# Patient Record
Sex: Female | Born: 1955 | Race: White | Hispanic: No | Marital: Married | Smoking: Never smoker
Health system: Southern US, Community
[De-identification: ages and names within clinical notes are randomized; demographics above are authoritative.]

## PROBLEM LIST (undated history)

## (undated) DIAGNOSIS — E1139 Type 2 diabetes mellitus with other diabetic ophthalmic complication: Secondary | ICD-10-CM

## (undated) DIAGNOSIS — M8580 Other specified disorders of bone density and structure, unspecified site: Secondary | ICD-10-CM

## (undated) DIAGNOSIS — R519 Headache, unspecified: Secondary | ICD-10-CM

## (undated) DIAGNOSIS — K5790 Diverticulosis of intestine, part unspecified, without perforation or abscess without bleeding: Secondary | ICD-10-CM

## (undated) DIAGNOSIS — E349 Endocrine disorder, unspecified: Secondary | ICD-10-CM

## (undated) DIAGNOSIS — E669 Obesity, unspecified: Secondary | ICD-10-CM

## (undated) DIAGNOSIS — H539 Unspecified visual disturbance: Secondary | ICD-10-CM

## (undated) DIAGNOSIS — G629 Polyneuropathy, unspecified: Secondary | ICD-10-CM

## (undated) DIAGNOSIS — N182 Chronic kidney disease, stage 2 (mild): Secondary | ICD-10-CM

## (undated) DIAGNOSIS — I10 Essential (primary) hypertension: Secondary | ICD-10-CM

## (undated) DIAGNOSIS — E119 Type 2 diabetes mellitus without complications: Secondary | ICD-10-CM

## (undated) DIAGNOSIS — K635 Polyp of colon: Secondary | ICD-10-CM

## (undated) DIAGNOSIS — G63 Polyneuropathy in diseases classified elsewhere: Secondary | ICD-10-CM

## (undated) DIAGNOSIS — K589 Irritable bowel syndrome without diarrhea: Secondary | ICD-10-CM

## (undated) DIAGNOSIS — R51 Headache: Secondary | ICD-10-CM

## (undated) DIAGNOSIS — H47099 Other disorders of optic nerve, not elsewhere classified, unspecified eye: Secondary | ICD-10-CM

## (undated) DIAGNOSIS — F329 Major depressive disorder, single episode, unspecified: Secondary | ICD-10-CM

## (undated) HISTORY — DX: Polyneuropathy, unspecified: G62.9

## (undated) HISTORY — DX: Irritable bowel syndrome, unspecified: K58.9

## (undated) HISTORY — DX: Type 2 diabetes mellitus with other diabetic ophthalmic complication: E11.39

## (undated) HISTORY — DX: Chronic kidney disease, stage 2 (mild): N18.2

## (undated) HISTORY — DX: Headache, unspecified: R51.9

## (undated) HISTORY — DX: Headache: R51

## (undated) HISTORY — DX: Polyp of colon: K63.5

## (undated) HISTORY — DX: Other specified disorders of bone density and structure, unspecified site: M85.80

## (undated) HISTORY — DX: Diverticulosis of intestine, part unspecified, without perforation or abscess without bleeding: K57.90

## (undated) HISTORY — DX: Other disorders of optic nerve, not elsewhere classified, unspecified eye: H47.099

## (undated) HISTORY — DX: Endocrine disorder, unspecified: E34.9

## (undated) HISTORY — DX: Unspecified visual disturbance: H53.9

## (undated) HISTORY — DX: Polyneuropathy in diseases classified elsewhere: G63

## (undated) HISTORY — PX: OTHER SURGICAL HISTORY: SHX169

---

## 2002-03-31 ENCOUNTER — Ambulatory Visit (HOSPITAL_COMMUNITY): Admission: RE | Admit: 2002-03-31 | Discharge: 2002-03-31 | Payer: Self-pay | Admitting: Family Medicine

## 2002-03-31 ENCOUNTER — Encounter: Payer: Self-pay | Admitting: Family Medicine

## 2002-04-24 ENCOUNTER — Emergency Department (HOSPITAL_COMMUNITY): Admission: EM | Admit: 2002-04-24 | Discharge: 2002-04-25 | Payer: Self-pay | Admitting: Emergency Medicine

## 2002-04-25 ENCOUNTER — Encounter: Payer: Self-pay | Admitting: Emergency Medicine

## 2004-03-25 ENCOUNTER — Emergency Department (HOSPITAL_COMMUNITY): Admission: EM | Admit: 2004-03-25 | Discharge: 2004-03-25 | Payer: Self-pay | Admitting: Emergency Medicine

## 2004-03-25 ENCOUNTER — Inpatient Hospital Stay (HOSPITAL_COMMUNITY): Admission: EM | Admit: 2004-03-25 | Discharge: 2004-04-05 | Payer: Self-pay | Admitting: Psychiatry

## 2004-03-25 ENCOUNTER — Ambulatory Visit: Payer: Self-pay | Admitting: Psychiatry

## 2004-03-30 ENCOUNTER — Emergency Department (HOSPITAL_COMMUNITY): Admission: EM | Admit: 2004-03-30 | Discharge: 2004-03-31 | Payer: Self-pay

## 2007-08-09 ENCOUNTER — Emergency Department (HOSPITAL_COMMUNITY): Admission: EM | Admit: 2007-08-09 | Discharge: 2007-08-10 | Payer: Self-pay | Admitting: Emergency Medicine

## 2009-06-10 ENCOUNTER — Encounter: Admission: RE | Admit: 2009-06-10 | Discharge: 2009-06-10 | Payer: Self-pay | Admitting: Family Medicine

## 2009-06-21 ENCOUNTER — Encounter: Admission: RE | Admit: 2009-06-21 | Discharge: 2009-06-21 | Payer: Self-pay | Admitting: Family Medicine

## 2010-01-25 ENCOUNTER — Encounter: Admission: RE | Admit: 2010-01-25 | Discharge: 2010-01-25 | Payer: Self-pay | Admitting: Family Medicine

## 2010-08-11 ENCOUNTER — Other Ambulatory Visit: Payer: Self-pay | Admitting: Family Medicine

## 2010-08-11 DIAGNOSIS — N6310 Unspecified lump in the right breast, unspecified quadrant: Secondary | ICD-10-CM

## 2010-08-30 ENCOUNTER — Ambulatory Visit
Admission: RE | Admit: 2010-08-30 | Discharge: 2010-08-30 | Disposition: A | Discharge status: Home / Self Care | Payer: No Typology Code available for payment source | Source: Ambulatory Visit | Attending: Family Medicine | Admitting: Family Medicine

## 2010-08-30 DIAGNOSIS — N6310 Unspecified lump in the right breast, unspecified quadrant: Secondary | ICD-10-CM

## 2010-12-01 NOTE — Discharge Summary (Signed)
[name], [name] NO.:  [id number]   MEDICAL RECORD NO.:  [id number]          PATIENT TYPE:  IPS   LOCATION:  0306                          FACILITY:  [facility]   PHYSICIAN:  Jeanice Lim, M.D. DATE OF BIRTH:  04/13/1956   DATE OF ADMISSION:  03/25/2004  DATE OF DISCHARGE:  04/05/2004                                 DISCHARGE SUMMARY   IDENTIFYING DATA:  This is a 55 year old married Caucasian female,  voluntarily admitted, crying, reporting that she would never hurt herself  but felt so bad. Husband had had another affair. They are in bankruptcy.  Oldest son, age 6, just had a baby with a 79 year old girl. Multiple  psychosocial stressors. Went to primary care physician in [facility], followed by Dr. Andrey Campanile. Dr. Duanne Guess was working yesterday. Effexor  was increased. The patient admitted to feeling helpless, hopeless, and  worthless. The patient reported some suicidal thoughts, and she was referred  for admission. The patient had no prior inpatient psychiatric treatment. Had  been prescribed antidepressants a couple of times since age 21 but quit them  before getting response.   ALLERGIES:  RELAFEN.   PHYSICAL EXAMINATION:  NEUROLOGICAL:  Essentially within normal limits.   MENTAL STATUS EXAM:  Alert and oriented, somewhat unkempt, obese, normal  gait, good eye contact. Speech within normal limits. Mood depressed but  labile. Openly crying. Thought processes revealed no delusions or paranoia,  feeling betrayed. Concentration intact. Judgment and insight intact.  Intelligence average. Denied suicidal or homicidal ideation. Denied auditory  visual hallucinations.   ADMISSION DIAGNOSES:   AXIS I:  Major depressive disorder versus adjustment disorder with mixed  emotion.   AXIS II:  Deferred.   AXIS III:  1.  Hypertension.  2.  Obesity.  3.  Probable metabolic syndrome.  4.  Probably perimenopausal.   AXIS IV:  Severe problems with  primary support group and financial stress.   AXIS V:  30/60.   HOSPITAL COURSE:  The patient was admitted and ordered routine p.r.n.  medications and underwent further monitoring. Encouraged to participate in  individual, group, and milieu therapy. Was placed on safety checks and  resumed on Effexor and Xanax and medical medications. Effexor was decreased  to 150 mg which was still an increase just prior to admission, and Xanax was  adjusted to control acute anxiety. The patient was recommended for family  meeting. Husband  and affair appear to be trigger. The patient clearly  needed individual therapy, psychiatry followup, and possible  marital  counseling. Family session was held. The patient required p.r.n. Xanax for  severe anxiety. Lamictal was then started after clear history of the bottom  dropping out with the mood instability and recurrent depression and possible  mixed symptoms as well as Risperdal for generalized suspiciousness. The  patient reported wanting to die, was able to contract for safety, reported  continued suicidal ideation with urge to die. The patient required one to  one for suicidal ideation, would not contract yesterday. Interactions with  husband appeared to worsen patient's reactivity and feeling of  desperateness. The  patient felt very hurt and difficulty with moving on  after husband's past affair and then repeated breaking of trust. The patient  gradually showed improvement in mood, sleep, and more appropriate behavior,  participating more in treatment, able to problem solve and was able to focus  more on getting herself stable and healthy and then working on marital  issues. The patient tolerated the medication changes well, showed clear  improvement, and was discharged with a euthymic mood, brighter affect, no  dangerous ideation, no psychotic symptoms. Motivation to focus on problem  solving and getting herself healthy and stable, then making bigger  decisions  regarding marriage.   The patient was discharged in improved condition with no dangerous ideations  or psychotic symptoms on  1.  Tarka 2/180 one daily.  2.  Effexor XR 150 mg q.a.m.  3.  Xanax 0.25 mg 1 at 9, 4, and before bedtime.  4.  __________ 5 mg 1 at 9 p.m.   DISPOSITION:  The patient had been scheduled for a Cardiolite stress test on  September 19, seen by internal medicine and was advised not to eat or drink  starting at midnight. The patient again was discharged in improved condition  with no risk issues.   DISCHARGE DIAGNOSES:   AXIS I:  Major depressive disorder versus adjustment disorder with mixed  emotion.   AXIS II:  Deferred.   AXIS III:  1.  Hypertension.  2.  Obesity.  3.  Probable metabolic syndrome.  4.  Probably perimenopausal.   AXIS IV:  Severe problems with primary support group and financial stress.   AXIS V:  55.     [name]   JEM/MEDQ  D:  05/01/2004  T:  05/02/2004  Job:  [id number]

## 2010-12-01 NOTE — H&P (Signed)
NAME:  [name], [name]                        ACCOUNT NO.:  [id number]   MEDICAL RECORD NO.:  [id number]                   PATIENT TYPE:  IPS   LOCATION:  0306                                 FACILITY:  [facility]   PHYSICIAN:  Geoffery Lyons, M.D.                   DATE OF BIRTH:  Jun 06, 1956   DATE OF ADMISSION:  03/25/2004  DATE OF DISCHARGE:                         PSYCHIATRIC ADMISSION ASSESSMENT   IDENTIFYING INFORMATION:  This is a 55 year old married white female.  She  is here on a voluntary basis.  She is crying.  She says she never would have  hurt herself, just felt so bad.  Her husband had had another affair.  They  are in bankruptcy.  Her oldest son, age 6, just had a baby with a 15-year-  old girl, etc.  Apparently yesterday she went to her primary care Tambra Muller,  [facility], where ordinarily she is followed by Dr. Andrey Campanile,  however Dr. Duanne Guess was on yesterday, and while collaborating to increase her  Effexor XR 75 mg to 150 mg p.o. daily the patient admitted feeling hopeless,  helpless, worthless, and amenable to doing something to harm herself.  Dr.  Duanne Guess became concerned, told the patient that if she did not sign in  voluntarily she would take papers out on her.  The patient then decided to  come in.  Apparently since becoming 41ish the patient has had several trials  of antidepressants.  She states that in the past she has taken Zoloft and  Prozac and again for similar, very distressing life situations.  An 8-year-  old niece died of cancer, her father was ill, a variety of life stressors  knock her off balance.  Apparently after beginning to respond to these prior  agents, she would stop taking them.  Currently her sleep is not good and she  is also reporting panic attacks when she tries to go to bed at night.   PAST PSYCHIATRIC HISTORY:  She has had no actual inpatient treatment.  As  already stated, she has been prescribed antidepressants a couple of  times  since age 21, however she would quit them before attaining full benefit.   SOCIAL HISTORY:  She graduated high school, she worked in an M.D.'s office  for 10 years.  She has not been employed since 1997.  She has a 67 year old  son, a 16 year old daughter, and a 33 year old son.   FAMILY HISTORY:  Her father and several other family members are all on  antidepressants.   ALCOHOL AND DRUG ABUSE:  She has no history for this.  Her urine drug screen  was positive for benzodiazepines.  She is prescribed Xanax.  Her urine  alcohol was negative.   PAST MEDICAL HISTORY:  Primary care Icarus Partch is Dr. Andrey Campanile and now Dr. Duanne Guess  at [facility]  She is followed for hypertension.  She is  prescribed Tarka 2/180  p.o. daily and Effexor XR 75 mg p.o. daily.  She was  also prescribed Xanax 0.5 mg t.i.d. although she was only taking it the hour  of sleep for panic attacks.   ALLERGIES:  RELAFEN, she gets hives.   POSITIVE PHYSICAL FINDINGS:  PHYSICAL EXAMINATION:  This is an obese white  female.  The remainder of her physical examination is well documented from  the ER and on the chart.   MENTAL STATUS EXAM:  She is alert and oriented x3.  She is somewhat unkempt,  obese, normal gait and motor, good eye contact.  Her speech is a normal  rate, rhythm and tone.  Her mood is depressed.  She is labile.  Her affect  is sad.  She is openly crying.  Thought process revealed no delusions or  paranoia.  She does feel betrayed however by her husband's affair.  Concentration and memory are intact.  Judgment and insight are intact.  Intelligence is average.  She denies suicidal or homicidal ideation.  She  denies auditory or visual hallucinations.  She does acknowledge that her  sleep is not good.   ADMISSION DIAGNOSES:   AXIS I:  Major depressive disorder versus adjustment disorder.   AXIS II:  Deferred.   AXIS III:  Hypertension, obesity, probable metabolic syndrome, probably  peri  menopausal.   AXIS IV:  Severe.  Problems with primary support group and finances.   AXIS V:  Global assessment of function is 30.Marland Kitchen   PLAN:  Plan is to admit for stabilization, to optimize her medications,  identify a therapist post discharge and to get a woman's health exam  scheduled for her.     Mickie Leonarda Salon, P.A.-C.               Geoffery Lyons, M.D.    MD/MEDQ  D:  03/26/2004  T:  03/26/2004  Job:  [id number]

## 2011-10-17 ENCOUNTER — Other Ambulatory Visit: Payer: Self-pay | Admitting: Family Medicine

## 2011-10-17 DIAGNOSIS — Z1231 Encounter for screening mammogram for malignant neoplasm of breast: Secondary | ICD-10-CM

## 2011-11-15 ENCOUNTER — Ambulatory Visit
Admission: RE | Admit: 2011-11-15 | Discharge: 2011-11-15 | Disposition: A | Discharge status: Home / Self Care | Payer: PRIVATE HEALTH INSURANCE | Source: Ambulatory Visit | Attending: Family Medicine | Admitting: Family Medicine

## 2011-11-15 DIAGNOSIS — Z1231 Encounter for screening mammogram for malignant neoplasm of breast: Secondary | ICD-10-CM

## 2013-05-10 ENCOUNTER — Emergency Department (HOSPITAL_COMMUNITY): Payer: Managed Care, Other (non HMO)

## 2013-05-10 ENCOUNTER — Encounter (HOSPITAL_COMMUNITY): Payer: Self-pay | Admitting: Internal Medicine

## 2013-05-10 ENCOUNTER — Inpatient Hospital Stay (HOSPITAL_COMMUNITY)
Admission: EM | Admit: 2013-05-10 | Discharge: 2013-05-12 | DRG: 303 | Disposition: A | Discharge status: Home / Self Care | Payer: Managed Care, Other (non HMO) | Attending: Internal Medicine | Admitting: Internal Medicine

## 2013-05-10 DIAGNOSIS — Z8249 Family history of ischemic heart disease and other diseases of the circulatory system: Secondary | ICD-10-CM

## 2013-05-10 DIAGNOSIS — Z833 Family history of diabetes mellitus: Secondary | ICD-10-CM

## 2013-05-10 DIAGNOSIS — Z79899 Other long term (current) drug therapy: Secondary | ICD-10-CM

## 2013-05-10 DIAGNOSIS — F3289 Other specified depressive episodes: Secondary | ICD-10-CM | POA: Diagnosis present

## 2013-05-10 DIAGNOSIS — E669 Obesity, unspecified: Secondary | ICD-10-CM | POA: Diagnosis present

## 2013-05-10 DIAGNOSIS — E1140 Type 2 diabetes mellitus with diabetic neuropathy, unspecified: Secondary | ICD-10-CM | POA: Diagnosis present

## 2013-05-10 DIAGNOSIS — R001 Bradycardia, unspecified: Secondary | ICD-10-CM

## 2013-05-10 DIAGNOSIS — E119 Type 2 diabetes mellitus without complications: Secondary | ICD-10-CM

## 2013-05-10 DIAGNOSIS — I498 Other specified cardiac arrhythmias: Secondary | ICD-10-CM | POA: Diagnosis not present

## 2013-05-10 DIAGNOSIS — E1142 Type 2 diabetes mellitus with diabetic polyneuropathy: Secondary | ICD-10-CM | POA: Diagnosis present

## 2013-05-10 DIAGNOSIS — I959 Hypotension, unspecified: Secondary | ICD-10-CM

## 2013-05-10 DIAGNOSIS — R55 Syncope and collapse: Secondary | ICD-10-CM | POA: Diagnosis present

## 2013-05-10 DIAGNOSIS — R079 Chest pain, unspecified: Secondary | ICD-10-CM | POA: Diagnosis present

## 2013-05-10 DIAGNOSIS — E785 Hyperlipidemia, unspecified: Secondary | ICD-10-CM | POA: Diagnosis present

## 2013-05-10 DIAGNOSIS — Z860100 Personal history of colon polyps, unspecified: Secondary | ICD-10-CM

## 2013-05-10 DIAGNOSIS — E1149 Type 2 diabetes mellitus with other diabetic neurological complication: Secondary | ICD-10-CM | POA: Diagnosis present

## 2013-05-10 DIAGNOSIS — Z6838 Body mass index (BMI) 38.0-38.9, adult: Secondary | ICD-10-CM

## 2013-05-10 DIAGNOSIS — F32A Depression, unspecified: Secondary | ICD-10-CM | POA: Insufficient documentation

## 2013-05-10 DIAGNOSIS — I951 Orthostatic hypotension: Secondary | ICD-10-CM | POA: Diagnosis not present

## 2013-05-10 DIAGNOSIS — I129 Hypertensive chronic kidney disease with stage 1 through stage 4 chronic kidney disease, or unspecified chronic kidney disease: Secondary | ICD-10-CM | POA: Diagnosis present

## 2013-05-10 DIAGNOSIS — I2510 Atherosclerotic heart disease of native coronary artery without angina pectoris: Principal | ICD-10-CM | POA: Diagnosis present

## 2013-05-10 DIAGNOSIS — E860 Dehydration: Secondary | ICD-10-CM | POA: Diagnosis present

## 2013-05-10 DIAGNOSIS — F329 Major depressive disorder, single episode, unspecified: Secondary | ICD-10-CM | POA: Diagnosis present

## 2013-05-10 DIAGNOSIS — Z7982 Long term (current) use of aspirin: Secondary | ICD-10-CM

## 2013-05-10 DIAGNOSIS — I249 Acute ischemic heart disease, unspecified: Secondary | ICD-10-CM

## 2013-05-10 DIAGNOSIS — I10 Essential (primary) hypertension: Secondary | ICD-10-CM | POA: Diagnosis present

## 2013-05-10 DIAGNOSIS — N182 Chronic kidney disease, stage 2 (mild): Secondary | ICD-10-CM | POA: Diagnosis present

## 2013-05-10 DIAGNOSIS — Z806 Family history of leukemia: Secondary | ICD-10-CM

## 2013-05-10 DIAGNOSIS — Z823 Family history of stroke: Secondary | ICD-10-CM

## 2013-05-10 DIAGNOSIS — E876 Hypokalemia: Secondary | ICD-10-CM | POA: Diagnosis present

## 2013-05-10 DIAGNOSIS — I200 Unstable angina: Secondary | ICD-10-CM | POA: Diagnosis present

## 2013-05-10 HISTORY — DX: Depression, unspecified: F32.A

## 2013-05-10 HISTORY — DX: Essential (primary) hypertension: I10

## 2013-05-10 HISTORY — DX: Type 2 diabetes mellitus without complications: E11.9

## 2013-05-10 HISTORY — DX: Obesity, unspecified: E66.9

## 2013-05-10 HISTORY — DX: Major depressive disorder, single episode, unspecified: F32.9

## 2013-05-10 LAB — POCT I-STAT TROPONIN I: Troponin i, poc: 0 ng/mL (ref 0.00–0.08)

## 2013-05-10 LAB — CBC WITH DIFFERENTIAL/PLATELET
Eosinophils Absolute: 0.2 10*3/uL (ref 0.0–0.7)
HCT: 40.8 % (ref 36.0–46.0)
Hemoglobin: 14.2 g/dL (ref 12.0–15.0)
Lymphs Abs: 2.3 10*3/uL (ref 0.7–4.0)
MCH: 30.5 pg (ref 26.0–34.0)
MCHC: 34.8 g/dL (ref 30.0–36.0)
MCV: 87.7 fL (ref 78.0–100.0)
Monocytes Absolute: 0.7 10*3/uL (ref 0.1–1.0)
Monocytes Relative: 8 % (ref 3–12)
Neutrophils Relative %: 60 % (ref 43–77)
RBC: 4.65 MIL/uL (ref 3.87–5.11)

## 2013-05-10 LAB — COMPREHENSIVE METABOLIC PANEL
Alkaline Phosphatase: 84 U/L (ref 39–117)
BUN: 14 mg/dL (ref 6–23)
Chloride: 103 mEq/L (ref 96–112)
Creatinine, Ser: 0.97 mg/dL (ref 0.50–1.10)
GFR calc Af Amer: 74 mL/min — ABNORMAL LOW (ref >90)
Glucose, Bld: 114 mg/dL — ABNORMAL HIGH (ref 70–99)
Potassium: 3 mEq/L — ABNORMAL LOW (ref 3.5–5.1)
Total Bilirubin: 0.1 mg/dL — ABNORMAL LOW (ref 0.3–1.2)
Total Protein: 6.4 g/dL (ref 6.0–8.3)

## 2013-05-10 LAB — POCT I-STAT, CHEM 8
Hemoglobin: 14.3 g/dL (ref 12.0–15.0)
Potassium: 3 mEq/L — ABNORMAL LOW (ref 3.5–5.1)
Sodium: 142 mEq/L (ref 135–145)
TCO2: 25 mmol/L (ref 0–100)

## 2013-05-10 MED ORDER — ACETAMINOPHEN 325 MG PO TABS: 650.0000 mg | ORAL_TABLET | ORAL | Status: DC | PRN

## 2013-05-10 MED ORDER — METOPROLOL TARTRATE 12.5 MG HALF TABLET
12.5000 mg | ORAL_TABLET | Freq: Two times a day (BID) | ORAL | Status: DC
  Administered 2013-05-11: 12.5 mg via ORAL
  Filled 2013-05-10 (×3): qty 1

## 2013-05-10 MED ORDER — HEPARIN (PORCINE) IN NACL 100-0.45 UNIT/ML-% IJ SOLN: 1000.0000 [IU]/h | INTRAMUSCULAR | Status: DC

## 2013-05-10 MED ORDER — ONDANSETRON HCL 4 MG/2ML IJ SOLN
4.0000 mg | Freq: Once | INTRAMUSCULAR | Status: AC
  Administered 2013-05-10: 4 mg via INTRAVENOUS
  Filled 2013-05-10: qty 2

## 2013-05-10 MED ORDER — HEPARIN (PORCINE) IN NACL 100-0.45 UNIT/ML-% IJ SOLN
1300.0000 [IU]/h | INTRAMUSCULAR | Status: DC
  Administered 2013-05-10: 1100 [IU]/h via INTRAVENOUS
  Filled 2013-05-10 (×2): qty 250

## 2013-05-10 MED ORDER — PANTOPRAZOLE SODIUM 40 MG PO TBEC
40.0000 mg | DELAYED_RELEASE_TABLET | Freq: Every day | ORAL | Status: DC
  Administered 2013-05-11 – 2013-05-12 (×2): 40 mg via ORAL
  Filled 2013-05-10 (×2): qty 1

## 2013-05-10 MED ORDER — POTASSIUM CHLORIDE CRYS ER 20 MEQ PO TBCR
40.0000 meq | EXTENDED_RELEASE_TABLET | Freq: Once | ORAL | Status: AC
  Administered 2013-05-10: 40 meq via ORAL
  Filled 2013-05-10: qty 2

## 2013-05-10 MED ORDER — ASPIRIN 300 MG RE SUPP: 300.0000 mg | RECTAL | Status: AC

## 2013-05-10 MED ORDER — ASPIRIN 81 MG PO CHEW
324.0000 mg | CHEWABLE_TABLET | ORAL | Status: AC
  Administered 2013-05-11: 324 mg via ORAL
  Filled 2013-05-10: qty 4

## 2013-05-10 MED ORDER — HEPARIN BOLUS VIA INFUSION
4000.0000 [IU] | Freq: Once | INTRAVENOUS | Status: AC
  Administered 2013-05-10: 4000 [IU] via INTRAVENOUS
  Filled 2013-05-10: qty 4000

## 2013-05-10 MED ORDER — INSULIN ASPART 100 UNIT/ML ~~LOC~~ SOLN
0.0000 [IU] | SUBCUTANEOUS | Status: DC
  Administered 2013-05-11 (×3): 1 [IU] via SUBCUTANEOUS

## 2013-05-10 MED ORDER — ASPIRIN EC 81 MG PO TBEC
81.0000 mg | DELAYED_RELEASE_TABLET | Freq: Every day | ORAL | Status: DC
  Administered 2013-05-11 – 2013-05-12 (×2): 81 mg via ORAL
  Filled 2013-05-10 (×2): qty 1

## 2013-05-10 MED ORDER — MORPHINE SULFATE 4 MG/ML IJ SOLN
4.0000 mg | Freq: Once | INTRAMUSCULAR | Status: AC
  Administered 2013-05-10: 4 mg via INTRAVENOUS
  Filled 2013-05-10: qty 1

## 2013-05-10 MED ORDER — NITROGLYCERIN 0.4 MG SL SUBL
0.4000 mg | SUBLINGUAL_TABLET | SUBLINGUAL | Status: DC | PRN
  Administered 2013-05-11 (×2): 0.4 mg via SUBLINGUAL
  Filled 2013-05-10: qty 25

## 2013-05-10 MED ORDER — ONDANSETRON HCL 4 MG/2ML IJ SOLN: 4.0000 mg | Freq: Four times a day (QID) | INTRAMUSCULAR | Status: DC | PRN

## 2013-05-10 MED ORDER — VENLAFAXINE HCL ER 225 MG PO TB24
225.0000 mg | ORAL_TABLET | Freq: Every day | ORAL | Status: DC
  Filled 2013-05-10 (×2): qty 1

## 2013-05-10 MED ORDER — LISINOPRIL 10 MG PO TABS
10.0000 mg | ORAL_TABLET | Freq: Every day | ORAL | Status: DC
  Filled 2013-05-10: qty 1

## 2013-05-10 NOTE — ED Notes (Signed)
Per EMS: Patient reports CP that started approx. 2 hours ago. Pt rated pain 10/10. Took 4 baby asa at home. EMS aministered 1 NTG, pain down to 4/10. Ax4, NAD. Vitals Stable. Initial BP 180/94, currently 114/73.

## 2013-05-10 NOTE — Progress Notes (Signed)
ANTICOAGULATION CONSULT NOTE - Initial Consult  Pharmacy Consult for Heparin Indication: chest pain/ACS  Allergies  Allergen Reactions  . Relafen [Nabumetone]     Trouble breathing, break out  . Restasis [Cyclosporine]     Eyes burn and itch    Patient Measurements:   Heparin Dosing Weight:  83.4 kg  Vital Signs: Temp: 98.2 F (36.8 C) (10/26 1943) Temp src: Oral (10/26 1943) BP: 133/62 mmHg (10/26 2000) Pulse Rate: 67 (10/26 2000)  Labs:  Recent Labs  05/10/13 1940 05/10/13 1946  HGB 14.2 14.3  HCT 40.8 42.0  PLT 226  --   CREATININE 0.97 1.30*    CrCl is unknown because there is no height on file for the current visit.   Medical History:   Medications:  Tylenol, lisinopril/HCTZ, metformin, saxagliptin, Effexor  Assessment: 57 y/o F with FH of premature CAD developed CP while painting cabinets. PMH significant for +FH, DM, and HTN. K=3 low, Baseline CBC WNL with Hgb 14.2 and Platelets 226. No h/o bleeding complications noted  Goal of Therapy:  Heparin level 0.3-0.7 units/ml Monitor platelets by anticoagulation protocol: Yes   Plan:  Heparin 4000 unit IV bolus ok as ordered by MD Heparin infusion 1100 units/hr Check heparin level and CBC daily.    Breckyn Ticas S. Merilynn Finland, PharmD, BCPS Clinical Staff Pharmacist Pager [phone]  Misty Stanley Stillinger 05/10/2013,9:11 PM

## 2013-05-10 NOTE — Consult Note (Signed)
Cardiology Consult Note  Admit date: 05/10/2013 Name: [name] 57 y.o.  female DOB:  27-Apr-1956 MRN:  [id number]  Today's date:  05/10/2013  Referring Physician:   Dr. Adela Glimpse  Primary Physician:   Dr. Benedetto Goad  Reason for Consultation:   Prolonged chest pain  IMPRESSIONS: 1. Prolonged ischemic sounding chest pain occurring at rest in a patient with multiple cardiovascular risk factors and a previous strongly positive family history of cardiac disease but with a nondiagnostic EKG and normal enzymes thus far 2. Type 2 diabetes with neuropathy 3. Hypertension 4. Obesity 5. History of colon polyps with possible bleeding in the past 6. History of depression  RECOMMENDATION: 1. Cycle cardiac enzymes and continue heparin 2. Keep n.p.o. after midnight for cardiac testing in the morning. In light of her suggestive history, multiple risk factors and his early positive family history a be a candidate for early catheterization versus CT angiography of the chest however her obesity may obviate this in her. The other possibility would be myocardial perfusion scannmg 3. Go ahead and start lipid-lowering therapy and in light of her increased cardiovascular risk she likely would need to go ahead and take a statin anyway  HISTORY: This very nice 57 year-old female has a history of hypertension non-insulin-dependent diabetes mellitus with neuropathy and a history of depression as well as obesity. She and her husband were painting tonight and she had the onset of weakness and some sweating and checked her blood sugar and found to be 130. She then became somewhat nauseated and then began to have mid sternal chest discomfort described as pressure and heaviness. She vomited became weak and passed out briefly and then continued to have significant substernal chest discomfort and was brought to the emergency room by ambulance. She was given some nitroglycerin that resulted in improvement of the pain and  she complained of very minimal residual pain now. EKG was nondiagnostic and it should troponin was negative. She is having minimal chest pain at the present time. Total duration of the discomfort was around 2 hours and was assisted with shortness of breath and some mild sweating. She had significant nausea with it as well as vomiting. In addition she has had some neck pain but this has been somewhat atypical.  Past Medical History  Diagnosis Date  . Diabetes mellitus without complication   . Hypertension   . Obesity (BMI 30-39.9)   . Depression 05/10/2013      Past Surgical History  Procedure Laterality Date  . Arm surgery Right      Allergies:  is allergic to relafen and restasis.   Medications: Prior to Admission medications   Medication Sig Start Date End Date Taking? Authorizing Provider  acetaminophen (TYLENOL) 650 MG CR tablet Take 1,300 mg by mouth every 8 (eight) hours as needed for pain.   Yes Historical Provider, MD  lisinopril-hydrochlorothiazide (PRINZIDE,ZESTORETIC) 10-12.5 MG per tablet Take 1 tablet by mouth daily.   Yes Historical Provider, MD  metFORMIN (GLUCOPHAGE-XR) 500 MG 24 hr tablet Take 1,000 mg by mouth every evening.   Yes Historical Provider, MD  saxagliptin HCl (ONGLYZA) 5 MG TABS tablet Take 5 mg by mouth daily.   Yes Historical Provider, MD  Venlafaxine HCl 225 MG TB24 Take 225 mg by mouth daily.   Yes Historical Provider, MD    Family History: Family Status  Relation Status Death Age  . Mother Alive   . Father Alive     Social History:   reports that  she has never smoked. She has never used smokeless tobacco. She reports that she does not drink alcohol or use illicit drugs.   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 Married for 38 years. Husband is a Teaching laboratory technician    Review of Systems: She is been obese for many years. She has significant peripheral neuropathy of her lower legs. She has a  history of plantar fasciitis and had surgery on her left leg earlier in the year. She had some nausea earlier. She has had a history of colon polyps removed for bleeding several years ago and is due to have a followup colonoscopy for precancerous polyps. Other than as noted above the remainder of the review of system is unremarkable.  Physical Exam: BP 116/54  Pulse 69  Temp(Src) 98.2 F (36.8 C) (Oral)  Resp 18  Ht 5\' 6"  (1.676 m)  Wt 97.523 kg (215 lb)  BMI 34.72 kg/m2  SpO2 92%  General appearance: Pleasant obese female who is currently in no acute distress Head: Normocephalic, without obvious abnormality, atraumatic Eyes: conjunctivae/corneas clear. PERRL, EOM's intact. Fundi not examined  Neck: no adenopathy, no carotid bruit, no JVD and supple, symmetrical, trachea midline Lungs: clear to auscultation bilaterally Heart: regular rate and rhythm, S1, S2 normal, no murmur, click, rub or gallop Abdomen: soft, non-tender; bowel sounds normal; no masses,  no organomegaly Pelvic: deferred Extremities: 1+ edema of the left leg Pulses: 2+ and symmetric Neurologic: Grossly normal   Labs: CBC  Recent Labs  05/10/13 1940 05/10/13 1946  WBC 7.8  --   RBC 4.65  --   HGB 14.2 14.3  HCT 40.8 42.0  PLT 226  --   MCV 87.7  --   MCH 30.5  --   MCHC 34.8  --   RDW 13.0  --   LYMPHSABS 2.3  --   MONOABS 0.7  --   EOSABS 0.2  --   BASOSABS 0.0  --    CMP   Recent Labs  05/10/13 1940 05/10/13 1946  NA 140 142  K 3.0* 3.0*  CL 103 103  CO2 27  --   GLUCOSE 114* 111*  BUN 14 14  CREATININE 0.97 1.30*  CALCIUM 8.8  --   PROT 6.4  --   ALBUMIN 3.4*  --   AST 16  --   ALT 17  --   ALKPHOS 84  --   BILITOT 0.1*  --   GFRNONAA 64*  --   GFRAA 74*  --    BNP (last 3 results)  Cardiac Panel (last 3 results) Troponin (Point of Care Test)  Recent Labs  05/10/13 1943  TROPIPOC 0.00     Radiology: Lungs clear  EKG: Paper copy reviewed which appeared normal. No  EKG available in EPIC Signed:  W. Ashley Royalty MD [facility]   Cardiology Consultant  05/10/2013, 11:13 PM

## 2013-05-10 NOTE — ED Notes (Signed)
Admitting physician at bedside

## 2013-05-10 NOTE — ED Provider Notes (Signed)
CSN: [id number]     Arrival date & time 05/10/13  1923 History   None    Chief Complaint  Patient presents with  . Chest Pain   (Consider location/radiation/quality/duration/timing/severity/associated sxs/prior Treatment) Patient is a 57 y.o. female presenting with chest pain. The history is provided by the patient.  Chest Pain Pain location:  Substernal area and L chest Pain quality: aching, pressure and tightness   Pain radiates to:  L arm Pain radiates to the back: no   Pain severity:  Moderate Onset quality:  Sudden Duration:  2 hours Timing:  Constant Progression:  Improving Chronicity:  New Context comment:  States she has been painting cabinets in her home today but was going at a slow pace and not doing anything too strenous Relieved by:  Nothing Worsened by:  Exertion Ineffective treatments:  Rest Associated symptoms: nausea, shortness of breath and syncope   Associated symptoms: no abdominal pain, no anxiety, no back pain, no cough, no diaphoresis, not vomiting and no weakness   Risk factors: diabetes mellitus and hypertension   Risk factors: no coronary artery disease, no prior DVT/PE, no smoking and no surgery   Risk factors comment:  Brother with first MI in his 47's.  Dad with multiple MI's and stents   No past medical history on file. No past surgical history on file. No family history on file. History  Substance Use Topics  . Smoking status: Not on file  . Smokeless tobacco: Not on file  . Alcohol Use: Not on file   OB History   No data available     Review of Systems  Constitutional: Negative for diaphoresis.  Respiratory: Positive for shortness of breath. Negative for cough.   Cardiovascular: Positive for chest pain and syncope.  Gastrointestinal: Positive for nausea. Negative for vomiting and abdominal pain.  Musculoskeletal: Negative for back pain.  Neurological: Negative for weakness.  All other systems reviewed and are  negative.    Allergies  Review of patient's allergies indicates not on file.  Home Medications  No current outpatient prescriptions on file. There were no vitals taken for this visit. Physical Exam  Nursing note and vitals reviewed. Constitutional: She is oriented to person, place, and time. She appears well-developed and well-nourished. No distress.  HENT:  Head: Normocephalic and atraumatic.  Mouth/Throat: Oropharynx is clear and moist.  Eyes: Conjunctivae and EOM are normal. Pupils are equal, round, and reactive to light.  Neck: Normal range of motion. Neck supple.  Cardiovascular: Normal rate, regular rhythm and intact distal pulses.   No murmur heard. Normal pulses throughout  Pulmonary/Chest: Effort normal and breath sounds normal. No respiratory distress. She has no wheezes. She has no rales. She exhibits no tenderness.  No reproducible chest pain.  Abdominal: Soft. She exhibits no distension. There is no tenderness. There is no rebound and no guarding.  Musculoskeletal: Normal range of motion. She exhibits no edema and no tenderness.  Neurological: She is alert and oriented to person, place, and time.  Skin: Skin is warm and dry. No rash noted. No erythema.  Psychiatric: She has a normal mood and affect. Her behavior is normal.    ED Course  Procedures (including critical care time) Labs Review Labs Reviewed  COMPREHENSIVE METABOLIC PANEL - Abnormal; Notable for the following:    Potassium 3.0 (*)    Glucose, Bld 114 (*)    Albumin 3.4 (*)    Total Bilirubin 0.1 (*)    GFR calc non Af Amer 64 (*)  GFR calc Af Amer 74 (*)    All other components within normal limits  POCT I-STAT, CHEM 8 - Abnormal; Notable for the following:    Potassium 3.0 (*)    Creatinine, Ser 1.30 (*)    Glucose, Bld 111 (*)    All other components within normal limits  CBC WITH DIFFERENTIAL  POCT I-STAT TROPONIN I   Imaging Review Dg Chest Port 1 View  05/10/2013   CLINICAL DATA:   Left chest pressure.  EXAM: PORTABLE CHEST - 1 VIEW  COMPARISON:  08/10/2007  FINDINGS: The heart size and mediastinal contours are within normal limits. Both lungs are clear. The visualized skeletal structures are unremarkable.  IMPRESSION: No active disease.   Electronically Signed   By: Oley Balm M.D.   On: 05/10/2013 20:23    EKG Interpretation   None       Date: 05/10/2013  Rate: 70  Rhythm: normal sinus rhythm  QRS Axis: normal  Intervals: normal  ST/T Wave abnormalities: normal  Conduction Disutrbances: none  Narrative Interpretation: unremarkable      MDM  No diagnosis found.  Pt with symptoms concerning for ACS.  HART score  . Associated symptoms include nausea, syncope, SOB.  Low risk for PE. ASA and NTG given PTA with only minimal change in pain.  No radiation to back or concern for dissection at this time. EKG, CXR, CBC, BMP, CE, Coags pending.   9:07 PM Labs and EKG wnl.  However pain has only been for 2 hours PTa.  Given risk factors and story will admit for ACS r/o.  Also mild hypokalemia which was treated.  9:09 PM Pt with still 3/10 pain down from 8/10.  Will give morphine and started on heparin.  Gwyneth Sprout, MD 05/10/13 2110

## 2013-05-10 NOTE — H&P (Signed)
PCP: Denyse Amass at [facility]   Chief Complaint:  Chest pain  HPI: [name] is a 57 y.o. female   has a past medical history of Diabetes mellitus without complication and Hypertension.   Presented with  Patient was painting cabinets and started to feel sweaty and nauseous. She checked her blood sugar it was normal. She had a syncopal episode for 1 min. Started to have squeezing pressure in her chest radiating to right arm. This has resolved after getting nitroglycerine. Now she just have a slight sensation of discomfort. The episode lasted together around 4 hours. Her nausea has now resolved. She never had a stress test done. She has extensive family hx of early onset CAD. Hospitalist was called for admission.   Review of Systems:    Pertinent positives include: chest pain, abdominal pain, nausea,  shortness of breath at rest.   Constitutional:  No weight loss, night sweats, Fevers, chills, fatigue, weight loss  HEENT:  No headaches, Difficulty swallowing,Tooth/dental problems,Sore throat,  No sneezing, itching, ear ache, nasal congestion, post nasal drip,  Cardio-vascular:  No Orthopnea, PND, anasarca, dizziness, palpitations.no Bilateral lower extremity swelling  GI:  No heartburn, indigestion, vomiting, diarrhea, change in bowel habits, loss of appetite, melena, blood in stool, hematemesis Resp:  noNo dyspnea on exertion, No excess mucus, no productive cough, No non-productive cough, No coughing up of blood.No change in color of mucus.No wheezing. Skin:  no rash or lesions. No jaundice GU:  no dysuria, change in color of urine, no urgency or frequency. No straining to urinate.  No flank pain.  Musculoskeletal:  No joint pain or no joint swelling. No decreased range of motion. No back pain.  Psych:  No change in mood or affect. No depression or anxiety. No memory loss.  Neuro: no localizing neurological complaints, no tingling, no weakness, no double vision, no gait  abnormality, no slurred speech, no confusion  Otherwise ROS are negative except for above, 10 systems were reviewed  Past Medical History: Past Medical History  Diagnosis Date  . Diabetes mellitus without complication   . Hypertension    Past Surgical History  Procedure Laterality Date  . Arm surgery Right      Medications: Prior to Admission medications   Medication Sig Start Date End Date Taking? Authorizing Provider  acetaminophen (TYLENOL) 650 MG CR tablet Take 1,300 mg by mouth every 8 (eight) hours as needed for pain.   Yes Historical Provider, MD  lisinopril-hydrochlorothiazide (PRINZIDE,ZESTORETIC) 10-12.5 MG per tablet Take 1 tablet by mouth daily.   Yes Historical Provider, MD  metFORMIN (GLUCOPHAGE-XR) 500 MG 24 hr tablet Take 1,000 mg by mouth every evening.   Yes Historical Provider, MD  saxagliptin HCl (ONGLYZA) 5 MG TABS tablet Take 5 mg by mouth daily.   Yes Historical Provider, MD  Venlafaxine HCl 225 MG TB24 Take 225 mg by mouth daily.   Yes Historical Provider, MD    Allergies:   Allergies  Allergen Reactions  . Relafen [Nabumetone]     Trouble breathing, break out  . Restasis [Cyclosporine]     Eyes burn and itch    Social History:  Ambulatory  independently  Lives at  Home with family   reports that she has never smoked. She does not have any smokeless tobacco history on file. She reports that she does not drink alcohol or use illicit drugs.   Family History: family history includes Diabetes type II in her mother; Heart disease in her brother and father;  Hypertension in her brother, father, and mother; Leukemia in her other; Stroke in her mother.    Physical Exam: Patient Vitals for the past 24 hrs:  BP Temp Temp src Pulse Resp SpO2 Height Weight  05/10/13 2115 104/48 mmHg - - 74 23 97 % - -  05/10/13 2100 122/61 mmHg - - 83 15 97 % 5\' 6"  (1.676 m) 97.523 kg (215 lb)  05/10/13 2045 102/45 mmHg - - 68 16 99 % - -  05/10/13 2030 139/54 mmHg - -  71 15 100 % - -  05/10/13 2000 133/62 mmHg - - 67 18 100 % - -  05/10/13 1945 120/58 mmHg - - 68 17 99 % - -  05/10/13 1943 120/58 mmHg 98.2 F (36.8 C) Oral - 17 100 % - -    1. General:  in No Acute distress 2. Psychological: Alert and  Oriented 3. Head/ENT:   Moist  Mucous Membranes                          Head Non traumatic, neck supple                          Normal   Dentition 4. SKIN: normal  Skin turgor,  Skin clean Dry and intact no rash 5. Heart: Regular rate and rhythm no Murmur, Rub or gallop 6. Lungs: Clear to auscultation bilaterally, no wheezes or crackles   7. Abdomen: Soft, non-tender, Non distended 8. Lower extremities: no clubbing, cyanosis, or edema 9. Neurologically Grossly intact, moving all 4 extremities equally 10. MSK: Normal range of motion  body mass index is 34.72 kg/(m^2).   Labs on Admission:   Recent Labs  05/10/13 1940 05/10/13 1946  NA 140 142  K 3.0* 3.0*  CL 103 103  CO2 27  --   GLUCOSE 114* 111*  BUN 14 14  CREATININE 0.97 1.30*  CALCIUM 8.8  --     Recent Labs  05/10/13 1940  AST 16  ALT 17  ALKPHOS 84  BILITOT 0.1*  PROT 6.4  ALBUMIN 3.4*   No results found for this basename: LIPASE, AMYLASE,  in the last 72 hours  Recent Labs  05/10/13 1940 05/10/13 1946  WBC 7.8  --   NEUTROABS 4.7  --   HGB 14.2 14.3  HCT 40.8 42.0  MCV 87.7  --   PLT 226  --    No results found for this basename: CKTOTAL, CKMB, CKMBINDEX, TROPONINI,  in the last 72 hours No results found for this basename: TSH, T4TOTAL, FREET3, T3FREE, THYROIDAB,  in the last 72 hours No results found for this basename: VITAMINB12, FOLATE, FERRITIN, TIBC, IRON, RETICCTPCT,  in the last 72 hours No results found for this basename: HGBA1C    Estimated Creatinine Clearance: 56.9 ml/min (by C-G formula based on Cr of 1.3). ABG    Component Value Date/Time   TCO2 25 05/10/2013 1946     No results found for this basename: DDIMER     Other results:  I  have pearsonaly reviewed this: ECG REPORT  Rate: 70   Rhythm: NSR ST&T Change: no ischemic changes   Cultures: No results found for this basename: sdes, specrequest, cult, reptstatus       Radiological Exams on Admission: Dg Chest Port 1 View  05/10/2013   CLINICAL DATA:  Left chest pressure.  EXAM: PORTABLE CHEST - 1 VIEW  COMPARISON:  08/10/2007  FINDINGS: The heart size and mediastinal contours are within normal limits. Both lungs are clear. The visualized skeletal structures are unremarkable.  IMPRESSION: No active disease.   Electronically Signed   By: Oley Balm M.D.   On: 05/10/2013 20:23    Chart has been reviewed  Assessment/Plan  57 year old female with risk factors including history of hypertension diabetes and family history early onset coronary disease presents with stool was worrisome for unstable angina so far negative cardiac enzymes and unremarkable EKG  Present on Admission:  . Unstable angina - patient is not chest pain-free at this point we'll admit to step down. Have spoken to Dr. Donnie Aho with cardiology in regards to consult. We'll continue heparin drip started to emergency department. Continue cycle cardiac enzymes serial EKG we'll order echo gram to evaluate for wall motion abnormality. Will make n.p.o. for possible stress test.  . Chest pain at rest - we'll admit to telemetry monitoring cycle cardiac enzymes . Hypertension - continue home medications Will also add beta blocker withholding parameters  . Diabetic neuropathy, type II diabetes mellitus - chronic currently a symptomatic  . Diabetes mellitus - sliding scale hold metformin by mouth medications     Prophylaxis heparin drip,  Protonix  CODE STATUS: FULL CODE  Other plan as per orders.  I have spent a total of 65 min on this admission time was taken to discuss her care with cardiology  Desten Manor 05/10/2013, 10:08 PM

## 2013-05-11 ENCOUNTER — Inpatient Hospital Stay (HOSPITAL_COMMUNITY): Payer: Managed Care, Other (non HMO)

## 2013-05-11 DIAGNOSIS — R001 Bradycardia, unspecified: Secondary | ICD-10-CM

## 2013-05-11 DIAGNOSIS — R55 Syncope and collapse: Secondary | ICD-10-CM | POA: Diagnosis present

## 2013-05-11 DIAGNOSIS — N182 Chronic kidney disease, stage 2 (mild): Secondary | ICD-10-CM | POA: Diagnosis present

## 2013-05-11 DIAGNOSIS — I959 Hypotension, unspecified: Secondary | ICD-10-CM | POA: Diagnosis present

## 2013-05-11 DIAGNOSIS — R072 Precordial pain: Secondary | ICD-10-CM

## 2013-05-11 DIAGNOSIS — R079 Chest pain, unspecified: Secondary | ICD-10-CM

## 2013-05-11 LAB — GLUCOSE, CAPILLARY
Glucose-Capillary: 119 mg/dL — ABNORMAL HIGH (ref 70–99)
Glucose-Capillary: 136 mg/dL — ABNORMAL HIGH (ref 70–99)
Glucose-Capillary: 137 mg/dL — ABNORMAL HIGH (ref 70–99)
Glucose-Capillary: 138 mg/dL — ABNORMAL HIGH (ref 70–99)
Glucose-Capillary: 199 mg/dL — ABNORMAL HIGH (ref 70–99)

## 2013-05-11 LAB — BASIC METABOLIC PANEL
CO2: 25 mEq/L (ref 19–32)
Calcium: 8.6 mg/dL (ref 8.4–10.5)
Creatinine, Ser: 0.94 mg/dL (ref 0.50–1.10)
GFR calc non Af Amer: 67 mL/min — ABNORMAL LOW (ref >90)
Glucose, Bld: 125 mg/dL — ABNORMAL HIGH (ref 70–99)
Sodium: 141 mEq/L (ref 135–145)

## 2013-05-11 LAB — CBC
MCH: 30.5 pg (ref 26.0–34.0)
MCHC: 34.4 g/dL (ref 30.0–36.0)
Platelets: 208 10*3/uL (ref 150–400)
RDW: 13.2 % (ref 11.5–15.5)

## 2013-05-11 LAB — TROPONIN I
Troponin I: <0.3 ng/mL (ref <0.30)
Troponin I: <0.3 ng/mL (ref <0.30)

## 2013-05-11 LAB — LIPID PANEL
Cholesterol: 175 mg/dL (ref 0–200)
HDL: 54 mg/dL (ref >39)
LDL Cholesterol: 105 mg/dL — ABNORMAL HIGH (ref 0–99)
Total CHOL/HDL Ratio: 3.2 RATIO
Triglycerides: 79 mg/dL (ref <150)

## 2013-05-11 LAB — D-DIMER, QUANTITATIVE: D-Dimer, Quant: <0.27 ug/mL-FEU (ref 0.00–0.48)

## 2013-05-11 LAB — PROTIME-INR
INR: 0.98 (ref 0.00–1.49)
Prothrombin Time: 12.8 seconds (ref 11.6–15.2)
Prothrombin Time: 12.5 seconds (ref 11.6–15.2)

## 2013-05-11 LAB — HEMOGLOBIN A1C
Hgb A1c MFr Bld: 6.8 % — ABNORMAL HIGH (ref <5.7)
Mean Plasma Glucose: 148 mg/dL — ABNORMAL HIGH (ref <117)

## 2013-05-11 MED ORDER — NITROGLYCERIN 0.4 MG SL SUBL
SUBLINGUAL_TABLET | SUBLINGUAL | Status: AC
  Filled 2013-05-11: qty 25

## 2013-05-11 MED ORDER — SODIUM CHLORIDE 0.9 % IV BOLUS (SEPSIS)
500.0000 mL | Freq: Once | INTRAVENOUS | Status: AC
  Administered 2013-05-11: 500 mL via INTRAVENOUS

## 2013-05-11 MED ORDER — SODIUM CHLORIDE 0.9 % IV BOLUS (SEPSIS)
500.0000 mL | Freq: Once | INTRAVENOUS | Status: AC
  Administered 2013-05-11: 14:00:00 via INTRAVENOUS

## 2013-05-11 MED ORDER — OXYCODONE HCL 5 MG PO TABS
5.0000 mg | ORAL_TABLET | Freq: Four times a day (QID) | ORAL | Status: DC | PRN
  Administered 2013-05-11: 5 mg via ORAL
  Filled 2013-05-11 (×3): qty 1

## 2013-05-11 MED ORDER — METOPROLOL TARTRATE 1 MG/ML IV SOLN
INTRAVENOUS | Status: AC
  Filled 2013-05-11: qty 10

## 2013-05-11 MED ORDER — INSULIN ASPART 100 UNIT/ML ~~LOC~~ SOLN
0.0000 [IU] | Freq: Three times a day (TID) | SUBCUTANEOUS | Status: DC
  Administered 2013-05-11: 2 [IU] via SUBCUTANEOUS
  Administered 2013-05-12 (×2): 1 [IU] via SUBCUTANEOUS

## 2013-05-11 MED ORDER — SODIUM CHLORIDE 0.9 % IV SOLN
INTRAVENOUS | Status: DC
  Administered 2013-05-11 – 2013-05-12 (×2): via INTRAVENOUS

## 2013-05-11 MED ORDER — ENOXAPARIN SODIUM 40 MG/0.4ML ~~LOC~~ SOLN
40.0000 mg | Freq: Every day | SUBCUTANEOUS | Status: DC
  Administered 2013-05-11: 40 mg via SUBCUTANEOUS
  Filled 2013-05-11 (×2): qty 0.4

## 2013-05-11 MED ORDER — NITROGLYCERIN 0.4 MG SL SUBL
SUBLINGUAL_TABLET | SUBLINGUAL | Status: AC
  Filled 2013-05-11: qty 50

## 2013-05-11 MED ORDER — IOHEXOL 350 MG/ML SOLN
80.0000 mL | Freq: Once | INTRAVENOUS | Status: AC | PRN
  Administered 2013-05-11: 80 mL via INTRAVENOUS

## 2013-05-11 MED ORDER — ATORVASTATIN CALCIUM 20 MG PO TABS
20.0000 mg | ORAL_TABLET | Freq: Every day | ORAL | Status: DC
  Administered 2013-05-11: 20 mg via ORAL
  Filled 2013-05-11 (×2): qty 1

## 2013-05-11 NOTE — Progress Notes (Signed)
Patient ID: [name], female   DOB: 1956-03-17, 57 y.o.   MRN: [id number]    SUBJECTIVE: No further chest pain this morning.  She had nagging pain for about 8 hours yesterday. Troponin and ECG are negative.   Marland Kitchen aspirin EC  81 mg Oral Daily  . insulin aspart  0-9 Units Subcutaneous Q4H  . lisinopril  10 mg Oral Daily  . metoprolol tartrate  12.5 mg Oral BID  . pantoprazole  40 mg Oral Q1200  . Venlafaxine HCl  225 mg Oral Daily  heparin gtt    Filed Vitals:   05/11/13 0534 05/11/13 0600 05/11/13 0623 05/11/13 0700  BP: 87/41 78/45 94/57  92/44  Pulse: 66 58 60 56  Temp:      TempSrc:      Resp:      Height:      Weight:      SpO2: 94% 95% 99% 94%    Intake/Output Summary (Last 24 hours) at 05/11/13 0740 Last data filed at 05/11/13 0531  Gross per 24 hour  Intake    500 ml  Output    850 ml  Net   -350 ml    LABS: Basic Metabolic Panel:  Recent Labs  40/98/11 1940 05/10/13 1946 05/11/13 0409  NA 140 142 141  K 3.0* 3.0* 4.1  CL 103 103 107  CO2 27  --  25  GLUCOSE 114* 111* 125*  BUN 14 14 13   CREATININE 0.97 1.30* 0.94  CALCIUM 8.8  --  8.6   Liver Function Tests:  Recent Labs  05/10/13 1940  AST 16  ALT 17  ALKPHOS 84  BILITOT 0.1*  PROT 6.4  ALBUMIN 3.4*   No results found for this basename: LIPASE, AMYLASE,  in the last 72 hours CBC:  Recent Labs  05/10/13 1940 05/10/13 1946 05/11/13 0409  WBC 7.8  --  7.4  NEUTROABS 4.7  --   --   HGB 14.2 14.3 13.0  HCT 40.8 42.0 37.8  MCV 87.7  --  88.7  PLT 226  --  208   Cardiac Enzymes:  Recent Labs  05/11/13 0100 05/11/13 0409  TROPONINI <0.30 <0.30   BNP: No components found with this basename: POCBNP,  D-Dimer: No results found for this basename: DDIMER,  in the last 72 hours Hemoglobin A1C: No results found for this basename: HGBA1C,  in the last 72 hours Fasting Lipid Panel:  Recent Labs  05/11/13 0409  CHOL 175  HDL 54  LDLCALC 105*  TRIG 79  CHOLHDL 3.2    Thyroid Function Tests: No results found for this basename: TSH, T4TOTAL, FREET3, T3FREE, THYROIDAB,  in the last 72 hours Anemia Panel: No results found for this basename: VITAMINB12, FOLATE, FERRITIN, TIBC, IRON, RETICCTPCT,  in the last 72 hours  RADIOLOGY: Dg Chest Port 1 View  05/10/2013   CLINICAL DATA:  Left chest pressure.  EXAM: PORTABLE CHEST - 1 VIEW  COMPARISON:  08/10/2007  FINDINGS: The heart size and mediastinal contours are within normal limits. Both lungs are clear. The visualized skeletal structures are unremarkable.  IMPRESSION: No active disease.   Electronically Signed   By: Oley Balm M.D.   On: 05/10/2013 20:23    PHYSICAL EXAM General: NAD Neck: JVP 8 cm, no thyromegaly or thyroid nodule.  Lungs: Clear to auscultation bilaterally with normal respiratory effort. CV: Nondisplaced PMI.  Heart regular S1/S2, no S3/S4, no murmur.  No peripheral edema.  No carotid bruit.  Normal  pedal pulses.  Abdomen: Soft, nontender, no hepatosplenomegaly, no distention.  Neurologic: Alert and oriented x 3.  Psych: Normal affect. Extremities: No clubbing or cyanosis.   TELEMETRY: Reviewed telemetry pt in NSR in 61s  ASSESSMENT AND PLAN: 57 yo with history of HTN and diabetes presented with chest pain . 1. Chest pain: Prolonged for about 8 hours.  ECG unremarkable and cardiac enzymes have remained normal.  Suspect that if pain were ischemic enzymes would have risen, but she does have RFs for CAD.  I will arrange for coronary CT angiogram this morning.  I will also get an echocardiogram to assess cardiac wall motion.  2. Hyperlipidemia: Given diabetes, she should be on a statin.  Will start atorvastatin 20 mg daily.  3. Diabetes: Sliding scale insulin, metformin is on hold.   Marca Ancona 05/11/2013 7:44 AM

## 2013-05-11 NOTE — Progress Notes (Signed)
Notified Allison Ellis,N.P. regarding Orthostatic BP readings (See Doc. Flowsheet) for vitals.  Patient felt like she was going to "pass out" when doing the standing BP.  Patient was placed back in bed, orders were given and initiated.

## 2013-05-11 NOTE — Care Management Note (Signed)
    Page 1 of 1   05/11/2013     8:38:47 AM   CARE MANAGEMENT NOTE 05/11/2013  Patient:  [name], [name]   Account Number:  [id number]  Date Initiated:  05/11/2013  Documentation initiated by:  Junius Creamer  Subjective/Objective Assessment:   adm w ch pain     Action/Plan:   lives w wife   Anticipated DC Date:     Anticipated DC Plan:  HOME/SELF CARE      DC Planning Services  CM consult      Choice offered to / List presented to:             Status of service:   Medicare Important Message given?   (If response is "NO", the following Medicare IM given date fields will be blank) Date Medicare IM given:   Date Additional Medicare IM given:    Discharge Disposition:  HOME/SELF CARE  Per UR Regulation:  Reviewed for med. necessity/level of care/duration of stay  If discussed at Long Length of Stay Meetings, dates discussed:    Comments:

## 2013-05-11 NOTE — Progress Notes (Signed)
ANTICOAGULATION CONSULT NOTE - Follow Up Consult  Pharmacy Consult for heparin Indication: chest pain/ACS  Labs:  Recent Labs  05/10/13 1940 05/10/13 1946 05/11/13 0100 05/11/13 0409  HGB 14.2 14.3  --  13.0  HCT 40.8 42.0  --  37.8  PLT 226  --   --  208  APTT  --   --  70*  --   LABPROT  --   --  12.8 12.5  INR  --   --  0.98 0.95  HEPARINUNFRC  --   --   --  0.22*  CREATININE 0.97 1.30*  --  0.94  TROPONINI  --   --  <0.30 <0.30    Assessment: 57yo female subtherapeutic on heparin with initial dosing for CP.  Goal of Therapy:  Heparin level 0.3-0.7 units/ml   Plan:  Will increase heparin gtt by 2 units/kg/hr to 1300 units/hr and check level in 6hr.  Vernard Gambles, PharmD, BCPS  05/11/2013,5:19 AM

## 2013-05-11 NOTE — Progress Notes (Signed)
[facility] Progress Note [place] TEAM 1 - [facility] ICU Team   [name] ZOX:[id number] DOB: March 01, 1956 DOA: 05/10/2013 PCP: No primary provider on file.  Brief narrative: 57 year old female patient who developed exertional chest pain associated with diaphoresis and nausea. Apparently also had near syncopal episode at same time. Pain also radiated to the right arm. Pain resolved after nitroglycerin. Pain persisted even at rest. Patient's risk factors are diabetes and hypertension. The patient presented to the emergency department where initial EKG was negative. Initial cardiac enzymes negative. Patient endorsed constant chest pain total duration of ~4 hours. In addition to her personal risk factors this patient has extensive family history of early onset CAD.  Assessment/Plan:    Chest pain at rest -Coronary CT Angio with <50% nonobstructive CAD so Cards says treat medically -enzymes negative so have stopped Hpearin infusion -developed brady cardia and hypotension after beta blockers so was dc'd -cont statin and ASA -Suspect sx' more r/t orthostasis (see below)  -2-D echocardiogram pending    Near syncope/Hypotension -OVS positive so cont BR and give 500 cc NS bolus (has rec add'l bolus overnight) -begin IVFs at 150/hr -hold all anti HTN meds including diuretics -has chronic LLE edema so ?? PE as eiology for near syncope and hypotension >> d dimer negative    Type 2 diabetes mellitus with diabetic neuropathy -SSI for now -may need insulin while IP -hold home Metformin - has CKD so this may not ne best med for her    CKD (chronic kidney disease) stage 2, GFR 60-89 ml/min -mild worsening and likely due to [facility] -given recent hypotension/low perfusion watch for further worsening of renal function    Hypertension - now hypotensive    Bradycardia, drug induced -BB dc'd - was NOT on preadmit   DVT prophylaxis: IV heparin changed to Lovenox DVT prophy Code Status:  Full Family Communication: Patient Disposition Plan/Expected LOS: Stepdown  Consultants: Cardiology  Procedures: 2-D echocardiogram - pending  Antibiotics:  None  HPI/Subjective: Patient without complaints upon evaluation. Concerned that hasn't eaten and this may be contributing to symptoms of weakness since she is diabetic.  Objective: Blood pressure 74/49, pulse 50, temperature 97.7 F (36.5 C), temperature source Oral, resp. rate 13, height 5\' 6"  (1.676 m), weight 108.138 kg (238 lb 6.4 oz), SpO2 98.00%.  Intake/Output Summary (Last 24 hours) at 05/11/13 1343 Last data filed at 05/11/13 0531  Gross per 24 hour  Intake    500 ml  Output    850 ml  Net   -350 ml   Exam: General: No acute respiratory distress Lungs: Clear to auscultation bilaterally without wheezes or crackles, RA Cardiovascular: Regular rate and rhythm without murmur gallop or rub normal S1 and S2, no peripheral edema or JVD-blood pressure variable and orthostatic vital signs positive Abdomen: Nontender, nondistended, soft, bowel sounds positive, no rebound, no ascites, no appreciable mass Musculoskeletal: No significant cyanosis, clubbing of bilateral lower extremities Neurological: Alert and oriented x 3, moves all extremities x 4 without focal neurological deficits, CN 2-12 intact, complain of headache associated with recent low blood pressure   Scheduled Meds:  Scheduled Meds: . aspirin EC  81 mg Oral Daily  . atorvastatin  20 mg Oral q1800  . insulin aspart  0-9 Units Subcutaneous TID WC  . nitroGLYCERIN      . pantoprazole  40 mg Oral Q1200  . Venlafaxine HCl  225 mg Oral Daily   Data Reviewed: Basic Metabolic Panel:  Recent Labs Lab 05/10/13  1940 05/10/13 1946 05/11/13 0409  NA 140 142 141  K 3.0* 3.0* 4.1  CL 103 103 107  CO2 27  --  25  GLUCOSE 114* 111* 125*  BUN 14 14 13   CREATININE 0.97 1.30* 0.94  CALCIUM 8.8  --  8.6   Liver Function Tests:  Recent Labs Lab 05/10/13 1940   AST 16  ALT 17  ALKPHOS 84  BILITOT 0.1*  PROT 6.4  ALBUMIN 3.4*   CBC:  Recent Labs Lab 05/10/13 1940 05/10/13 1946 05/11/13 0409  WBC 7.8  --  7.4  NEUTROABS 4.7  --   --   HGB 14.2 14.3 13.0  HCT 40.8 42.0 37.8  MCV 87.7  --  88.7  PLT 226  --  208   Cardiac Enzymes:  Recent Labs Lab 05/11/13 0100 05/11/13 0409 05/11/13 1137  TROPONINI <0.30 <0.30 <0.30   CBG:  Recent Labs Lab 05/11/13 0019 05/11/13 0347 05/11/13 0757 05/11/13 1200  GLUCAP 136* 119* 137* 126*    Recent Results (from the past 240 hour(s))  MRSA PCR SCREENING     Status: None   Collection Time    05/10/13 11:22 PM      Result Value Range Status   MRSA by PCR NEGATIVE  NEGATIVE Final   Comment:            The GeneXpert MRSA Assay (FDA     approved for NASAL specimens     only), is one component of a     comprehensive MRSA colonization     surveillance program. It is not     intended to diagnose MRSA     infection nor to guide or     monitor treatment for     MRSA infections.     Studies:  Recent x-ray studies have been reviewed in detail by the Attending Physician     Junious Silk, ANP [facility] Office  [phone] Pager [phone]  **If unable to reach the above provider after paging please contact the Flow Manager @ [phone]  On-Call/Text Page:      [email or web address]      password [email or web address]  If 7PM-7AM, please contact night-coverage [email or web address] Password [email or web address] 05/11/2013, 1:43 PM   LOS: 1 day   I have personally examined this patient and reviewed the entire database. I have reviewed the above note, made any necessary editorial changes, and agree with its content.  Lonia Blood, MD [facility]

## 2013-05-11 NOTE — Progress Notes (Signed)
Upon arrival to floor pt admitted to left chest aching pressure with radiation to left arm.  This resolved after SL NTG X2.  Subsequent blood pressures have been low with SBP in 80's and as low as 78 while asleep.  Her BP comes up to the 90's when aroused and moving around.  Hospitalist notified and 500 cc fluid bolus given x2.

## 2013-05-11 NOTE — Progress Notes (Signed)
  Echocardiogram 2D Echocardiogram has been performed.  Prestyn Mahn 05/11/2013, 4:36 PM

## 2013-05-12 DIAGNOSIS — Z5189 Encounter for other specified aftercare: Secondary | ICD-10-CM

## 2013-05-12 DIAGNOSIS — I959 Hypotension, unspecified: Secondary | ICD-10-CM

## 2013-05-12 DIAGNOSIS — N182 Chronic kidney disease, stage 2 (mild): Secondary | ICD-10-CM

## 2013-05-12 LAB — BASIC METABOLIC PANEL
BUN: 15 mg/dL (ref 6–23)
CO2: 24 mEq/L (ref 19–32)
Calcium: 8.3 mg/dL — ABNORMAL LOW (ref 8.4–10.5)
Chloride: 106 mEq/L (ref 96–112)
Creatinine, Ser: 0.88 mg/dL (ref 0.50–1.10)
GFR calc non Af Amer: 72 mL/min — ABNORMAL LOW (ref >90)
Glucose, Bld: 162 mg/dL — ABNORMAL HIGH (ref 70–99)
Potassium: 4 mEq/L (ref 3.5–5.1)

## 2013-05-12 LAB — CBC
Hemoglobin: 11.9 g/dL — ABNORMAL LOW (ref 12.0–15.0)
MCH: 30.9 pg (ref 26.0–34.0)
MCHC: 34.1 g/dL (ref 30.0–36.0)
MCV: 90.6 fL (ref 78.0–100.0)
Platelets: 167 10*3/uL (ref 150–400)

## 2013-05-12 MED ORDER — VENLAFAXINE HCL ER 150 MG PO CP24
225.0000 mg | ORAL_CAPSULE | Freq: Every day | ORAL | Status: DC
  Administered 2013-05-12: 14:00:00 225 mg via ORAL
  Filled 2013-05-12: qty 1

## 2013-05-12 MED ORDER — METFORMIN HCL ER 500 MG PO TB24: 1000.0000 mg | ORAL_TABLET | Freq: Every evening | ORAL | Status: DC

## 2013-05-12 MED ORDER — ATORVASTATIN CALCIUM 20 MG PO TABS: 20.0000 mg | ORAL_TABLET | Freq: Every day | ORAL | Status: AC

## 2013-05-12 MED ORDER — ASPIRIN 81 MG PO TBEC: 81.0000 mg | DELAYED_RELEASE_TABLET | Freq: Every day | ORAL | Status: DC

## 2013-05-12 MED ORDER — NITROGLYCERIN 0.4 MG SL SUBL: 0.4000 mg | SUBLINGUAL_TABLET | SUBLINGUAL | Status: AC | PRN

## 2013-05-12 NOTE — Progress Notes (Signed)
Patient ID: KEARI MIU, female   DOB: 1955-09-27, 57 y.o.   MRN: [id number]  Coronary CT angiogram showed < 50% stenosis in the proximal LAD and echocardiogram was unremarkable.  No further cardiac workup.  Patient needs to be on ASA 81 and statin.   She can have followup with cardiology after discharge.  Call with further questions.   Marca Ancona 05/12/2013

## 2013-05-12 NOTE — Progress Notes (Signed)
Patient d/c'd to home w/husband via w/c.  Discharge instructions were given with all questions and concerns addressed and answered.  Prescriptions were given by Allison,N.P.  PIV d/c'd and pressure dressing applied to site.  Patient awake alert and oriented at time of discharge.  Contacts, meds and clothing taken with patient at time of discharge.

## 2013-05-12 NOTE — Discharge Summary (Signed)
Physician Discharge Summary  [name]:[id number] DOB: 01/19/1956 DOA: 05/10/2013  PCP: No primary provider on file.  Admit date: 05/10/2013 Discharge date: 05/12/2013  Time spent: 30 minutes  Recommendations for Outpatient Follow-up:  1. Follow up with Dr. Andrey Campanile on Oct. 31 to have BP rechecked and to determine if it is appropriate to resume your BP medications.  Discharge Diagnoses:  Active Problems:   Chest pain due to non obstructive CAD   Near syncope due to orthostatic Hypotension   Type 2 diabetes mellitus with diabetic neuropathy   Dehydration due to home diuretics   CKD (chronic kidney disease) stage 2, GFR 60-89 ml/min   Hypertension   Bradycardia, drug induced-resolved   Discharge Condition: stable  Diet recommendation: Carbohydrate modified  Filed Weights   05/10/13 2100 05/10/13 2332  Weight: 97.523 kg (215 lb) 108.138 kg (238 lb 6.4 oz)    History of present illness:  57 year old female patient who developed exertional chest pain associated with diaphoresis and nausea. Apparently also had near syncopal episode at same time. Pain also radiated to the right arm. Pain resolved after nitroglycerin. Pain persisted even at rest. Patient's risk factors are diabetes and hypertension. The patient presented to the emergency department where initial EKG was negative. Initial cardiac enzymes negative. Patient endorsed constant chest pain total duration of ~4 hours. In addition to her personal risk factors this patient has extensive family history of early onset CAD.   Hospital Course:  Chest pain at rest  -Coronary CT Angio with <50% nonobstructive CAD so Cards says treat medically  -enzymes remained negative  -developed bradycardia and hypotension after beta blockers so these were dc'd  -cont statin and ASA after dc -Suspect sx' more r/t orthostasis (see below)  -2-D echocardiogram with normal LV function and without regional wall motion abnormalities  Near  syncope/Hypotension  -orthostatic vital signs were obtained due to persistent hypotension after admission. SBP dropped from 114 to 63 and pt was symptomatic with dizziness and near syncopal symptoms similar to pre admission  -BP rebounded to normal levels after multiple fluid boluses and IVFs overnight -hold all anti HTN meds including diuretics  -has chronic LLE edema from prior ankle injury so concerns initial near syncopal sx's were possibly due to PE but d dimer was negative   Type 2 diabetes mellitus with diabetic neuropathy  -CBGs well controlled with SSI only during admission -will hold Metformin until 10/31 post IV contrast  -she has CKD so Metformin may not be appropriate long term  CKD (chronic kidney disease) stage 2, GFR 60-89 ml/min  -mild worsening and likely due to [facility] - resolved after hydration  -15/0.88 on date of dc  Hypertension  - BP better on date of dc but will defer resumption of ant- hypertensive meds to PCP-recommend she FU on Oct 31  Bradycardia, drug induced  -BB had been initiated this admission due to chest pain and concerns of ischemia but had to be dc'd due to bradycardia    Procedures: 2D ECHO - Left ventricle: The cavity size was normal. Wall thickness was normal. Systolic function was normal. The estimated ejection fraction was in the range of 60% to 65%. Wall motion was normal; there were no regional wall motion abnormalities. - Right ventricle: The cavity size was normal. Systolic function was normal.    Consultations: Cardiology/Dr. Marca Ancona  Discharge Exam: Filed Vitals:   05/12/13 1107  BP: 142/54  Pulse:   Temp: 98 F (36.7 C)  Resp: 25  General: No acute respiratory distress  Lungs: Clear to auscultation bilaterally without wheezes or crackles, RA  Cardiovascular: Regular rate and rhythm without murmur gallop or rub normal S1 and S2, no peripheral edema or JVD Abdomen: Nontender, nondistended, soft, bowel sounds  positive, no rebound, no ascites, no appreciable mass  Musculoskeletal: No significant cyanosis, clubbing of bilateral lower extremities  Neurological: Alert and oriented x 3, moves all extremities x 4 without focal neurological deficits, CN 2-12 intact   Discharge Instructions      Discharge Orders   Future Appointments Provider Department Dept Phone   06/29/2013 10:00 AM Antionette Char, MD [facility] [phone]   Future Orders Complete By Expires   Call MD for:  difficulty breathing, headache or visual disturbances  As directed    Call MD for:  extreme fatigue  As directed    Call MD for:  hives  As directed    Call MD for:  persistant dizziness or light-headedness  As directed    Call MD for:  temperature >100.4  As directed    Diet Carb Modified  As directed    Increase activity slowly  As directed        Medication List    STOP taking these medications       lisinopril-hydrochlorothiazide 10-12.5 MG per tablet  Commonly known as:  PRINZIDE,ZESTORETIC      TAKE these medications       acetaminophen 650 MG CR tablet  Commonly known as:  TYLENOL  Take 1,300 mg by mouth every 8 (eight) hours as needed for pain.     aspirin 81 MG EC tablet  Take 1 tablet (81 mg total) by mouth daily.     atorvastatin 20 MG tablet  Commonly known as:  LIPITOR  Take 1 tablet (20 mg total) by mouth daily.     metFORMIN 500 MG 24 hr tablet  Commonly known as:  GLUCOPHAGE-XR  Take 2 tablets (1,000 mg total) by mouth every evening.  Start taking on:  05/14/2013     nitroGLYCERIN 0.4 MG SL tablet  Commonly known as:  NITROSTAT  Place 1 tablet (0.4 mg total) under the tongue every 5 (five) minutes as needed for chest pain.     ONGLYZA 5 MG Tabs tablet  Generic drug:  saxagliptin HCl  Take 5 mg by mouth daily.     Venlafaxine HCl 225 MG Tb24  Take 225 mg by mouth daily.       Allergies  Allergen Reactions  . Relafen [Nabumetone]     Trouble breathing, break out   . Restasis [Cyclosporine]     Eyes burn and itch   Follow-up Information   Follow up On 05/15/2013. (Make appt to see Dr. Andrey Campanile so decision about resuming blood pressure medications can be determined)        The results of significant diagnostics from this hospitalization (including imaging, microbiology, ancillary and laboratory) are listed below for reference.    Significant Diagnostic Studies: Ct Coronary Morp W/cta Cor W/score W/ca W/cm &/or Wo/cm  05/11/2013   ADDENDUM REPORT: 05/11/2013 12:28  CLINICAL DATA:  Chest pain, diabetes, hypertension  EXAM: Cardiac CTA  MEDICATIONS: Sub lingual nitro 0.4mg .  TECHNIQUE: The patient was scanned on a Philips 256 slice scanner. Gantry rotation speed was 270 msecs. Collimation was .9mm. A 120 kV prospective scan was triggered in the descending thoracic aorta at 111 HUs with 5% padding centered around 78% of the R-R interval. Average HR during the scan  was 56 bpm. The 3D data set was interpreted on a dedicated work station using MPR, MIP and VRT modes. A total of 80cc of contrast was used.  FINDINGS: Non-cardiac: See separate report from [facility].  Calcium Score:  175 Agatston units, all in the LAD  Coronary Arteries: Right dominant with no anomalies  LM:  No plaque or stenosis.  LAD: There was mixed plaque in the ostial and proximal LAD. There appeared to be no more than 50% stenosis. The remainder of the LAD had no significant plaque.  D1:  Small, no significant disease.  D2:  Small, no significant disease.  Circumflex: There was a small ramus with no plaque or stenosis. The AV LCx showed no plaque or stenosis.  OM1:  Small, no significant disease.  OM2:  Moderate, no significant disease.  RCA:  Dominant vessel, no plaque or stenosis in the RCA system.  IMPRESSION: 1. There is prominent plaque in the ostial and proximal LAD but no more than 50% stenosis.  2. Coronary artery calcium score of 175 Agatston units, placing the patient in the 94th  percentile for her age and gender. This suggests high risk of future cardiac events.  3. Would medically manage the patient at this time given no elevation in cardiac enzymes and only nonobstructive CAD. She should be on a statin and an aspirin.  Dalton Mclean   Electronically Signed   By: Marca Ancona M.D.   On: 05/11/2013 12:28   05/11/2013   EXAM: OVER-READ INTERPRETATION  CT CHEST  The following report is an over-read performed by radiologist Dr. Noe Gens [facility] on 05/11/2013. This over-read does not include interpretation of cardiac or coronary anatomy or pathology. The coronary CTA interpretation by the cardiologist is attached.  COMPARISON:  None.  FINDINGS: No adenopathy in the visualized lower mediastinum or hila. Visualized aorta is normal caliber. Visualized lung fields are clear. No pleural effusions. Imaging into the upper abdomen shows no acute findings. No acute bony abnormality.  IMPRESSION: No acute or significant extracardiac abnormality.  Electronically Signed: By: Charlett Nose M.D. On: 05/11/2013 10:53   Dg Chest Port 1 View  05/10/2013   CLINICAL DATA:  Left chest pressure.  EXAM: PORTABLE CHEST - 1 VIEW  COMPARISON:  08/10/2007  FINDINGS: The heart size and mediastinal contours are within normal limits. Both lungs are clear. The visualized skeletal structures are unremarkable.  IMPRESSION: No active disease.   Electronically Signed   By: Oley Balm M.D.   On: 05/10/2013 20:23    Microbiology: Recent Results (from the past 240 hour(s))  MRSA PCR SCREENING     Status: None   Collection Time    05/10/13 11:22 PM      Result Value Range Status   MRSA by PCR NEGATIVE  NEGATIVE Final   Comment:            The GeneXpert MRSA Assay (FDA     approved for NASAL specimens     only), is one component of a     comprehensive MRSA colonization     surveillance program. It is not     intended to diagnose MRSA     infection nor to guide or     monitor  treatment for     MRSA infections.     Labs: Basic Metabolic Panel:  Recent Labs Lab 05/10/13 1940 05/10/13 1946 05/11/13 0409 05/12/13 0415  NA 140 142 141 140  K 3.0* 3.0* 4.1 4.0  CL 103 103 107 106  CO2 27  --  25 24  GLUCOSE 114* 111* 125* 162*  BUN 14 14 13 15   CREATININE 0.97 1.30* 0.94 0.88  CALCIUM 8.8  --  8.6 8.3*   Liver Function Tests:  Recent Labs Lab 05/10/13 1940  AST 16  ALT 17  ALKPHOS 84  BILITOT 0.1*  PROT 6.4  ALBUMIN 3.4*   No results found for this basename: LIPASE, AMYLASE,  in the last 168 hours No results found for this basename: AMMONIA,  in the last 168 hours CBC:  Recent Labs Lab 05/10/13 1940 05/10/13 1946 05/11/13 0409 05/12/13 0415  WBC 7.8  --  7.4 5.7  NEUTROABS 4.7  --   --   --   HGB 14.2 14.3 13.0 11.9*  HCT 40.8 42.0 37.8 34.9*  MCV 87.7  --  88.7 90.6  PLT 226  --  208 167   Cardiac Enzymes:  Recent Labs Lab 05/11/13 0100 05/11/13 0409 05/11/13 1137  TROPONINI <0.30 <0.30 <0.30   BNP: BNP (last 3 results) No results found for this basename: PROBNP,  in the last 8760 hours CBG:  Recent Labs Lab 05/11/13 1346 05/11/13 1654 05/11/13 2217 05/12/13 0754 05/12/13 1204  GLUCAP 138* 199* 133* 150* 130*       Signed:  ELLIS,ALLISON L. ANP [facility] 05/12/2013, 12:49 PM   I have examined the patient, reviewed the chart and modified the above note which I agree with.   Lerin Jech,MD [phone] 05/12/2013, 9:11 AM

## 2013-05-25 ENCOUNTER — Encounter: Payer: Self-pay | Admitting: Obstetrics & Gynecology

## 2013-06-29 ENCOUNTER — Encounter: Payer: Self-pay | Admitting: Obstetrics & Gynecology

## 2013-06-29 ENCOUNTER — Ambulatory Visit (INDEPENDENT_AMBULATORY_CARE_PROVIDER_SITE_OTHER): Payer: Managed Care, Other (non HMO) | Admitting: Obstetrics & Gynecology

## 2013-06-29 VITALS — BP 120/83 | HR 71 | Temp 98.0°F | Ht 66.5 in | Wt 234.0 lb

## 2013-06-29 DIAGNOSIS — Z0000 Encounter for general adult medical examination without abnormal findings: Secondary | ICD-10-CM

## 2013-06-29 DIAGNOSIS — Z01419 Encounter for gynecological examination (general) (routine) without abnormal findings: Secondary | ICD-10-CM

## 2013-06-29 NOTE — Progress Notes (Signed)
Subjective:     [name] is a 57 y.o. female here for a routine exam.  C Personal health questionnaire reviewed: yes.   Gynecologic History No LMP recorded. Patient is postmenopausal. Contraception: post menopausal status Last Pap: 1 yr ago--wnl Last mammogram: 2013. Results were: normal  Obstetric History OB History  No data available     The following portions of the patient's history were reviewed and updated as appropriate: allergies, current medications, past family history, past medical history, past social history, past surgical history and problem list.  Review of Systems Pertinent items are noted in HPI.    Objective:    General appearance: alert Breasts: normal appearance, no masses or tenderness Abdomen: soft, non-tender; bowel sounds normal; no masses,  no organomegaly Pelvic: cervix normal in appearance, external genitalia normal, no adnexal masses or tenderness, uterus normal size, shape, and consistency and vagina normal without discharge    Assessment:    Healthy female exam.    Plan:   Return prn

## 2013-06-29 NOTE — Patient Instructions (Addendum)
Please remember to schedule mammogram for January 2015 Follow up for routine annual exam in 2015  Health Maintenance, Female A healthy lifestyle and preventative care can promote health and wellness.  Maintain regular health, dental, and eye exams.  Eat a healthy diet. Foods like vegetables, fruits, whole grains, low-fat dairy products, and lean protein foods contain the nutrients you need without too many calories. Decrease your intake of foods high in solid fats, added sugars, and salt. Get information about a proper diet from your caregiver, if necessary.  Regular physical exercise is one of the most important things you can do for your health. Most adults should get at least 150 minutes of moderate-intensity exercise (any activity that increases your heart rate and causes you to sweat) each week. In addition, most adults need muscle-strengthening exercises on 2 or more days a week.   Maintain a healthy weight. The body mass index (BMI) is a screening tool to identify possible weight problems. It provides an estimate of body fat based on height and weight. Your caregiver can help determine your BMI, and can help you achieve or maintain a healthy weight. For adults 20 years and older:  A BMI below 18.5 is considered underweight.  A BMI of 18.5 to 24.9 is normal.  A BMI of 25 to 29.9 is considered overweight.  A BMI of 30 and above is considered obese.  Maintain normal blood lipids and cholesterol by exercising and minimizing your intake of saturated fat. Eat a balanced diet with plenty of fruits and vegetables. Blood tests for lipids and cholesterol should begin at age 35 and be repeated every 5 years. If your lipid or cholesterol levels are high, you are over 50, or you are a high risk for heart disease, you may need your cholesterol levels checked more frequently.Ongoing high lipid and cholesterol levels should be treated with medicines if diet and exercise are not effective.  If you  smoke, find out from your caregiver how to quit. If you do not use tobacco, do not start.  Lung cancer screening is recommended for adults aged 28 80 years who are at high risk for developing lung cancer because of a history of smoking. Yearly low-dose computed tomography (CT) is recommended for people who have at least a 30-pack-year history of smoking and are a current smoker or have quit within the past 15 years. A pack year of smoking is smoking an average of 1 pack of cigarettes a day for 1 year (for example: 1 pack a day for 30 years or 2 packs a day for 15 years). Yearly screening should continue until the smoker has stopped smoking for at least 15 years. Yearly screening should also be stopped for people who develop a health problem that would prevent them from having lung cancer treatment.  If you are pregnant, do not drink alcohol. If you are breastfeeding, be very cautious about drinking alcohol. If you are not pregnant and choose to drink alcohol, do not exceed 1 drink per day. One drink is considered to be 12 ounces (355 mL) of beer, 5 ounces (148 mL) of wine, or 1.5 ounces (44 mL) of liquor.  Avoid use of street drugs. Do not share needles with anyone. Ask for help if you need support or instructions about stopping the use of drugs.  High blood pressure causes heart disease and increases the risk of stroke. Blood pressure should be checked at least every 1 to 2 years. Ongoing high blood pressure should be  treated with medicines, if weight loss and exercise are not effective.  If you are 20 to 56 years old, ask your caregiver if you should take aspirin to prevent strokes.  Diabetes screening involves taking a blood sample to check your fasting blood sugar level. This should be done once every 3 years, after age 80, if you are within normal weight and without risk factors for diabetes. Testing should be considered at a younger age or be carried out more frequently if you are overweight and  have at least 1 risk factor for diabetes.  Breast cancer screening is essential preventative care for women. You should practice "breast self-awareness." This means understanding the normal appearance and feel of your breasts and may include breast self-examination. Any changes detected, no matter how small, should be reported to a caregiver. Women in their 40s and 30s should have a clinical breast exam (CBE) by a caregiver as part of a regular health exam every 1 to 3 years. After age 71, women should have a CBE every year. Starting at age 31, women should consider having a mammogram (breast X-ray) every year. Women who have a family history of breast cancer should talk to their caregiver about genetic screening. Women at a high risk of breast cancer should talk to their caregiver about having an MRI and a mammogram every year.  Breast cancer gene (BRCA)-related cancer risk assessment is recommended for women who have family members with BRCA-related cancers. BRCA-related cancers include breast, ovarian, tubal, and peritoneal cancers. Having family members with these cancers may be associated with an increased risk for harmful changes (mutations) in the breast cancer genes BRCA1 and BRCA2. Results of the assessment will determine the need for genetic counseling and BRCA1 and BRCA2 testing.  The Pap test is a screening test for cervical cancer. Women should have a Pap test starting at age 81. Between ages 87 and 59, Pap tests should be repeated every 2 years. Beginning at age 38, you should have a Pap test every 3 years as long as the past 3 Pap tests have been normal. If you had a hysterectomy for a problem that was not cancer or a condition that could lead to cancer, then you no longer need Pap tests. If you are between ages 62 and 87, and you have had normal Pap tests going back 10 years, you no longer need Pap tests. If you have had past treatment for cervical cancer or a condition that could lead to  cancer, you need Pap tests and screening for cancer for at least 20 years after your treatment. If Pap tests have been discontinued, risk factors (such as a new sexual partner) need to be reassessed to determine if screening should be resumed. Some women have medical problems that increase the chance of getting cervical cancer. In these cases, your caregiver may recommend more frequent screening and Pap tests.  The human papillomavirus (HPV) test is an additional test that may be used for cervical cancer screening. The HPV test looks for the virus that can cause the cell changes on the cervix. The cells collected during the Pap test can be tested for HPV. The HPV test could be used to screen women aged 5 years and older, and should be used in women of any age who have unclear Pap test results. After the age of 49, women should have HPV testing at the same frequency as a Pap test.  Colorectal cancer can be detected and often prevented. Most routine  colorectal cancer screening begins at the age of 53 and continues through age 52. However, your caregiver may recommend screening at an earlier age if you have risk factors for colon cancer. On a yearly basis, your caregiver may provide home test kits to check for hidden blood in the stool. Use of a small camera at the end of a tube, to directly examine the colon (sigmoidoscopy or colonoscopy), can detect the earliest forms of colorectal cancer. Talk to your caregiver about this at age 25, when routine screening begins. Direct examination of the colon should be repeated every 5 to 10 years through age 3, unless early forms of pre-cancerous polyps or small growths are found.  Hepatitis C blood testing is recommended for all people born from [birth year] through 1964 and any individual with known risks for hepatitis C.  Practice safe sex. Use condoms and avoid high-risk sexual practices to reduce the spread of sexually transmitted infections (STIs). Sexually active women  aged 60 and younger should be checked for Chlamydia, which is a common sexually transmitted infection. Older women with new or multiple partners should also be tested for Chlamydia. Testing for other STIs is recommended if you are sexually active and at increased risk.  Osteoporosis is a disease in which the bones lose minerals and strength with aging. This can result in serious bone fractures. The risk of osteoporosis can be identified using a bone density scan. Women ages 59 and over and women at risk for fractures or osteoporosis should discuss screening with their caregivers. Ask your caregiver whether you should be taking a calcium supplement or vitamin D to reduce the rate of osteoporosis.  Menopause can be associated with physical symptoms and risks. Hormone replacement therapy is available to decrease symptoms and risks. You should talk to your caregiver about whether hormone replacement therapy is right for you.  Use sunscreen. Apply sunscreen liberally and repeatedly throughout the day. You should seek shade when your shadow is shorter than you. Protect yourself by wearing long sleeves, pants, a wide-brimmed hat, and sunglasses year round, whenever you are outdoors.  Notify your caregiver of new moles or changes in moles, especially if there is a change in shape or color. Also notify your caregiver if a mole is larger than the size of a pencil eraser.  Stay current with your immunizations. Document Released: 01/15/2011 Document Revised: 10/27/2012 Document Reviewed: 01/15/2011 [facility] Patient Information 2014 [place].

## 2013-07-01 LAB — PAP IG AND CT-NG NAA

## 2013-07-22 ENCOUNTER — Other Ambulatory Visit: Payer: Self-pay | Admitting: Family Medicine

## 2013-07-22 DIAGNOSIS — Z1231 Encounter for screening mammogram for malignant neoplasm of breast: Secondary | ICD-10-CM

## 2013-08-05 ENCOUNTER — Encounter: Payer: Self-pay | Admitting: Obstetrics & Gynecology

## 2013-08-20 ENCOUNTER — Ambulatory Visit
Admission: RE | Admit: 2013-08-20 | Discharge: 2013-08-20 | Disposition: A | Discharge status: Home / Self Care | Payer: 59 | Source: Ambulatory Visit | Attending: Family Medicine | Admitting: Family Medicine

## 2013-08-20 DIAGNOSIS — Z1231 Encounter for screening mammogram for malignant neoplasm of breast: Secondary | ICD-10-CM

## 2013-08-25 ENCOUNTER — Other Ambulatory Visit: Payer: Self-pay | Admitting: Family Medicine

## 2013-08-25 DIAGNOSIS — R928 Other abnormal and inconclusive findings on diagnostic imaging of breast: Secondary | ICD-10-CM

## 2013-09-08 ENCOUNTER — Other Ambulatory Visit: Payer: 59

## 2013-09-18 ENCOUNTER — Ambulatory Visit
Admission: RE | Admit: 2013-09-18 | Discharge: 2013-09-18 | Disposition: A | Discharge status: Home / Self Care | Payer: 59 | Source: Ambulatory Visit | Attending: Family Medicine | Admitting: Family Medicine

## 2013-09-18 DIAGNOSIS — R928 Other abnormal and inconclusive findings on diagnostic imaging of breast: Secondary | ICD-10-CM

## 2014-05-03 ENCOUNTER — Other Ambulatory Visit: Payer: Self-pay | Admitting: *Deleted

## 2014-05-03 ENCOUNTER — Encounter: Payer: Self-pay | Admitting: Endocrinology

## 2014-05-03 ENCOUNTER — Ambulatory Visit (INDEPENDENT_AMBULATORY_CARE_PROVIDER_SITE_OTHER): Payer: 59 | Admitting: Endocrinology

## 2014-05-03 VITALS — BP 118/82 | HR 78 | Temp 97.9°F | Resp 16 | Ht 66.5 in | Wt 230.0 lb

## 2014-05-03 DIAGNOSIS — I10 Essential (primary) hypertension: Secondary | ICD-10-CM

## 2014-05-03 DIAGNOSIS — Z23 Encounter for immunization: Secondary | ICD-10-CM

## 2014-05-03 DIAGNOSIS — E1165 Type 2 diabetes mellitus with hyperglycemia: Secondary | ICD-10-CM

## 2014-05-03 DIAGNOSIS — E1142 Type 2 diabetes mellitus with diabetic polyneuropathy: Secondary | ICD-10-CM

## 2014-05-03 DIAGNOSIS — IMO0002 Reserved for concepts with insufficient information to code with codable children: Secondary | ICD-10-CM

## 2014-05-03 MED ORDER — ONETOUCH DELICA LANCETS FINE MISC: Status: DC

## 2014-05-03 MED ORDER — GLUCOSE BLOOD VI STRP: ORAL_STRIP | Status: DC

## 2014-05-03 MED ORDER — GABAPENTIN 600 MG PO TABS: ORAL_TABLET | ORAL | Status: DC

## 2014-05-03 MED ORDER — DULAGLUTIDE 0.75 MG/0.5ML ~~LOC~~ SOAJ: SUBCUTANEOUS | Status: DC

## 2014-05-03 NOTE — Progress Notes (Signed)
Patient ID: [name], female   DOB: [birth year], 57 y.o.   MRN: [id number]           Reason for Appointment: Consultation for Type 2 Diabetes  Referring physician: Kathryne Eriksson  History of Present Illness:          Diagnosis: Type 2 diabetes mellitus, date of diagnosis:  2010       Past history:  She was having symptoms of neuropathy at onset At that time she was started on metformin alone, 500 mg twice a day and this was continued unchanged until recently Apparently she was also given Onglyza about 4 years ago but details of her control at that time is not available Her A1c levels have been mild increase over the last year but she thinks her blood sugars had been generally good with only some readings up to 200. A1c was 7.1 in 1/15 and she was continued on metformin and Onglyza  Recent history:  She thinks her blood sugars have been getting higher for the last 6 months or so She has had blood sugars of 200 or more mostly especially recently When her A1c was 7.9 in August she was told to double her metformin to 2 g daily However she does not think her blood sugars are much better Now she is complaining of feeling tired all the time as well as increasing thirst, urination including nocturia 4 times and some weight loss No complaints of blurred vision or candidiasis She does have recurrent skin abscesses on her lateral trunk area which are treated by PCP Hypoglycemia: None        Oral hypoglycemic drugs the patient is taking are: Onglyza 5 mg daily, metformin ER 2 g daily      Side effects from medications have been: None Compliance with the medical regimen: Good  Glucose monitoring:  done once or twice a day         Glucometer:  [employer].      Blood Glucose readings from home monitor: Mostly between 174-274, slightly higher in the evenings after supper   Self-care: The diet that the patient has been following is: tries to limit fats.     Meals: 3 meals per day. Breakfast  is eggs and bacon, lunch is a sandwich, and dinner will have baked chicken, starch and vegetables. Has snacks with yogurt, cottage cheese, fruit, nuts and  rice cakes           Exercise: walks   up to 20 minutes, 3 days a week        Dietician visit, most recent: 7/15, is seeing the dietitian in [place]               Weight history: Wt Readings from Last 3 Encounters:  05/03/14 230 lb (104.327 kg)  06/29/13 234 lb (106.142 kg)  05/10/13 238 lb 6.4 oz (108.138 kg)    Glycemic control: 7.9 in 8/15   Lab Results  Component Value Date   HGBA1C 6.8* 05/11/2013   Lab Results  Component Value Date   LDLCALC 105* 05/11/2013   CREATININE 0.88 05/12/2013         Medication List       This list is accurate as of: 05/03/14  1:50 PM.  Always use your most recent med list.               acetaminophen 650 MG CR tablet  Commonly known as:  TYLENOL  Take 1,300 mg by mouth  every 8 (eight) hours as needed for pain.     aspirin 81 MG EC tablet  Take 1 tablet (81 mg total) by mouth daily.     atorvastatin 20 MG tablet  Commonly known as:  LIPITOR  Take 1 tablet (20 mg total) by mouth daily.     CLEVER CHEK AUTO-CODE VOICE test strip  Generic drug:  glucose blood     Diclofenac Sodium 3 % Gel     lisinopril 20 MG tablet  Commonly known as:  PRINIVIL,ZESTRIL  Take 20 mg by mouth daily.     metFORMIN 500 MG 24 hr tablet  Commonly known as:  GLUCOPHAGE-XR  Take 2 tablets (1,000 mg total) by mouth every evening.     nitroGLYCERIN 0.4 MG SL tablet  Commonly known as:  NITROSTAT  Place 1 tablet (0.4 mg total) under the tongue every 5 (five) minutes as needed for chest pain.     ONGLYZA 5 MG Tabs tablet  Generic drug:  saxagliptin HCl  Take 5 mg by mouth daily.     spironolactone-hydrochlorothiazide 25-25 MG per tablet  Commonly known as:  ALDACTAZIDE     Venlafaxine HCl 225 MG Tb24  Take 225 mg by mouth daily.        Allergies:  Allergies  Allergen Reactions  . Other       seasonal  . Relafen [Nabumetone]     Trouble breathing, break out  . Restasis [Cyclosporine]     Eyes burn and itch    Past Medical History  Diagnosis Date  . Diabetes mellitus without complication   . Hypertension   . Obesity (BMI 30-39.9)   . Depression 05/10/2013  . Osteopenia   . 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 Diverticulosis   . Irritable bowel   . Neuropathy associated with endocrine disorder   . Neuropathy   . Colon polyps     Past Surgical History  Procedure Laterality Date  . Arm surgery Right   . Plantar faci      Family History  Problem Relation Age of Onset  . Diabetes type II Mother   . Stroke Mother   . Hypertension Mother   . Diabetes Mother   . Heart disease Father   . Hypertension Father   . Diabetes Father   . Heart disease Brother   . Hypertension Brother   . Diabetes Brother   . Kidney disease Brother   . Leukemia Other     Social History:  reports that she has never smoked. She has never used smokeless tobacco. She reports that she does not drink alcohol or use illicit drugs.    Review of Systems       Vision is normal. Most recent eye exam was 11/14       Lipids: These have been controlled with Lipitor 20 mg daily, last LDL in 1/15 was 72       Lab Results  Component Value Date   CHOL 175 05/11/2013   HDL 54 05/11/2013   LDLCALC 105* 05/11/2013   TRIG 79 05/11/2013   CHOLHDL 3.2 05/11/2013                  Skin: No rash; has had abscees trunk area treated with antibiotics and incision and drainage     Thyroid: She has had normal thyroid levels and no history of thyroid disease     The blood pressure has been high for several years, she  thinks blood pressure was relatively low recently and felt dizzy but now is feeling better with no low readings      Tends to have swelling of feet. She is on diuretics     No shortness of breath or chest tightness  on exertion.     Bowel habits: Normal.       No joint  pains.           She has a long history of Numbness, pain, tingling or burning in feet  Has significant difficulty with symptoms at night. She thinks gabapentin previously had not helped her symptoms or Lyrica caused depression. Taking tramadol as needed with some relief and also local diclofenac gel     Depression present, on Effexor   LABS:  Last serum creatinine 0.9 and glucose 208 in 8/15   Physical Examination:  BP 124/80  Pulse 78  Temp(Src) 97.9 F (36.6 C)  Resp 16  Ht 5' 6.5" (1.689 m)  Wt 230 lb (104.327 kg)  BMI 36.57 kg/m2  SpO2 94%  Standing blood pressure 118/82 with large cuff GENERAL:         Patient has generalized obesity.   HEENT:         Eye exam shows normal external appearance. Fundus exam shows no retinopathy. Oral exam shows normal mucosa .  NECK:         General:  Neck exam shows no lymphadenopathy. Carotids are normal to palpation and no bruit heard.  Thyroid is not enlarged and no nodules felt.   LUNGS:         Chest is symmetrical. Lungs are clear to auscultation.Marland Kitchen   HEART:         Heart sounds:  S1 and S2 are normal. No murmurs or clicks heard., no S3 or S4.   ABDOMEN:   There is no distention present. Liver and spleen are not palpable. No other mass or tenderness present.  EXTREMITIES:     There is no edema. No skin lesions present.Marland Kitchen  NEUROLOGICAL:   Vibration sense is markedly reduced in toes. Ankle jerks are 1+ bilaterally.          Diabetic foot exam:  Absent in the toes on the right, decreased on the left great toe and absent on the smaller left toes And plantar surfaces Pedal pulses not palpable on the right MUSCULOSKELETAL:       There is no enlargement or deformity of the joints. Spine is normal to inspection.Marland Kitchen   SKIN:       No rash or lesions of concern.        ASSESSMENT:  Diabetes type 2, uncontrolled with significant obesity Currently is somewhat symptomatic from her hyperglycemia also She is on a 2 drug regimen of Onglyza  and metformin and likely has some insulin deficiency However she does need significant amount of weight loss and is a good candidate for a GLP-1 drug before starting insulin  Discussed with the patient the nature of GLP-1 drugs, the action on various organ systems, how they benefit blood glucose control, as well as the benefit of weight loss and  increase satiety . Explained possible side effects especially nausea and vomiting; discussed safety information in package insert.   Hypertension: Appears well controlled  History of hyperlipidemia: Controlled with Lipitor  Complications: Peripheral neuropathy which is painful with some sensory loss.  Will need assessment of urine microalbumin when blood sugars are better controlled  PLAN:   To start Trulicity injections weekly  Demonstrated the Trulicity injection device and injection technique to the patient. Discussed injection sites and titration of Trulicity starting with 0.75 mg once a week for 3 weeks and then increasing to 1.5 mg if no symptoms of nausea. Patient brochure on Trulicity and co-pay card given  She can leave off her Onglyza when her supply finishes  Consider adding Amaryl or glipizide if blood sugars are still consistently high on the next visit; most likely can also increase the dose of Trulicity to 1.5 mg if tolerated  Encouraged her to exercise more regularly and hopefully she can lose more weight  She was trained on the use of a One Touch ultra meter which would be more accurate and easy to download than her a generic monitor  Followup in 3 weeks  Trial of gabapentin 600 mg at bedtime and may increase this to 3 times a day if getting relief  Counseling time over 50% of today's 60 minute visit  [place] 05/03/2014, 1:50 PM   Note: This office note was prepared with Estate agent. Any transcriptional errors that result from this process are unintentional.

## 2014-05-03 NOTE — Patient Instructions (Addendum)
Please check blood sugars at least half the time about 2 hours after any meal and 3-4 times per week on waking up.  Please bring blood sugar monitor to each visit  TRULICITY: Start injections once a week, may cause nausea initially. This will help to reduce portions at mealtimes and snacks Stop Onglyza when finished  Continue metformin unchanged Try to walk at least 5-10 minutes daily  May try gabapentin first at bedtime and increase it to 3 times a day if needed

## 2014-05-24 ENCOUNTER — Ambulatory Visit: Payer: 59 | Admitting: Endocrinology

## 2014-06-16 ENCOUNTER — Ambulatory Visit (INDEPENDENT_AMBULATORY_CARE_PROVIDER_SITE_OTHER): Payer: 59 | Admitting: Endocrinology

## 2014-06-16 ENCOUNTER — Other Ambulatory Visit: Payer: Self-pay | Admitting: *Deleted

## 2014-06-16 ENCOUNTER — Ambulatory Visit: Payer: 59 | Admitting: Endocrinology

## 2014-06-16 ENCOUNTER — Encounter: Payer: Self-pay | Admitting: Endocrinology

## 2014-06-16 VITALS — BP 117/83 | HR 95 | Temp 97.9°F | Resp 14 | Ht 66.5 in | Wt 228.0 lb

## 2014-06-16 DIAGNOSIS — I10 Essential (primary) hypertension: Secondary | ICD-10-CM

## 2014-06-16 DIAGNOSIS — E1165 Type 2 diabetes mellitus with hyperglycemia: Secondary | ICD-10-CM

## 2014-06-16 DIAGNOSIS — E1142 Type 2 diabetes mellitus with diabetic polyneuropathy: Secondary | ICD-10-CM

## 2014-06-16 DIAGNOSIS — IMO0002 Reserved for concepts with insufficient information to code with codable children: Secondary | ICD-10-CM

## 2014-06-16 MED ORDER — LIRAGLUTIDE 18 MG/3ML ~~LOC~~ SOPN: PEN_INJECTOR | SUBCUTANEOUS | Status: DC

## 2014-06-16 MED ORDER — GLIMEPIRIDE 1 MG PO TABS: 1.0000 mg | ORAL_TABLET | Freq: Every day | ORAL | Status: DC

## 2014-06-16 NOTE — Progress Notes (Signed)
Patient ID: [name], female   DOB: [birth year], 58 y.o.   MRN: [id number]           Reason for Appointment: Follow-up for Type 2 Diabetes  Referring physician: Kathryne Eriksson  History of Present Illness:          Diagnosis: Type 2 diabetes mellitus, date of diagnosis:  2010       Past history:  She was having symptoms of neuropathy at onset At that time she was started on metformin alone, 500 mg twice a day and this was continued unchanged until recently Apparently she was also given Onglyza about 4 years ago but details of her control at that time is not available Her A1c levels have been mild increase over the last year but she thinks her blood sugars had been generally good with only some readings up to 200. A1c was 7.1 in 1/15 and she was continued on metformin and Onglyza When her A1c was 7.9 in August she was told to double her metformin to 2 g daily   Recent history:  She has had blood sugars of 200 or more prior to her initial consultation Her Onglyza was changed to Trulicity 0.86 mg weekly in 10/15 She is now following up after about 6 weeks; has been taking her injection every Wednesday With this she feels that her satiety is improved and she is also trying to watch portions anyway However has lost only 2 pounds She does complain of diarrhea and abdominal discomfort the first day she takes her injection However she does  think her blood sugars are  Better Blood sugars are somewhat irregularly high at times, mostly in the afternoon or late evening; overall average glucose is 150 for the last month Does not have as much fatigue Hypoglycemia: None        Oral hypoglycemic drugs the patient is taking are: Onglyza 5 mg daily, metformin ER 2 g daily      Side effects from medications have been: Diarrhea, abdominal pain with Trulicity Compliance with the medical regimen: Good  Glucose monitoring:  done once or twice a day         Glucometer:  [employer].      Blood  Glucose readings from home monitor:   PRE-MEAL Breakfast Lunch Dinner Bedtime Overall  Glucose range:  158, 179   120-160   92-185    89-230   Median:      142    POST-MEAL PC Breakfast PC Lunch PC Dinner  Glucose range:   103-218   89-223   Mean/median:    149     Self-care: The diet that the patient has been following is: tries to limit fats.     Meals: 3 meals per day. Breakfast is eggs and bacon, lunch is a sandwich, and dinner at 5 pm will have baked chicken, starch and vegetables. Has snacks with yogurt, cottage cheese, fruit, nuts and  rice cakes           Exercise: walks up to 20 minutes, 3 days a week       Dietician visit, most recent: 7/15, is seeing the dietitian in [place]               Weight history: Wt Readings from Last 3 Encounters:  06/16/14 228 lb (103.42 kg)  05/03/14 230 lb (104.327 kg)  06/29/13 234 lb (106.142 kg)    Glycemic control: HBA1C 7.9 in 8/15   Lab Results  Component  Value Date   HGBA1C 6.8* 05/11/2013   Lab Results  Component Value Date   LDLCALC 105* 05/11/2013   CREATININE 0.88 05/12/2013         Medication List       This list is accurate as of: 06/16/14  1:27 PM.  Always use your most recent med list.               acetaminophen 650 MG CR tablet  Commonly known as:  TYLENOL  Take 1,300 mg by mouth every 8 (eight) hours as needed for pain.     aspirin 81 MG EC tablet  Take 1 tablet (81 mg total) by mouth daily.     atorvastatin 20 MG tablet  Commonly known as:  LIPITOR  Take 1 tablet (20 mg total) by mouth daily.     Diclofenac Sodium 3 % Gel     Dulaglutide 0.75 MG/0.5ML Sopn  Commonly known as:  TRULICITY  Inject once weekly     gabapentin 600 MG tablet  Commonly known as:  NEURONTIN  Take 1 tablet 3 times per day as needed     glucose blood test strip  Commonly known as:  ONE TOUCH ULTRA TEST  Use as instructed to check blood sugar 2 times per day dx code E11.49     lisinopril 20 MG tablet  Commonly known as:   PRINIVIL,ZESTRIL  Take 20 mg by mouth daily.     metFORMIN 500 MG 24 hr tablet  Commonly known as:  GLUCOPHAGE-XR  Take 2 tablets (1,000 mg total) by mouth every evening.     nitroGLYCERIN 0.4 MG SL tablet  Commonly known as:  NITROSTAT  Place 1 tablet (0.4 mg total) under the tongue every 5 (five) minutes as needed for chest pain.     ONETOUCH DELICA LANCETS FINE Misc  Use to check blood sugar 2 times per day dx code E11.49     ONGLYZA 5 MG Tabs tablet  Generic drug:  saxagliptin HCl  Take 5 mg by mouth daily.     spironolactone-hydrochlorothiazide 25-25 MG per tablet  Commonly known as:  ALDACTAZIDE     traMADol 50 MG tablet  Commonly known as:  ULTRAM  Take 50 mg by mouth every 6 (six) hours as needed.     Venlafaxine HCl 225 MG Tb24  Take 225 mg by mouth daily.        Allergies:  Allergies  Allergen Reactions  . Other     seasonal  . Relafen [Nabumetone]     Trouble breathing, break out  . Restasis [Cyclosporine]     Eyes burn and itch    Past Medical History  Diagnosis Date  . Diabetes mellitus without complication   . Hypertension   . Obesity (BMI 30-39.9)   . Depression 05/10/2013  . Osteopenia   . 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 Diverticulosis   . Irritable bowel   . Neuropathy associated with endocrine disorder   . Neuropathy   . Colon polyps     Past Surgical History  Procedure Laterality Date  . Arm surgery Right   . Plantar faci      Family History  Problem Relation Age of Onset  . Diabetes type II Mother   . Stroke Mother   . Hypertension Mother   . Diabetes Mother   . Heart disease Father   . Hypertension Father   . Diabetes Father   . Heart disease Brother   .  Hypertension Brother   . Diabetes Brother   . Kidney disease Brother   . Leukemia Other     Social History:  reports that she has never smoked. She has never used smokeless tobacco. She reports that she does not drink alcohol or use illicit  drugs.    Review of Systems       Vision is normal. Most recent eye exam was 11/14       Lipids: These have been controlled with Lipitor 20 mg daily, last LDL in 1/15 was 72       Lab Results  Component Value Date   CHOL 175 05/11/2013   HDL 54 05/11/2013   LDLCALC 105* 05/11/2013   TRIG 79 05/11/2013   CHOLHDL 3.2 05/11/2013                   The blood pressure has been high for several years           She has a long history of Numbness, pain, tingling or burning in feet  Previously had significant difficulty with symptoms at night but with increasing her gabapentin to 600 mg 3 times a day her symptoms are much better Taking tramadol as needed but less recently   Foot exam in 10/15 showed:  Vibration sense is markedly reduced in toes. Ankle jerks are 1+ bilaterally.          Diabetic foot exam:  Absent in the toes on the right, decreased on the left great toe and absent on the smaller left toes And plantar surfaces     Depression present, on Effexor   LABS:  Last serum creatinine 0.9 and glucose 208 in 8/15   Physical Examination:  BP 117/83 mmHg  Pulse 95  Temp(Src) 97.9 F (36.6 C)  Resp 14  Ht 5' 6.5" (1.689 m)  Wt 228 lb (103.42 kg)  BMI 36.25 kg/m2  SpO2 95%  [place]     ASSESSMENT:  Diabetes type 2, uncontrolled with obesity Currently glucose control is improving with changing Onglyza to Trulicity However she tends to get diarrhea and abdominal discomfort at least on the first day with this and occasional nausea Blood sugars are somewhat erratic at times and still not at target Since she needs better control will try her on Victoza instead of increasing her Trulicity She is tolerating metformin will doses and will continue the same Overall is doing fairly good with diet and exercise regimen but needs to continue working on exercise to help with weight loss  Hypertension: Appears well controlled  History of hyperlipidemia: Controlled with Lipitor and  will need periodic follow-up  Complications: Peripheral neuropathy which is less  painful with increasing gabapentin   PLAN:  To start Victoza: . Explained possible side effects especially nausea and vomiting; discussed safety information in package insert.  Instructed patient on the injection technique and explained to her dosage titration of Victoza  starting with 0.6 mg once a day at the same time for the first week and then increasing by 5 clicks up to a maximum of 1.2 mg if no symptoms of nausea. Patient brochure on Victoza and co-pay card given  Also will try adding Amaryl 1 mg at suppertime to help with overnight hypoglycemia  Check blood sugars at various times  Followup in 4 weeks and repeat A1c  Will need assessment of urine microalbumin when blood sugars are better controlled  Counseling time over 50% of today's 25 minute visit  Fayrene Towner 06/16/2014, 1:27 PM  Note: This office note was prepared with Dragon voice recognition system technology. Any transcriptional errors that result from this process are unintentional.  

## 2014-06-16 NOTE — Patient Instructions (Signed)
Glimeperide 1mg  at supper  Start VICTOZA injection with the pen once daily at the same time of the day.  Dial the dose to 0.6 mg for the first week.  You may possibly experience nausea in the first few days which usually gets better in a couple of days After 1 week increase the dose by 5 clicks then to 1.2mg  daily if no nausea present.   You may inject in the stomach, thigh or arm.    You will feel fullness of the stomach with starting the medication and should try to keep portions of food small.  Call us or the [place] helpline at [phone] or visit [email or web address] for any questions

## 2014-06-28 ENCOUNTER — Encounter: Payer: Self-pay | Admitting: Obstetrics & Gynecology

## 2014-06-28 ENCOUNTER — Ambulatory Visit (INDEPENDENT_AMBULATORY_CARE_PROVIDER_SITE_OTHER): Payer: Managed Care, Other (non HMO) | Admitting: Obstetrics & Gynecology

## 2014-06-28 VITALS — BP 123/87 | HR 100 | Temp 97.4°F | Ht 66.5 in | Wt 224.0 lb

## 2014-06-28 DIAGNOSIS — Z01419 Encounter for gynecological examination (general) (routine) without abnormal findings: Secondary | ICD-10-CM

## 2014-06-28 NOTE — Progress Notes (Signed)
Subjective:     [name] is a 58 y.o. female here for a routine exam.      Personal health questionnaire:  Is patient [name], have a family history of breast and/or ovarian cancer: no Is there a family history of uterine cancer diagnosed at age < 24, gastrointestinal cancer, urinary tract cancer, family member who is a Field seismologist syndrome-associated carrier: no Is the patient overweight and hypertensive, family history of diabetes, personal history of gestational diabetes or PCOS: yes Is patient over 32, have PCOS,  family history of premature CHD under age 4, diabetes, smoke, have hypertension or peripheral artery disease:  yes At any time, has a partner hit, kicked or otherwise hurt or frightened you?: no Over the past 2 weeks, have you felt down, depressed or hopeless?: no Over the past 2 weeks, have you felt little interest or pleasure in doing things?:no   Gynecologic History No LMP recorded. Patient is postmenopausal. Last Pap: 12/14. Results were: normal Last mammogram: 2014. Results were: normal  Obstetric History OB History  No data available    Past Medical History  Diagnosis Date  . Diabetes mellitus without complication   . Hypertension   . Obesity (BMI 30-39.9)   . Depression 05/10/2013  . Osteopenia   . 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 Diverticulosis   . Irritable bowel   . Neuropathy associated with endocrine disorder   . Neuropathy   . Colon polyps     Past Surgical History  Procedure Laterality Date  . Arm surgery Right   . Plantar faci      Current outpatient prescriptions: acetaminophen (TYLENOL) 650 MG CR tablet, Take 1,300 mg by mouth every 8 (eight) hours as needed for pain., Disp: , Rfl: ;  aspirin EC 81 MG EC tablet, Take 1 tablet (81 mg total) by mouth daily., Disp: , Rfl: ;  atorvastatin (LIPITOR) 20 MG tablet, Take 1 tablet (20 mg total) by mouth daily., Disp: 30 tablet, Rfl: 0;  Diclofenac Sodium 3 % GEL, ,  Disp: , Rfl:  gabapentin (NEURONTIN) 600 MG tablet, Take 1 tablet 3 times per day as needed, Disp: 90 tablet, Rfl: 2;  glimepiride (AMARYL) 1 MG tablet, Take 1 tablet (1 mg total) by mouth daily with breakfast., Disp: 30 tablet, Rfl: 2;  glucose blood (ONE TOUCH ULTRA TEST) test strip, Use as instructed to check blood sugar 2 times per day dx code E11.49, Disp: 100 each, Rfl: 3 Liraglutide (VICTOZA) 18 MG/3ML SOPN, Inject 0.6 mg daily for the first week, if no nausea or vomiting increase to 1.2 mg daily, Disp: 2 pen, Rfl: 2;  lisinopril (PRINIVIL,ZESTRIL) 20 MG tablet, Take 20 mg by mouth daily., Disp: , Rfl: ;  metFORMIN (GLUCOPHAGE-XR) 500 MG 24 hr tablet, Take 2 tablets (1,000 mg total) by mouth every evening., Disp: , Rfl:  ONETOUCH DELICA LANCETS FINE MISC, Use to check blood sugar 2 times per day dx code E11.49, Disp: 100 each, Rfl: 3;  spironolactone-hydrochlorothiazide (ALDACTAZIDE) 25-25 MG per tablet, , Disp: , Rfl: ;  traMADol (ULTRAM) 50 MG tablet, Take 50 mg by mouth every 6 (six) hours as needed., Disp: , Rfl: ;  Venlafaxine HCl 225 MG TB24, Take 225 mg by mouth daily., Disp: , Rfl:  nitroGLYCERIN (NITROSTAT) 0.4 MG SL tablet, Place 1 tablet (0.4 mg total) under the tongue every 5 (five) minutes as needed for chest pain. (Patient not taking: Reported on 06/28/2014), Disp: 30 tablet, Rfl: 12  Allergies  Allergen Reactions  . Other     seasonal  . Relafen [Nabumetone]     Trouble breathing, break out  . Restasis [Cyclosporine]     Eyes burn and itch    History  Substance Use Topics  . Smoking status: Never Smoker   . Smokeless tobacco: Never Used  . Alcohol Use: No    Family History  Problem Relation Age of Onset  . Diabetes type II Mother   . Stroke Mother   . Hypertension Mother   . Diabetes Mother   . Heart disease Father   . Hypertension Father   . Diabetes Father   . Heart disease Brother   . Hypertension Brother   . Diabetes Brother   . Kidney disease Brother   .  Leukemia Other       Review of Systems  Constitutional: negative for fatigue and weight loss Respiratory: negative for cough and wheezing Cardiovascular: negative for chest pain, fatigue and palpitations Gastrointestinal: negative for abdominal pain and change in bowel habits Musculoskeletal:negative for myalgias Neurological: negative for gait problems and tremors Behavioral/Psych: negative for abusive relationship, depression Endocrine: negative for temperature intolerance   Genitourinary:negative for abnormal menstrual periods, genital lesions, hot flashes, sexual problems and vaginal discharge Integument/breast: negative for breast lump, breast tenderness, nipple discharge and skin lesion(s)    Objective:       BP 123/87 mmHg  Pulse 100  Temp(Src) 97.4 F (36.3 C)  Ht 5' 6.5" (1.689 m)  Wt 101.606 kg (224 lb)  BMI 35.62 kg/m2 General:   alert  Skin:   no rash or abnormalities  Lungs:   clear to auscultation bilaterally  Heart:   regular rate and rhythm, S1, S2 normal, no murmur, click, rub or gallop  Breasts:   normal without suspicious masses, skin or nipple changes or axillary nodes  Abdomen:  normal findings: no organomegaly, soft, non-tender and no hernia  Pelvis:  External genitalia: normal general appearance Urinary system: urethral meatus normal and bladder without fullness, nontender Vaginal: normal without tenderness, induration or masses Cervix: normal appearance Adnexa: normal bimanual exam Uterus: anteverted and non-tender, normal size   Lab Review Urine pregnancy test Labs reviewed no Radiologic studies reviewed no    Assessment:    Healthy female exam.    Plan:    Education reviewed: calcium supplements, low fat, low cholesterol diet and weight bearing exercise.   Follow up as needed.

## 2014-06-30 NOTE — Patient Instructions (Signed)

## 2014-07-12 ENCOUNTER — Encounter: Payer: Self-pay | Admitting: *Deleted

## 2014-07-13 ENCOUNTER — Encounter: Payer: Self-pay | Admitting: Obstetrics & Gynecology

## 2014-07-14 ENCOUNTER — Ambulatory Visit (INDEPENDENT_AMBULATORY_CARE_PROVIDER_SITE_OTHER): Payer: 59 | Admitting: Endocrinology

## 2014-07-14 ENCOUNTER — Encounter: Payer: Self-pay | Admitting: Endocrinology

## 2014-07-14 VITALS — BP 141/88 | HR 70 | Temp 97.9°F | Resp 14 | Ht 66.5 in | Wt 227.2 lb

## 2014-07-14 DIAGNOSIS — E785 Hyperlipidemia, unspecified: Secondary | ICD-10-CM

## 2014-07-14 DIAGNOSIS — IMO0002 Reserved for concepts with insufficient information to code with codable children: Secondary | ICD-10-CM

## 2014-07-14 DIAGNOSIS — I10 Essential (primary) hypertension: Secondary | ICD-10-CM

## 2014-07-14 DIAGNOSIS — E1165 Type 2 diabetes mellitus with hyperglycemia: Secondary | ICD-10-CM

## 2014-07-14 LAB — MICROALBUMIN / CREATININE URINE RATIO
Creatinine,U: 154.3 mg/dL
Microalb Creat Ratio: 0.4 mg/g (ref 0.0–30.0)
Microalb, Ur: 0.6 mg/dL (ref 0.0–1.9)

## 2014-07-14 LAB — LIPID PANEL
CHOLESTEROL: 184 mg/dL (ref 0–200)
HDL: 53.7 mg/dL (ref >39.00)
LDL CALC: 100 mg/dL — AB (ref 0–99)
NonHDL: 130.3
TRIGLYCERIDES: 153 mg/dL — AB (ref 0.0–149.0)
Total CHOL/HDL Ratio: 3
VLDL: 30.6 mg/dL (ref 0.0–40.0)

## 2014-07-14 LAB — URINALYSIS, ROUTINE W REFLEX MICROSCOPIC
Bilirubin Urine: NEGATIVE
Hgb urine dipstick: NEGATIVE
KETONES UR: NEGATIVE
LEUKOCYTES UA: NEGATIVE
Nitrite: NEGATIVE
SPECIFIC GRAVITY, URINE: 1.025 (ref 1.000–1.030)
TOTAL PROTEIN, URINE-UPE24: NEGATIVE
URINE GLUCOSE: NEGATIVE
Urobilinogen, UA: 0.2 (ref 0.0–1.0)
pH: 5.5 (ref 5.0–8.0)

## 2014-07-14 LAB — BASIC METABOLIC PANEL
BUN: 16 mg/dL (ref 6–23)
CO2: 26 mEq/L (ref 19–32)
Calcium: 9.2 mg/dL (ref 8.4–10.5)
Chloride: 106 mEq/L (ref 96–112)
Creatinine, Ser: 0.8 mg/dL (ref 0.4–1.2)
GFR: 79.59 mL/min (ref >60.00)
Glucose, Bld: 107 mg/dL — ABNORMAL HIGH (ref 70–99)
Potassium: 3.7 mEq/L (ref 3.5–5.1)
SODIUM: 139 meq/L (ref 135–145)

## 2014-07-14 LAB — HEMOGLOBIN A1C: HEMOGLOBIN A1C: 7.2 % — AB (ref 4.6–6.5)

## 2014-07-14 MED ORDER — MUPIROCIN CALCIUM 2 % EX CREA: 1.0000 "application " | TOPICAL_CREAM | Freq: Two times a day (BID) | CUTANEOUS | Status: DC

## 2014-07-14 MED ORDER — LIRAGLUTIDE 18 MG/3ML ~~LOC~~ SOPN: PEN_INJECTOR | SUBCUTANEOUS | Status: DC

## 2014-07-14 NOTE — Progress Notes (Signed)
Patient ID: [name], female   DOB: [birth year], 58 y.o.   MRN: [id number]           Reason for Appointment: Follow-up for Type 2 Diabetes  Referring physician: Kathryne Eriksson  History of Present Illness:          Diagnosis: Type 2 diabetes mellitus, date of diagnosis:  2010       Past history:  [name] was having symptoms of neuropathy at onset At that time [name] was started on metformin alone, 500 mg twice a day and this was continued unchanged until recently Apparently [name] was also given Onglyza about 4 years ago but details of [name] control at that time is not available [name] A1c levels have been mild increase over the last year but [name] thinks [name] blood sugars had been generally good with only some readings up to 200. A1c was 7.1 in 1/15 and [name] was continued on metformin and Onglyza When [name] A1c was 7.9 in August [name] was told to double [name] metformin to 2 g daily   Recent history:  [name] has had blood sugars of 200 or more prior to [name] initial consultation [name] Trulicity was changed to Victoza because of diarrhea and abdominal pain [name] has titrated [name] Victoza to 1.2 mg and has no side effects. He does help a little with [name] satiety but has not lost any significant amount of weight [name] blood sugars have been erratic with some high readings at times [name] was also started on Amaryl 1 mg daily which [name] is taking in the morning instead of  evening as directed Also recently checking blood sugars mostly after supper No recent A1c available Current blood sugar patterns and problems:  Has only occasional fasting blood sugars which are inconsistent, today midday it was 205 without any food  Blood sugars after dinner are averaging about 140 but these are variable  Overall checking blood sugar irregularly recently  Hypoglycemia: None        Oral hypoglycemic drugs the patient is taking are: , metformin ER 2 g daily      Side effects from medications have been: Diarrhea, abdominal pain with  Trulicity Compliance with the medical regimen: Good  Glucose monitoring:  done once or twice a day         Glucometer:  One Touch       Blood Glucose readings from home monitor:   PRE-MEAL  fasting  Lunch  2-4 PM   PCS  Overall  Glucose range:  126-205    94-211   65-203    median:      147  148      Self-care: The diet that the patient has been following is: tries to limit fats.     Meals: 3 meals per day. Breakfast is eggs and bacon, lunch is a sandwich, and dinner at 5 pm will have baked chicken, starch and vegetables. Has snacks with yogurt, cottage cheese, fruit, nuts and  rice cakes           Exercise: walks up to 20 minutes, 3-7 days a week       Dietician visit, most recent: 7/15, is seeing the dietitian in [place]               Weight history:  Wt Readings from Last 3 Encounters:  07/14/14 227 lb 3.2 oz (103.057 kg)  06/28/14 224 lb (101.606 kg)  06/16/14 228 lb (103.42 kg)    Glycemic control: HBA1C 7.9  in 8/15   Lab Results  Component Value Date   HGBA1C 6.8* 05/11/2013   Lab Results  Component Value Date   LDLCALC 105* 05/11/2013   CREATININE 0.88 05/12/2013         Medication List       This list is accurate as of: 07/14/14  1:11 PM.  Always use your most recent med list.               acetaminophen 650 MG CR tablet  Commonly known as:  TYLENOL  Take 1,300 mg by mouth every 8 (eight) hours as needed for pain.     aspirin 81 MG EC tablet  Take 1 tablet (81 mg total) by mouth daily.     atorvastatin 20 MG tablet  Commonly known as:  LIPITOR  Take 1 tablet (20 mg total) by mouth daily.     Diclofenac Sodium 3 % Gel     gabapentin 600 MG tablet  Commonly known as:  NEURONTIN  Take 1 tablet 3 times per day as needed     glimepiride 1 MG tablet  Commonly known as:  AMARYL  Take 1 tablet (1 mg total) by mouth daily with breakfast.     glucose blood test strip  Commonly known as:  ONE TOUCH ULTRA TEST  Use as instructed to check blood sugar 2  times per day dx code E11.49     Liraglutide 18 MG/3ML Sopn  Commonly known as:  VICTOZA  Inject 0.6 mg daily for the first week, if no nausea or vomiting increase to 1.2 mg daily     lisinopril 20 MG tablet  Commonly known as:  PRINIVIL,ZESTRIL  Take 20 mg by mouth daily.     metFORMIN 500 MG 24 hr tablet  Commonly known as:  GLUCOPHAGE-XR  Take 2 tablets (1,000 mg total) by mouth every evening.     nitroGLYCERIN 0.4 MG SL tablet  Commonly known as:  NITROSTAT  Place 1 tablet (0.4 mg total) under the tongue every 5 (five) minutes as needed for chest pain.     ONETOUCH DELICA LANCETS FINE Misc  Use to check blood sugar 2 times per day dx code E11.49     spironolactone-hydrochlorothiazide 25-25 MG per tablet  Commonly known as:  ALDACTAZIDE     traMADol 50 MG tablet  Commonly known as:  ULTRAM  Take 50 mg by mouth every 6 (six) hours as needed.     Venlafaxine HCl 225 MG Tb24  Take 225 mg by mouth daily.        Allergies:  Allergies  Allergen Reactions  . Other     seasonal  . Relafen [Nabumetone]     Trouble breathing, break out  . Restasis [Cyclosporine]     Eyes burn and itch    Past Medical History  Diagnosis Date  . Diabetes mellitus without complication   . Hypertension   . Obesity (BMI 30-39.9)   . Depression 05/10/2013  . Osteopenia   . 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 Diverticulosis   . Irritable bowel   . Neuropathy associated with endocrine disorder   . Neuropathy   . Colon polyps     Past Surgical History  Procedure Laterality Date  . Arm surgery Right   . Plantar faci      Family History  Problem Relation Age of Onset  . Diabetes type II Mother   . Stroke Mother   . Hypertension Mother   .  Diabetes Mother   . Heart disease Father   . Hypertension Father   . Diabetes Father   . Heart disease Brother   . Hypertension Brother   . Diabetes Brother   . Kidney disease Brother   . Leukemia Other     Social  History:  reports that [name] has never smoked. [name] has never used smokeless tobacco. [name] reports that [name] does not drink alcohol or use illicit drugs.    Review of Systems   Most recent eye exam was 11/14       Lipids: These have been controlled with Lipitor 20 mg daily, last LDL in 1/15 was 72       Lab Results  Component Value Date   CHOL 175 05/11/2013   HDL 54 05/11/2013   LDLCALC 105* 05/11/2013   TRIG 79 05/11/2013   CHOLHDL 3.2 05/11/2013                   The blood pressure has been high for several years treated by PCP          [name] has a long history of Numbness, pain, tingling or burning in feet  Previously had significant difficulty with symptoms at night but with increasing [name] gabapentin to 600 mg 3 times a day [name] symptoms are better controlled.  Taking tramadol as needed    Foot exam in 10/15 showed:  Vibration sense is markedly reduced in toes. Ankle jerks are 1+ bilaterally.          Diabetic foot exam:  Absent in the toes on the right, decreased on the left great toe and absent on the smaller left toes And plantar surfaces    Physical Examination:  BP 141/88 mmHg  Pulse 70  Temp(Src) 97.9 F (36.6 C)  Resp 14  Ht 5' 6.5" (1.689 m)  Wt 227 lb 3.2 oz (103.057 kg)  BMI 36.13 kg/m2  SpO2 95%  No edema present    ASSESSMENT:  Diabetes type 2, uncontrolled with obesity Currently glucose control is improving with Victoza and [name] is tolerating this well However [name] blood sugars are quite inconsistent and not clear why [name] has some readings over 200 [name] has probably benefited somewhat from low-dose Amaryl in the morning, glucose was high today midday since [name] did not take it in the morning [name] has had some reduction in appetite with Victoza but is tolerating 1.2 mg currently Needs to do better with efforts to lose weight  Hyperlipidemia: Lipids to be assessed today  Hypertension: [name] pressure is relatively high again and will defer management to  PCP  PLAN:  To increase Victoza, discussed using 1.5 mg by dialing up 5 more clicks and if tolerated go to 1.8 mg More consistent blood sugars at various times Continue Amaryl unchanged for now and consider taking 2 twice a day fasting blood sugars are also high Consistent diet Check A1c today  Patient Instructions  Please check blood sugars at least half the time about 2 hours after any meal and 3 times per week on waking up. Please bring blood sugar monitor to each visit  Victoza 1.5 mg and if tolerated go to 1.8 after 4 days    Counseling time over 50% of today's 25 minute visit  Oney Tatlock 07/14/2014, 1:11 PM   Addendum: A1c 7.2, still slightly high Lipids and microalbumin okay Chemistry okay   Note: This office note was prepared with Estate agent. Any transcriptional errors that result from  this process are unintentional.  Office Visit on 07/14/2014  Component Date Value Ref Range Status  . Color, Urine 07/14/2014 YELLOW  Yellow;Lt. Yellow Final  . APPearance 07/14/2014 CLEAR  Clear Final  . Specific Gravity, Urine 07/14/2014 1.025  1.000-1.030 Final  . pH 07/14/2014 5.5  5.0 - 8.0 Final  . Total Protein, Urine 07/14/2014 NEGATIVE  Negative Final  . Urine Glucose 07/14/2014 NEGATIVE  Negative Final  . Ketones, ur 07/14/2014 NEGATIVE  Negative Final  . Bilirubin Urine 07/14/2014 NEGATIVE  Negative Final  . Hgb urine dipstick 07/14/2014 NEGATIVE  Negative Final  . Urobilinogen, UA 07/14/2014 0.2  0.0 - 1.0 Final  . Leukocytes, UA 07/14/2014 NEGATIVE  Negative Final  . Nitrite 07/14/2014 NEGATIVE  Negative Final  . WBC, UA 07/14/2014 0-2/hpf  0-2/hpf Final  . RBC / HPF 07/14/2014 0-2/hpf  0-2/hpf Final  . Squamous Epithelial / LPF 07/14/2014 Rare(0-4/hpf)  Rare(0-4/hpf) Final  . Bacteria, UA 07/14/2014 Rare(<10/hpf)* None Final  . Ca Oxalate Crys, UA 07/14/2014 Presence of* None Final  . Sodium 07/14/2014 139  135 - 145 mEq/L Final  .  Potassium 07/14/2014 3.7  3.5 - 5.1 mEq/L Final  . Chloride 07/14/2014 106  96 - 112 mEq/L Final  . CO2 07/14/2014 26  19 - 32 mEq/L Final  . Glucose, Bld 07/14/2014 107* 70 - 99 mg/dL Final  . BUN 07/14/2014 16  6 - 23 mg/dL Final  . Creatinine, Ser 07/14/2014 0.8  0.4 - 1.2 mg/dL Final  . Calcium 07/14/2014 9.2  8.4 - 10.5 mg/dL Final  . GFR 07/14/2014 79.59  >60.00 mL/min Final  . Hgb A1c MFr Bld 07/14/2014 7.2* 4.6 - 6.5 % Final   Glycemic Control Guidelines for People with Diabetes:Non Diabetic:  <6%Goal of Therapy: <7%Additional Action Suggested:  >8%   . Cholesterol 07/14/2014 184  0 - 200 mg/dL Final   ATP III Classification       Desirable:  < 200 mg/dL               Borderline High:  200 - 239 mg/dL          High:  > = 240 mg/dL  . Triglycerides 07/14/2014 153.0* 0.0 - 149.0 mg/dL Final   Normal:  <150 mg/dLBorderline High:  150 - 199 mg/dL  . HDL 07/14/2014 53.70  >39.00 mg/dL Final  . VLDL 07/14/2014 30.6  0.0 - 40.0 mg/dL Final  . LDL Cholesterol 07/14/2014 100* 0 - 99 mg/dL Final  . Total CHOL/HDL Ratio 07/14/2014 3   Final                  Men          Women1/2 Average Risk     3.4          3.3Average Risk          5.0          4.42X Average Risk          9.6          7.13X Average Risk          15.0          11.0                      . NonHDL 07/14/2014 130.30   Final   NOTE:  Non-HDL goal should be 30 mg/dL higher than patient's LDL goal (i.e. LDL goal of < 70 mg/dL, would have non-HDL goal  of < 100 mg/dL)  . Microalb, Ur 07/14/2014 0.6  0.0 - 1.9 mg/dL Final  . Creatinine,U 07/14/2014 154.3   Final  . Microalb Creat Ratio 07/14/2014 0.4  0.0 - 30.0 mg/g Final

## 2014-07-14 NOTE — Patient Instructions (Signed)
Please check blood sugars at least half the time about 2 hours after any meal and 3 times per week on waking up. Please bring blood sugar monitor to each visit  Victoza 1.5 mg and if tolerated go to 1.8 after 4 days

## 2014-08-25 ENCOUNTER — Ambulatory Visit (INDEPENDENT_AMBULATORY_CARE_PROVIDER_SITE_OTHER): Payer: 59 | Admitting: Endocrinology

## 2014-08-25 ENCOUNTER — Encounter: Payer: Self-pay | Admitting: Endocrinology

## 2014-08-25 VITALS — BP 106/80 | HR 78 | Temp 98.2°F | Resp 14 | Ht 66.5 in | Wt 224.8 lb

## 2014-08-25 DIAGNOSIS — IMO0002 Reserved for concepts with insufficient information to code with codable children: Secondary | ICD-10-CM

## 2014-08-25 DIAGNOSIS — E785 Hyperlipidemia, unspecified: Secondary | ICD-10-CM

## 2014-08-25 DIAGNOSIS — E1165 Type 2 diabetes mellitus with hyperglycemia: Secondary | ICD-10-CM

## 2014-08-25 MED ORDER — METFORMIN HCL ER 500 MG PO TB24: 2000.0000 mg | ORAL_TABLET | Freq: Every evening | ORAL | Status: DC

## 2014-08-25 NOTE — Progress Notes (Signed)
Patient ID: [name], female   DOB: 10-24-1955, 59 y.o.   MRN: [id number]           Reason for Appointment: Follow-up for Type 2 Diabetes  Referring physician: Kathryne Eriksson  History of Present Illness:          Diagnosis: Type 2 diabetes mellitus, date of diagnosis:  2010       Past history:  She was having symptoms of neuropathy at onset At that time she was started on metformin alone, 500 mg twice a day and this was continued unchanged until recently Apparently she was also given Onglyza about 4 years ago but details of her control at that time is not available Her A1c levels have been mild increase over the last year but she thinks her blood sugars had been generally good with only some readings up to 200. A1c was 7.1 in 1/15 and she was continued on metformin and Onglyza When her A1c was 7.9 in August she was told to double her metformin to 2 g daily  Recent history:  She has had blood sugars of 200 or more prior to her initial consultation Her Trulicity was changed to Victoza because of diarrhea and abdominal pain and has been able to take Victoza  1.8 without side effects Although this is helping her with portion control she still has sporadic high readings over 200 at various times She has lost a pound since her last visit She was also started on Amaryl 1 mg daily which she is taking in the morning to help with mealtime hyperglycemia Her A1c went from PCP was significantly better at 6.5 in 08/2014 done at the local lab However her blood sugars are averaging 160 at home Current blood sugar patterns and problems:  Has variability in her morning blood sugars which can be as low as 125 but last weekend as high as 188  She is checking blood sugars mostly in the evenings before or after supper and tends to have relatively high readings at times  Also has some high readings midday and after her breakfast today which had a relatively higher fat content  Blood sugars usually are  improved by bedtime, lowest blood sugar is 98 after supper  No hypoglycemia  She thinks that some of her blood sugars are significantly higher with stress  She does try to exercise and she thinks this helped bring her sugar down when it is high  Her highest median glucose is in the early afternoon of 218   Oral hypoglycemic drugs the patient is taking are: , metformin ER 2 g daily      Side effects from medications have been: Diarrhea, abdominal pain with Trulicity Compliance with the medical regimen: Good  Glucose monitoring:  done once or twice a day         Glucometer:  One Touch       Blood Glucose readings from home monitor: Unable to get median flow pre-and post prandial readings as meals are inconsistent times    PRE-MEAL Breakfast Lunch Dinner  9-11 PM  Overall  Glucose range:  125-188    113-247   98-291     median:     148   POST-MEAL PC Breakfast PC Lunch PC Dinner  Glucose range:  218, 282   99-164    Mean/median:       Self-care: The diet that the patient has been following is: tries to limit carbs.     Meals: 3  meals per day. Breakfast is eggs and bacon, lunch is a sandwich, and dinner at 5 pm will have baked chicken, starch and vegetables. Has snacks with yogurt, cottage cheese, fruit, nuts and  rice cakes           Exercise: walks up to 20 minutes, 6-7 days a week, has treadmill      Dietician visit, most recent: 9/15,              Weight history:  Wt Readings from Last 3 Encounters:  08/25/14 224 lb 12.8 oz (101.969 kg)  07/14/14 227 lb 3.2 oz (103.057 kg)  06/28/14 224 lb (101.606 kg)    Glycemic control: 6.5 A1C 7.9 in 8/15   Lab Results  Component Value Date   HGBA1C 7.2* 07/14/2014   HGBA1C 6.8* 05/11/2013   Lab Results  Component Value Date   MICROALBUR 0.6 07/14/2014   LDLCALC 100* 07/14/2014   CREATININE 0.8 07/14/2014         Medication List       This list is accurate as of: 08/25/14  9:23 PM.  Always use your most recent med list.                 acetaminophen 650 MG CR tablet  Commonly known as:  TYLENOL  Take 1,300 mg by mouth every 8 (eight) hours as needed for pain.     aspirin 81 MG EC tablet  Take 1 tablet (81 mg total) by mouth daily.     atorvastatin 20 MG tablet  Commonly known as:  LIPITOR  Take 1 tablet (20 mg total) by mouth daily.     Diclofenac Sodium 3 % Gel     gabapentin 600 MG tablet  Commonly known as:  NEURONTIN  Take 1 tablet 3 times per day as needed     glimepiride 1 MG tablet  Commonly known as:  AMARYL  Take 1 tablet (1 mg total) by mouth daily with breakfast.     glucose blood test strip  Commonly known as:  ONE TOUCH ULTRA TEST  Use as instructed to check blood sugar 2 times per day dx code E11.49     Liraglutide 18 MG/3ML Sopn  Commonly known as:  VICTOZA  1.8 mg daily     lisinopril 20 MG tablet  Commonly known as:  PRINIVIL,ZESTRIL  Take 20 mg by mouth daily.     metFORMIN 500 MG 24 hr tablet  Commonly known as:  GLUCOPHAGE-XR  Take 4 tablets (2,000 mg total) by mouth every evening.     mupirocin cream 2 %  Commonly known as:  BACTROBAN  Apply 1 application topically 2 (two) times daily.     nitroGLYCERIN 0.4 MG SL tablet  Commonly known as:  NITROSTAT  Place 1 tablet (0.4 mg total) under the tongue every 5 (five) minutes as needed for chest pain.     ONETOUCH DELICA LANCETS FINE Misc  Use to check blood sugar 2 times per day dx code E11.49     spironolactone-hydrochlorothiazide 25-25 MG per tablet  Commonly known as:  ALDACTAZIDE     traMADol 50 MG tablet  Commonly known as:  ULTRAM  Take 50 mg by mouth every 6 (six) hours as needed.     Venlafaxine HCl 225 MG Tb24  Take 225 mg by mouth daily.        Allergies:  Allergies  Allergen Reactions  . Other     seasonal  . Relafen [Nabumetone]  Trouble breathing, break out  . Restasis [Cyclosporine]     Eyes burn and itch    Past Medical History  Diagnosis Date  . Diabetes mellitus without  complication   . Hypertension   . Obesity (BMI 30-39.9)   . Depression 05/10/2013  . Osteopenia   . 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 Diverticulosis   . Irritable bowel   . Neuropathy associated with endocrine disorder   . Neuropathy   . Colon polyps     Past Surgical History  Procedure Laterality Date  . Arm surgery Right   . Plantar faci      Family History  Problem Relation Age of Onset  . Diabetes type II Mother   . Stroke Mother   . Hypertension Mother   . Diabetes Mother   . Heart disease Father   . Hypertension Father   . Diabetes Father   . Heart disease Brother   . Hypertension Brother   . Diabetes Brother   . Kidney disease Brother   . Leukemia Other     Social History:  reports that she has never smoked. She has never used smokeless tobacco. She reports that she does not drink alcohol or use illicit drugs.    Review of Systems   Most recent eye exam was 11/14       Lipids: These have been controlled with Lipitor 20 mg daily;  LDL from PCP is 79In 2/16       Lab Results  Component Value Date   CHOL 184 07/14/2014   HDL 53.70 07/14/2014   LDLCALC 100* 07/14/2014   TRIG 153.0* 07/14/2014   CHOLHDL 3 07/14/2014                  The blood pressure has been high for several years treated by PCP          She has a long history of Numbness, pain, tingling or burning in feet  Symptoms relieved with  gabapentin to 600 mg 3 times a day.  Taking tramadol as needed    Foot exam in 10/15 showed:  Vibration sense is markedly reduced in toes. Ankle jerks are 1+ bilaterally.          Diabetic foot exam:  Absent in the toes on the right, decreased on the left great toe and absent on the smaller left toes And plantar surfaces   Physical Examination:  BP 106/80 mmHg  Pulse 78  Temp(Src) 98.2 F (36.8 C)  Resp 14  Ht 5' 6.5" (1.689 m)  Wt 224 lb 12.8 oz (101.969 kg)  BMI 35.74 kg/m2  SpO2 95%  No edema present     ASSESSMENT:  Diabetes type 2, uncontrolled with obesity Currently glucose control is improving with Victoza  1.8 mg and her A1c is reportedly 6.5 from lab and PCPs office. However her blood sugars are  still quite inconsistent and not clear why she has some readings over 200; She thinks this is from stress and probably at times may be from higher fat meals  She has done well with trying to exercise but has lost only 3 pounds.  Hyperlipidemia: Well controlled on Lipitor  PLAN:   To continue same regimen for now Relatively low fat meals especially at breakfast May try taking Victoza at bedtime to avoid the transient nausea she has Follow-up in 3 months  Patient Instructions  Please check blood sugars at least half the time about 2 hours  after any meal and 3 times per week on waking up. Please bring blood sugar monitor to each visit. Recommended blood sugar levels about 2 hours after meal is 140-180 and on waking up 90-130     [name] 08/25/2014, 9:23 PM     Note: This office note was prepared with Estate agent. Any transcriptional errors that result from this process are unintentional.

## 2014-08-25 NOTE — Patient Instructions (Signed)
Please check blood sugars at least half the time about 2 hours after any meal and 3 times per week on waking up.  Please bring blood sugar monitor to each visit.  Recommended blood sugar levels about 2 hours after meal is 140-180 and on waking up 90-130   

## 2014-09-27 ENCOUNTER — Other Ambulatory Visit: Payer: Self-pay

## 2014-09-27 DIAGNOSIS — Z1231 Encounter for screening mammogram for malignant neoplasm of breast: Secondary | ICD-10-CM

## 2014-10-13 ENCOUNTER — Other Ambulatory Visit: Payer: Self-pay | Admitting: Endocrinology

## 2014-10-18 ENCOUNTER — Ambulatory Visit
Admission: RE | Admit: 2014-10-18 | Discharge: 2014-10-18 | Disposition: A | Discharge status: Home / Self Care | Payer: 59 | Source: Ambulatory Visit

## 2014-10-18 DIAGNOSIS — Z1231 Encounter for screening mammogram for malignant neoplasm of breast: Secondary | ICD-10-CM

## 2014-10-19 ENCOUNTER — Other Ambulatory Visit: Payer: Self-pay | Admitting: Family Medicine

## 2014-10-19 DIAGNOSIS — R928 Other abnormal and inconclusive findings on diagnostic imaging of breast: Secondary | ICD-10-CM

## 2014-10-23 ENCOUNTER — Other Ambulatory Visit: Payer: Self-pay | Admitting: Endocrinology

## 2014-10-27 ENCOUNTER — Ambulatory Visit
Admission: RE | Admit: 2014-10-27 | Discharge: 2014-10-27 | Disposition: A | Discharge status: Home / Self Care | Payer: 59 | Source: Ambulatory Visit | Attending: Family Medicine | Admitting: Family Medicine

## 2014-10-27 DIAGNOSIS — R928 Other abnormal and inconclusive findings on diagnostic imaging of breast: Secondary | ICD-10-CM

## 2014-11-22 ENCOUNTER — Encounter: Payer: Self-pay | Admitting: Endocrinology

## 2014-11-22 ENCOUNTER — Ambulatory Visit (INDEPENDENT_AMBULATORY_CARE_PROVIDER_SITE_OTHER): Payer: BLUE CROSS/BLUE SHIELD | Admitting: Endocrinology

## 2014-11-22 VITALS — BP 118/80 | HR 72 | Temp 97.9°F | Resp 16 | Ht 66.5 in | Wt 230.8 lb

## 2014-11-22 DIAGNOSIS — E785 Hyperlipidemia, unspecified: Secondary | ICD-10-CM

## 2014-11-22 DIAGNOSIS — IMO0002 Reserved for concepts with insufficient information to code with codable children: Secondary | ICD-10-CM

## 2014-11-22 DIAGNOSIS — E1165 Type 2 diabetes mellitus with hyperglycemia: Secondary | ICD-10-CM | POA: Diagnosis not present

## 2014-11-22 LAB — BASIC METABOLIC PANEL
BUN: 11 mg/dL (ref 6–23)
CO2: 27 mEq/L (ref 19–32)
Calcium: 9.1 mg/dL (ref 8.4–10.5)
Chloride: 103 mEq/L (ref 96–112)
Creatinine, Ser: 0.77 mg/dL (ref 0.40–1.20)
GFR: 81.88 mL/min (ref >60.00)
Glucose, Bld: 115 mg/dL — ABNORMAL HIGH (ref 70–99)
Potassium: 3.7 mEq/L (ref 3.5–5.1)
SODIUM: 136 meq/L (ref 135–145)

## 2014-11-22 LAB — HEMOGLOBIN A1C: HEMOGLOBIN A1C: 7 % — AB (ref 4.6–6.5)

## 2014-11-22 NOTE — Progress Notes (Signed)
Quick Note:  Please let patient know that the A1c is slightly better at 7%  ______

## 2014-11-22 NOTE — Patient Instructions (Signed)
Reduce Metformin 1 twice daily  Glimeperide in pm= 2 pills  Please check blood sugars at least half the time about 2 hours after any meal and 3 times per week on waking up.  Please bring blood sugar monitor to each visit.  Recommended blood sugar levels about 2 hours after meal is 140-180 and on waking up 90-130, call if sugar out of range

## 2014-11-22 NOTE — Progress Notes (Signed)
Patient ID: [name], female   DOB: July 31, 1955, 59 y.o.   MRN: [id number]           Reason for Appointment: Follow-up for Type 2 Diabetes  Referring physician: Kathryne Eriksson  History of Present Illness:          Diagnosis: Type 2 diabetes mellitus, date of diagnosis:  2010       Past history:  [name] was having symptoms of neuropathy at onset At that time [name] was started on metformin alone, 500 mg twice a day and this was continued unchanged until recently Apparently [name] was also given Onglyza about 4 years ago but details of [name] control at that time is not available [name] A1c levels have been mild increase over the last year but [name] thinks [name] blood sugars had been generally good with only some readings up to 200. A1c was 7.1 in 1/15 and [name] was continued on metformin and Onglyza When [name] A1c was 7.9 in August [name] was told to double [name] metformin to 2 g daily [name]  had blood sugars of 200 or more prior to [name] initial consultation  Recent history:  [name] Trulicity was changed to Victoza because of diarrhea and abdominal pain and has been able to take Victoza  1.8 hs without side effects  [name] was also started on Amaryl 1 mg daily which [name] is taking at night now  [name] A1c last done from PCP was significantly better at 6.5 in 08/2014 done at the local lab More recently however has not had consistent blood sugar levels From [name] download [name] blood sugars are averaging 160 at home Current blood sugar patterns and problems:  Has not checked blood sugars before first meal very much lately and these are mostly high  Blood sugars are generally better in the afternoon  [name] thinks blood sugars are better in the afternoon on the days [name] is exercising  Has only sporadic readings in the evenings and does not appear to have high readings after supper on 2 occasions; however on 5/1 [name] had reading of 377 at night  Not exercising in systole  Not clear why [name] has gained weight, [name] thinks [name]  portions are still small   Oral hypoglycemic drugs the patient is taking are:  metformin ER 2 g daily      Side effects from medications have been: Diarrhea, abdominal pain with Trulicity Compliance with the medical regimen: Good  Glucose monitoring:  done once or twice a day         Glucometer:  One Touch       Blood Glucose readings from home monitor:    PRE-MEAL Breakfast Lunch  afternoon  Bedtime Overall  Glucose range:  165-191   118-185   98-183   136-377    median:     153   Self-care: The diet that the patient has been following is: tries to limit carbs.     Meals: 3 meals per day. Breakfast is yogurt/ cereal; , lunch is a sandwich, and dinner at 5 pm will have baked chicken, starch and vegetables. Has snacks with yogurt, cottage cheese, fruit, nuts and  rice cakes           Exercise: walks up to 20 minutes, 3-6 days a week, has treadmill      Dietician visit, most recent: 9/15,              Weight history:  Wt Readings from Last 3 Encounters:  11/22/14 230 lb 12.8 oz (104.69 kg)  08/25/14 224 lb 12.8 oz (101.969 kg)  07/14/14 227 lb 3.2 oz (103.057 kg)    Glycemic control: 6.5 A1C 7.9 in 8/15   Lab Results  Component Value Date   HGBA1C 7.2* 07/14/2014   HGBA1C 6.8* 05/11/2013   Lab Results  Component Value Date   MICROALBUR 0.6 07/14/2014   LDLCALC 100* 07/14/2014   CREATININE 0.8 07/14/2014         Medication List       This list is accurate as of: 11/22/14  1:50 PM.  Always use your most recent med list.               acetaminophen 650 MG CR tablet  Commonly known as:  TYLENOL  Take 1,300 mg by mouth every 8 (eight) hours as needed for pain.     aspirin 81 MG EC tablet  Take 1 tablet (81 mg total) by mouth daily.     atorvastatin 20 MG tablet  Commonly known as:  LIPITOR  Take 1 tablet (20 mg total) by mouth daily.     Diclofenac Sodium 3 % Gel     gabapentin 600 MG tablet  Commonly known as:  NEURONTIN  Take 1 tablet 3 times per day as  needed     glimepiride 1 MG tablet  Commonly known as:  AMARYL  TAKE ONE TABLET BY MOUTH DAILY WITH BREAKFAST     glucose blood test strip  Commonly known as:  ONE TOUCH ULTRA TEST  Use as instructed to check blood sugar 2 times per day dx code E11.49     lisinopril 20 MG tablet  Commonly known as:  PRINIVIL,ZESTRIL  Take 20 mg by mouth daily.     metFORMIN 500 MG 24 hr tablet  Commonly known as:  GLUCOPHAGE-XR  Take 4 tablets (2,000 mg total) by mouth every evening.     mupirocin cream 2 %  Commonly known as:  BACTROBAN  Apply 1 application topically 2 (two) times daily.     nitroGLYCERIN 0.4 MG SL tablet  Commonly known as:  NITROSTAT  Place 1 tablet (0.4 mg total) under the tongue every 5 (five) minutes as needed for chest pain.     ONETOUCH DELICA LANCETS FINE Misc  Use to check blood sugar 2 times per day dx code E11.49     spironolactone-hydrochlorothiazide 25-25 MG per tablet  Commonly known as:  ALDACTAZIDE     traMADol 50 MG tablet  Commonly known as:  ULTRAM  Take 50 mg by mouth every 6 (six) hours as needed.     Venlafaxine HCl 225 MG Tb24  Take 225 mg by mouth daily.     VICTOZA 18 MG/3ML Sopn  Generic drug:  Liraglutide  INJECT 1.8 MG SUBCUTANEOUSLY ONCE DAILY        Allergies:  Allergies  Allergen Reactions  . Other     seasonal  . Relafen [Nabumetone]     Trouble breathing, break out  . Restasis [Cyclosporine]     Eyes burn and itch    Past Medical History  Diagnosis Date  . Diabetes mellitus without complication   . Hypertension   . Obesity (BMI 30-39.9)   . Depression 05/10/2013  . Osteopenia   . 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 Diverticulosis   . Irritable bowel   . Neuropathy associated with endocrine disorder   . Neuropathy   . Colon polyps  Past Surgical History  Procedure Laterality Date  . Arm surgery Right   . Plantar faci      Family History  Problem Relation Age of Onset  . Diabetes  type II Mother   . Stroke Mother   . Hypertension Mother   . Diabetes Mother   . Heart disease Father   . Hypertension Father   . Diabetes Father   . Heart disease Brother   . Hypertension Brother   . Diabetes Brother   . Kidney disease Brother   . Leukemia Other     Social History:  reports that [name] has never smoked. [name] has never used smokeless tobacco. [name] reports that [name] does not drink alcohol or use illicit drugs.    Review of Systems   Asking about irritable bowel syndrome and more recent symptoms  Most recent eye exam was 11/15       Lipids: These have been controlled with Lipitor 20 mg daily;  LDL from PCP is 79 In 2/16       Lab Results  Component Value Date   CHOL 184 07/14/2014   HDL 53.70 07/14/2014   LDLCALC 100* 07/14/2014   TRIG 153.0* 07/14/2014   CHOLHDL 3 07/14/2014                  The blood pressure has been high for several years treated by PCP          [name] has a long history of Numbness, pain, tingling or burning in feet  Symptoms relieved with  gabapentin to 600 mg 3 times a day.  Taking tramadol as needed    Foot exam in 10/15 showed:  Vibration sense is markedly reduced in toes. Ankle jerks are 1+ bilaterally.          Diabetic foot exam:  Absent in the toes on the right, decreased on the left great toe and absent on the smaller left toes And plantar surfaces   Physical Examination:  BP 118/80 mmHg  Pulse 72  Temp(Src) 97.9 F (36.6 C)  Resp 16  Ht 5' 6.5" (1.689 m)  Wt 230 lb 12.8 oz (104.69 kg)  BMI 36.70 kg/m2  SpO2 95%  Trace  edema present    ASSESSMENT:  Diabetes type 2, uncontrolled with obesity Currently glucose control needs to be assessed with another A1c now Although [name] had an A1c of 6.5 previously and [name] blood sugars are not overall higher on this visit [name] has not monitored as consistently Appears to have higher readings fasting when [name] checks them Also may have high readings on the afternoons when [name] is not  exercising but overall average blood sugar is still about 160  Since [name] is having more GI symptoms currently may not be tolerating metformin well  PLAN:   To temporarily reduce metformin to half the dose and double the Amaryl Continue 1.8 Victoza Check A1c Follow-up in 3 months  Follow-up with gastroenterologist for management of [name] IBS  Patient Instructions  Reduce Metformin 1 twice daily  Glimeperide in pm= 2 pills  Please check blood sugars at least half the time about 2 hours after any meal and 3 times per week on waking up.  Please bring blood sugar monitor to each visit.  Recommended blood sugar levels about 2 hours after meal is 140-180 and on waking up 90-130, call if sugar out of range     Nicolina Hirt 11/22/2014, 1:50 PM     Note: This office note was  prepared with Estate agent. Any transcriptional errors that result from this process are unintentional.

## 2015-01-13 ENCOUNTER — Other Ambulatory Visit: Payer: Self-pay | Admitting: Endocrinology

## 2015-02-23 ENCOUNTER — Encounter: Payer: Self-pay | Admitting: Endocrinology

## 2015-02-23 ENCOUNTER — Other Ambulatory Visit: Payer: Self-pay | Admitting: Endocrinology

## 2015-02-23 ENCOUNTER — Ambulatory Visit (INDEPENDENT_AMBULATORY_CARE_PROVIDER_SITE_OTHER): Payer: BLUE CROSS/BLUE SHIELD | Admitting: Endocrinology

## 2015-02-23 VITALS — BP 108/70 | HR 74 | Temp 97.7°F | Resp 16 | Ht 66.5 in | Wt 227.6 lb

## 2015-02-23 DIAGNOSIS — E1165 Type 2 diabetes mellitus with hyperglycemia: Secondary | ICD-10-CM

## 2015-02-23 DIAGNOSIS — IMO0002 Reserved for concepts with insufficient information to code with codable children: Secondary | ICD-10-CM

## 2015-02-23 DIAGNOSIS — E785 Hyperlipidemia, unspecified: Secondary | ICD-10-CM | POA: Diagnosis not present

## 2015-02-23 LAB — COMPREHENSIVE METABOLIC PANEL
ALK PHOS: 120 U/L — AB (ref 39–117)
ALT: 23 U/L (ref 0–35)
AST: 18 U/L (ref 0–37)
Albumin: 4 g/dL (ref 3.5–5.2)
BILIRUBIN TOTAL: 0.5 mg/dL (ref 0.2–1.2)
BUN: 9 mg/dL (ref 6–23)
CO2: 28 mEq/L (ref 19–32)
Calcium: 9.4 mg/dL (ref 8.4–10.5)
Chloride: 102 mEq/L (ref 96–112)
Creatinine, Ser: 0.89 mg/dL (ref 0.40–1.20)
GFR: 69.21 mL/min (ref >60.00)
GLUCOSE: 124 mg/dL — AB (ref 70–99)
Potassium: 3.8 mEq/L (ref 3.5–5.1)
SODIUM: 138 meq/L (ref 135–145)
TOTAL PROTEIN: 6.9 g/dL (ref 6.0–8.3)

## 2015-02-23 LAB — HEMOGLOBIN A1C: Hgb A1c MFr Bld: 6.9 % — ABNORMAL HIGH (ref 4.6–6.5)

## 2015-02-23 MED ORDER — METFORMIN HCL ER 500 MG PO TB24: 2000.0000 mg | ORAL_TABLET | Freq: Every evening | ORAL | Status: DC

## 2015-02-23 MED ORDER — LIRAGLUTIDE 18 MG/3ML ~~LOC~~ SOPN: PEN_INJECTOR | SUBCUTANEOUS | Status: DC

## 2015-02-23 MED ORDER — GLIMEPIRIDE 1 MG PO TABS: 1.0000 mg | ORAL_TABLET | Freq: Every day | ORAL | Status: DC

## 2015-02-23 NOTE — Patient Instructions (Signed)
Take 2 metformin at dinner, 1 in am  Take Glimeperide 1/2 twice daily  Check blood sugars on waking up .Marland Kitchen2-3  .Marland Kitchen times a week Also check blood sugars about 2 hours after a meal and do this after different meals by rotation  Recommended blood sugar levels on waking up is 90-130 and about 2 hours after meal is 140-180 Please bring blood sugar monitor to each visit.

## 2015-02-23 NOTE — Progress Notes (Signed)
Patient ID: [name], female   DOB: [birth year], 59 y.o.   MRN: [id number]           Reason for Appointment: Follow-up for Type 2 Diabetes  Referring physician: Kathryne Eriksson  History of Present Illness:          Diagnosis: Type 2 diabetes mellitus, date of diagnosis:  2010       Past history:  She was having symptoms of neuropathy at onset At that time she was started on metformin alone, 500 mg twice a day and this was continued unchanged until recently Apparently she was also given Onglyza about 4 years ago but details of her control at that time is not available Her A1c levels have been mild increase over the last year but she thinks her blood sugars had been generally good with only some readings up to 200. A1c was 7.1 in 1/15 and she was continued on metformin and Onglyza When her A1c was 7.9 in August she was told to double her metformin to 2 g daily She  had blood sugars of 200 or more prior to her initial consultation  Recent history:  Her Trulicity was changed to Victoza because of diarrhea and abdominal pain and has been able to take Victoza  1.8 hs without side effects  She was also started on Amaryl 1 mg daily along with metformin Her last A1c was 7% and because of tendency to diarrhea she was told to reduce her metformin to 500 mg twice a day and also increase her Amaryl to twice a day However since she did not have more than 30 tablets in her Amaryl prescription she is taking this only once in the morning now  Current blood sugar patterns and problems identified:  Her blood sugars have been overall averaging about 150  She has not checked her sugars in the mornings but today it was 187  She tends to have sporadic high readings in the afternoons and evenings probably from variable diet.  Blood sugars tend to be low normal in the early afternoon  She has started back to losing some weight and is trying to exercise regularly at the gym recently   Oral hypoglycemic  drugs the patient is taking are:  metformin ER 2 g daily      Side effects from medications have been: Diarrhea, abdominal pain with Trulicity Compliance with the medical regimen: Good  Glucose monitoring:  done once or twice a day         Glucometer:  One Touch       Blood Glucose readings from home monitor:    Mean values apply above for all meters except median for One Touch  PRE-MEAL Fasting Lunch Dinner  PCS  Overall  Glucose range:  187   71-251   107-240   Mean/median:      149     Self-care: The diet that the patient has been following is: tries to limit carbs.     Meals: 3 meals per day. Breakfast is yogurt/ cereal; , lunch is a sandwich, and dinner at 5 pm will have baked chicken, starch and vegetables. Has snacks with yogurt, cottage cheese, fruit, nuts and  rice cakes           Exercise: walks up to 20 minutes, 3-6 days a week, on treadmill      Dietician visit, most recent: 9/15,              Weight history:  Wt Readings from Last 3 Encounters:  02/23/15 227 lb 9.6 oz (103.239 kg)  11/22/14 230 lb 12.8 oz (104.69 kg)  08/25/14 224 lb 12.8 oz (101.969 kg)    Glycemic control:  A1C 7.9 in 8/15   Lab Results  Component Value Date   HGBA1C 7.0* 11/22/2014   HGBA1C 7.2* 07/14/2014   HGBA1C 6.8* 05/11/2013   Lab Results  Component Value Date   MICROALBUR 0.6 07/14/2014   LDLCALC 100* 07/14/2014   CREATININE 0.77 11/22/2014         Medication List       This list is accurate as of: 02/23/15  4:23 PM.  Always use your most recent med list.               acetaminophen 650 MG CR tablet  Commonly known as:  TYLENOL  Take 1,300 mg by mouth every 8 (eight) hours as needed for pain.     aspirin 81 MG EC tablet  Take 1 tablet (81 mg total) by mouth daily.     atorvastatin 20 MG tablet  Commonly known as:  LIPITOR  Take 1 tablet (20 mg total) by mouth daily.     Diclofenac Sodium 3 % Gel     gabapentin 600 MG tablet  Commonly known as:  NEURONTIN  TAKE  1 TABLET 3 TIMES PER DAY AS NEEDED     glimepiride 1 MG tablet  Commonly known as:  AMARYL  Take 1 tablet (1 mg total) by mouth daily with breakfast.     Liraglutide 18 MG/3ML Sopn  Commonly known as:  VICTOZA  INJECT 1.8 MG SUBCUTANEOUSLY  ONCE DAILY     lisinopril 20 MG tablet  Commonly known as:  PRINIVIL,ZESTRIL  Take 20 mg by mouth daily.     metFORMIN 500 MG 24 hr tablet  Commonly known as:  GLUCOPHAGE-XR  Take 4 tablets (2,000 mg total) by mouth every evening.     mupirocin cream 2 %  Commonly known as:  BACTROBAN  Apply 1 application topically 2 (two) times daily.     nitroGLYCERIN 0.4 MG SL tablet  Commonly known as:  NITROSTAT  Place 1 tablet (0.4 mg total) under the tongue every 5 (five) minutes as needed for chest pain.     ONE TOUCH ULTRA TEST test strip  Generic drug:  glucose blood  USE AS INSTRUCTED TO CHECK BLOOD SUGAR 2 TIMES PER DAY DX CODE F62.13     ONETOUCH DELICA LANCETS FINE Misc  Use to check blood sugar 2 times per day dx code E11.49     traMADol 50 MG tablet  Commonly known as:  ULTRAM  Take 50 mg by mouth every 6 (six) hours as needed.     Venlafaxine HCl 225 MG Tb24  Take 225 mg by mouth daily.        Allergies:  Allergies  Allergen Reactions  . Other     seasonal  . Relafen [Nabumetone]     Trouble breathing, break out  . Restasis [Cyclosporine]     Eyes burn and itch    Past Medical History  Diagnosis Date  . Diabetes mellitus without complication   . Hypertension   . Obesity (BMI 30-39.9)   . Depression 05/10/2013  . Osteopenia   . 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 Diverticulosis   . Irritable bowel   . Neuropathy associated with endocrine disorder   . Neuropathy   . Colon polyps  Past Surgical History  Procedure Laterality Date  . Arm surgery Right   . Plantar faci      Family History  Problem Relation Age of Onset  . Diabetes type II Mother   . Stroke Mother   . Hypertension  Mother   . Diabetes Mother   . Heart disease Father   . Hypertension Father   . Diabetes Father   . Heart disease Brother   . Hypertension Brother   . Diabetes Brother   . Kidney disease Brother   . Leukemia Other     Social History:  reports that she has never smoked. She has never used smokeless tobacco. She reports that she does not drink alcohol or use illicit drugs.    Review of Systems   Most recent eye exam was 11/15       Lipids: These have been controlled with Lipitor 20 mg daily;  LDL from PCP is 79 In 2/16       Lab Results  Component Value Date   CHOL 184 07/14/2014   HDL 53.70 07/14/2014   LDLCALC 100* 07/14/2014   TRIG 153.0* 07/14/2014   CHOLHDL 3 07/14/2014                  The blood pressure has been high for several years treated by PCP And is due for follow-up           She has a long history of Numbness, pain, tingling or burning in feet  Symptoms relieved with  gabapentin to 600 mg 3 times a day.  Taking tramadol as needed    Foot exam in 10/15 showed:  Vibration sense is markedly reduced in toes. Ankle jerks are 1+ bilaterally.          Diabetic foot exam:  Absent in the toes on the right, decreased on the left great toe and absent on the smaller left toes And plantar surfaces   Physical Examination:  BP 108/70 mmHg  Pulse 74  Temp(Src) 97.7 F (36.5 C)  Resp 16  Ht 5' 6.5" (1.689 m)  Wt 227 lb 9.6 oz (103.239 kg)  BMI 36.19 kg/m2  SpO2 96%     ASSESSMENT:  Diabetes type 2, uncontrolled with obesity She is on a regimen of Victoza, metformin and Amaryl  Current glucose control needs to be assessed with A1c today Her blood sugars are inconsistent and then to be sporadically higher after certain meals  Also she thinks her fasting readings are high even though she has not checked them consistently  She is on a reduced dose of metformin because of previous GI symptoms possibly from her irritable bowel Compliance with exercise is improving  and she is exercising at the gym She can do better with her diet can be more consistent   PLAN:   To try taking additional 500 mg metformin ER at dinnertime Take half tablet of Amaryl twice a day More consistent monitoring at various times, discussed blood sugar targets  Patient Instructions  Take 2 metformin at dinner, 1 in am  Take Glimeperide 1/2 twice daily  Check blood sugars on waking up .Marland Kitchen2-3  .Marland Kitchen times a week Also check blood sugars about 2 hours after a meal and do this after different meals by rotation  Recommended blood sugar levels on waking up is 90-130 and about 2 hours after meal is 140-180 Please bring blood sugar monitor to each visit.     [name] 02/23/2015, 4:23 PM  Note: This office note was prepared with Estate agent. Any transcriptional errors that result from this process are unintentional.

## 2015-02-24 NOTE — Progress Notes (Signed)
Quick Note:  Please let patient know that the A1c is 6.9, reasonably good  ______

## 2015-05-25 ENCOUNTER — Other Ambulatory Visit (INDEPENDENT_AMBULATORY_CARE_PROVIDER_SITE_OTHER): Payer: BLUE CROSS/BLUE SHIELD

## 2015-05-25 ENCOUNTER — Other Ambulatory Visit: Payer: Self-pay | Admitting: *Deleted

## 2015-05-25 ENCOUNTER — Encounter: Payer: Self-pay | Admitting: Endocrinology

## 2015-05-25 ENCOUNTER — Ambulatory Visit (INDEPENDENT_AMBULATORY_CARE_PROVIDER_SITE_OTHER): Payer: BLUE CROSS/BLUE SHIELD | Admitting: Endocrinology

## 2015-05-25 VITALS — BP 122/72 | HR 84 | Temp 98.2°F | Resp 16 | Ht 66.5 in | Wt 231.6 lb

## 2015-05-25 DIAGNOSIS — I10 Essential (primary) hypertension: Secondary | ICD-10-CM

## 2015-05-25 DIAGNOSIS — E1140 Type 2 diabetes mellitus with diabetic neuropathy, unspecified: Secondary | ICD-10-CM

## 2015-05-25 DIAGNOSIS — E1165 Type 2 diabetes mellitus with hyperglycemia: Secondary | ICD-10-CM

## 2015-05-25 DIAGNOSIS — E1142 Type 2 diabetes mellitus with diabetic polyneuropathy: Secondary | ICD-10-CM | POA: Diagnosis not present

## 2015-05-25 DIAGNOSIS — IMO0002 Reserved for concepts with insufficient information to code with codable children: Secondary | ICD-10-CM

## 2015-05-25 LAB — MICROALBUMIN / CREATININE URINE RATIO
Creatinine,U: 126.4 mg/dL
Microalb Creat Ratio: 0.6 mg/g (ref 0.0–30.0)
Microalb, Ur: <0.7 mg/dL (ref 0.0–1.9)

## 2015-05-25 LAB — LIPID PANEL
Cholesterol: 150 mg/dL (ref 0–200)
HDL: 51.8 mg/dL (ref >39.00)
LDL Cholesterol: 65 mg/dL (ref 0–99)
NonHDL: 97.97
Total CHOL/HDL Ratio: 3
Triglycerides: 165 mg/dL — ABNORMAL HIGH (ref 0.0–149.0)
VLDL: 33 mg/dL (ref 0.0–40.0)

## 2015-05-25 LAB — BASIC METABOLIC PANEL
BUN: 17 mg/dL (ref 6–23)
CHLORIDE: 107 meq/L (ref 96–112)
CO2: 21 mEq/L (ref 19–32)
Calcium: 9 mg/dL (ref 8.4–10.5)
Creatinine, Ser: 1.14 mg/dL (ref 0.40–1.20)
GFR: 51.97 mL/min — AB (ref >60.00)
GLUCOSE: 232 mg/dL — AB (ref 70–99)
POTASSIUM: 3.7 meq/L (ref 3.5–5.1)
Sodium: 139 mEq/L (ref 135–145)

## 2015-05-25 LAB — POCT GLYCOSYLATED HEMOGLOBIN (HGB A1C): HEMOGLOBIN A1C: 7.6

## 2015-05-25 MED ORDER — CANAGLIFLOZIN 100 MG PO TABS: ORAL_TABLET | ORAL | Status: DC

## 2015-05-25 NOTE — Progress Notes (Signed)
Patient ID: [name], female   DOB: 1956/02/01, 59 y.o.   MRN: [id number]           Reason for Appointment: Follow-up for Type 2 Diabetes  Referring physician: Kathryne Eriksson  History of Present Illness:          Diagnosis: Type 2 diabetes mellitus, date of diagnosis:  2010       Past history:  She was having symptoms of neuropathy at onset At that time she was started on metformin alone, 500 mg twice a day and this was continued unchanged until recently Apparently she was also given Onglyza about 4 years ago but details of her control at that time is not available Her A1c levels have been mild increase over the last year but she thinks her blood sugars had been generally good with only some readings up to 200. A1c was 7.1 in 1/15 and she was continued on metformin and Onglyza When her A1c was 7.9 in August she was told to double her metformin to 2 g daily She  had blood sugars of 200 or more prior to her initial consultation  Recent history:    Non-insulin hypoglycemic drugs the patient is taking are:  metformin ER 1.5 g daily Amaryl 1/2 bid , Victoza 1.8 mg      Her Trulicity was changed to Victoza because of diarrhea and abdominal pain and has been able to take Victoza  1.8 hs without side effects  She was also started on Amaryl 1 mg daily along with metformin She is now able to take metformin 3 tablets a day now  Current blood sugar patterns and problems identified:  Her blood sugars have been overall higher than before and averaging about 180  Difficult to get a blood sugar pattern as she is checking blood sugars mostly midday and afternoon and some in evenings and her monitor has conflicting dates and times recorded  Appears to have fasting hyperglycemia, she generally is not eating her first meal until early afternoon  She has gained a little weight and this is despite continuing Victoza  Blood sugars tend to be low normal in the early afternoon but mostly after  exercise   Side effects from medications have been: Diarrhea, abdominal pain with Trulicity Compliance with the medical regimen: Good  Glucose monitoring:  done once or twice a day         Glucometer:  One Touch       Blood Glucose readings from home monitor:    Mean values apply above for all meters except median for One Touch  PRE-MEAL Fasting Lunch Dinner Bedtime Overall  Glucose range:  166-298       Mean/median:      180    POST-MEAL PC Breakfast PC Lunch PC Dinner  Glucose range:  72-196   124-580   111-299   Mean/median:       Self-care: The diet that the patient has been following is: tries to limit carbs.     Meals: 3 meals per day. Breakfast is yogurt/ cereal at 9 am; , lunch is a sandwich, and dinner at 5 pm will have baked chicken, starch and vegetables. Has snacks with yogurt, cottage cheese, fruit, nuts and  rice cakes            Exercise: walks up to 20 minutes, 3-6 days a week, on treadmill mostly 1 pm      Dietician visit, most recent: 9/15,  Weight history:  Wt Readings from Last 3 Encounters:  05/25/15 231 lb 9.6 oz (105.053 kg)  02/23/15 227 lb 9.6 oz (103.239 kg)  11/22/14 230 lb 12.8 oz (104.69 kg)    Glycemic control:  A1C 7.9 in 8/15   Lab Results  Component Value Date   HGBA1C 7.6 05/25/2015   HGBA1C 6.9* 02/23/2015   HGBA1C 7.0* 11/22/2014   Lab Results  Component Value Date   MICROALBUR <0.7 05/25/2015   LDLCALC 65 05/25/2015   CREATININE 1.14 05/25/2015         Medication List       This list is accurate as of: 05/25/15  9:35 PM.  Always use your most recent med list.               acetaminophen 650 MG CR tablet  Commonly known as:  TYLENOL  Take 1,300 mg by mouth every 8 (eight) hours as needed for pain.     aspirin 81 MG EC tablet  Take 1 tablet (81 mg total) by mouth daily.     atorvastatin 20 MG tablet  Commonly known as:  LIPITOR  Take 1 tablet (20 mg total) by mouth daily.     canagliflozin 100 MG Tabs  tablet  Commonly known as:  INVOKANA  1 tablet before breakfast     Diclofenac Sodium 3 % Gel     gabapentin 600 MG tablet  Commonly known as:  NEURONTIN  TAKE 1 TABLET 3 TIMES PER DAY AS NEEDED     glimepiride 1 MG tablet  Commonly known as:  AMARYL  Take 1 tablet (1 mg total) by mouth daily with breakfast.     Liraglutide 18 MG/3ML Sopn  Commonly known as:  VICTOZA  INJECT 1.8 MG SUBCUTANEOUSLY  ONCE DAILY     lisinopril 20 MG tablet  Commonly known as:  PRINIVIL,ZESTRIL  Take 20 mg by mouth daily.     metFORMIN 500 MG 24 hr tablet  Commonly known as:  GLUCOPHAGE-XR  Take 4 tablets (2,000 mg total) by mouth every evening.     mupirocin cream 2 %  Commonly known as:  BACTROBAN  Apply 1 application topically 2 (two) times daily.     nitroGLYCERIN 0.4 MG SL tablet  Commonly known as:  NITROSTAT  Place 1 tablet (0.4 mg total) under the tongue every 5 (five) minutes as needed for chest pain.     ONE TOUCH ULTRA TEST test strip  Generic drug:  glucose blood  USE AS INSTRUCTED TO CHECK BLOOD SUGAR 2 TIMES PER DAY DX CODE F81.82     ONETOUCH DELICA LANCETS FINE Misc  Use to check blood sugar 2 times per day dx code E11.49     traMADol 50 MG tablet  Commonly known as:  ULTRAM  Take 50 mg by mouth every 6 (six) hours as needed.     Venlafaxine HCl 225 MG Tb24  Take 225 mg by mouth daily.        Allergies:  Allergies  Allergen Reactions  . Other     seasonal  . Relafen [Nabumetone]     Trouble breathing, break out  . Restasis [Cyclosporine]     Eyes burn and itch    Past Medical History  Diagnosis Date  . Diabetes mellitus without complication ([place])   . Hypertension   . Obesity (BMI 30-39.9)   . Depression 05/10/2013  . Osteopenia   . 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  Diverticulosis   . Irritable bowel   . Neuropathy associated with endocrine disorder ([place])   . Neuropathy ([place])   . Colon polyps     Past Surgical History    Procedure Laterality Date  . Arm surgery Right   . Plantar faci      Family History  Problem Relation Age of Onset  . Diabetes type II Mother   . Stroke Mother   . Hypertension Mother   . Diabetes Mother   . Heart disease Father   . Hypertension Father   . Diabetes Father   . Heart disease Brother   . Hypertension Brother   . Diabetes Brother   . Kidney disease Brother   . Leukemia Other     Social History:  reports that she has never smoked. She has never used smokeless tobacco. She reports that she does not drink alcohol or use illicit drugs.    Review of Systems   Most recent eye exam was 11/15       Lipids: These have been controlled with Lipitor 20 mg daily       Lab Results  Component Value Date   CHOL 150 05/25/2015   HDL 51.80 05/25/2015   LDLCALC 65 05/25/2015   TRIG 165.0* 05/25/2015   CHOLHDL 3 05/25/2015                  The blood pressure has been high for several years treated by PCP, is on lisinopril 20 mg           She has a long history of Numbness, pain, tingling or burning in feet  Symptoms relieved with  gabapentin to 600 mg 3 times a day.  Taking tramadol as needed    Foot exam in 10/15 showed:  Vibration sense is markedly reduced in toes. Ankle jerks are 1+ bilaterally.          Diabetic foot exam:  Absent in the toes on the right, decreased on the left great toe and absent on the smaller left toes And plantar surfaces   Physical Examination:  BP 122/72 mmHg  Pulse 84  Temp(Src) 98.2 F (36.8 C)  Resp 16  Ht 5' 6.5" (1.689 m)  Wt 231 lb 9.6 oz (105.053 kg)  BMI 36.83 kg/m2  SpO2 96%    no ankle edema  ASSESSMENT:  Diabetes type 2, uncontrolled with obesity She is on a regimen of Victoza, metformin and low-dose Amaryl  Her blood sugars recently higher than the last time and recent readings averaging nearly 200 She also has fasting hyperglycemia See history of present illness for detailed discussion of his current management,  blood sugar patterns and problems identified  She is also having difficulty losing weight despite exercising regularly and taking Victoza She only has good blood sugars at times when she is exercising  Sporadically will have high readings after meals also indicating inconsistent compliance with diet   She is a good candidate for medication like Invokana Discussed action of SGLT 2 drugs on lowering glucose by decreasing kidney absorption of glucose, benefits of weight loss and lower blood pressure, possible side effects including candidiasis and dosage regimen    PLAN:   To try Invokana 100 mg in the mornings She was changed her Amaryl to 1 mg at suppertime and may need to reduce this if blood sugars are significantly lower Reduce lisinopril to half tablet Her monitor readings were clear day and date and time resected Continue taking additional 500  mg metformin ER at dinnertime More consistent monitoring at various times, discussed blood sugar targets  Patient Instructions  Check blood sugars on waking up 2-3  times a week Also check blood sugars about 2 hours after a meal and do this after different meals by rotation  Recommended blood sugar levels on waking up is 90-130 and about 2 hours after meal is 130-160  Please bring your blood sugar monitor to each visit, thank you  Take Glimperide before supper only; if sugar gets <100 then stop this  Cut Lisinopril in 1/2     Counseling time on subjects discussed above is over 50% of today's 25 minute visit   Arlo Buffone 05/25/2015, 9:35 PM     Note: This office note was prepared with Dragon voice recognition system technology. Any transcriptional errors that result from this process are unintentional.

## 2015-05-25 NOTE — Patient Instructions (Addendum)
Check blood sugars on waking up 2-3  times a week Also check blood sugars about 2 hours after a meal and do this after different meals by rotation  Recommended blood sugar levels on waking up is 90-130 and about 2 hours after meal is 130-160  Please bring your blood sugar monitor to each visit, thank you  Take Glimperide before supper only; if sugar gets <100 then stop this  Cut Lisinopril in 1/2

## 2015-06-22 ENCOUNTER — Encounter: Payer: Self-pay | Admitting: Endocrinology

## 2015-06-22 ENCOUNTER — Other Ambulatory Visit: Payer: Self-pay | Admitting: *Deleted

## 2015-06-22 ENCOUNTER — Ambulatory Visit (INDEPENDENT_AMBULATORY_CARE_PROVIDER_SITE_OTHER): Payer: BLUE CROSS/BLUE SHIELD | Admitting: Endocrinology

## 2015-06-22 VITALS — BP 118/74 | HR 86 | Temp 98.2°F | Resp 14 | Ht 66.5 in | Wt 225.8 lb

## 2015-06-22 DIAGNOSIS — E1165 Type 2 diabetes mellitus with hyperglycemia: Secondary | ICD-10-CM | POA: Diagnosis not present

## 2015-06-22 LAB — COMPREHENSIVE METABOLIC PANEL
ALK PHOS: 101 U/L (ref 39–117)
ALT: 21 U/L (ref 0–35)
AST: 15 U/L (ref 0–37)
Albumin: 4.2 g/dL (ref 3.5–5.2)
BUN: 17 mg/dL (ref 6–23)
CO2: 26 mEq/L (ref 19–32)
CREATININE: 0.97 mg/dL (ref 0.40–1.20)
Calcium: 9.5 mg/dL (ref 8.4–10.5)
Chloride: 104 mEq/L (ref 96–112)
GFR: 62.6 mL/min (ref >60.00)
Glucose, Bld: 145 mg/dL — ABNORMAL HIGH (ref 70–99)
Potassium: 4.2 mEq/L (ref 3.5–5.1)
Sodium: 138 mEq/L (ref 135–145)
Total Bilirubin: 0.4 mg/dL (ref 0.2–1.2)
Total Protein: 7 g/dL (ref 6.0–8.3)

## 2015-06-22 MED ORDER — INSULIN PEN NEEDLE 32G X 4 MM MISC: Status: DC

## 2015-06-22 NOTE — Patient Instructions (Signed)
Check blood sugars on waking up   times a week Also check blood sugars about 2 hours after a meal and do this after different meals by rotation  Recommended blood sugar levels on waking up is 90-130 and about 2 hours after meal is 130-160  Please bring your blood sugar monitor to each visit, thank you   

## 2015-06-22 NOTE — Progress Notes (Signed)
Patient ID: JAKEVIA DYAL, female   DOB: 06-07-1956, 59 y.o.   MRN: [id number]           Reason for Appointment: Follow-up for Type 2 Diabetes  Referring physician: Kathryne Eriksson  History of Present Illness:          Diagnosis: Type 2 diabetes mellitus, date of diagnosis:  2010       Past history:  She was having symptoms of neuropathy at onset At that time she was started on metformin alone, 500 mg twice a day and this was continued unchanged until recently Apparently she was also given Onglyza about 4 years ago but details of her control at that time is not available Her A1c levels have been mild increase over the last year but she thinks her blood sugars had been generally good with only some readings up to 200. A1c was 7.1 in 1/15 and she was continued on metformin and Onglyza When her A1c was 7.9 in August she was told to double her metformin to 2 g daily She  had blood sugars of 200 or more prior to her initial consultation  Recent history:   Non-insulin hypoglycemic drugs the patient is taking are:  metformin ER 2 g daily, Invokana 100 mg daily Amaryl 1mg  , Victoza 1.8 mg      Her Trulicity was changed to Victoza because of diarrhea and abdominal pain and has been able to take Victoza  1.8 hs without side effects  She was also started on Amaryl 1 mg daily along with metformin She is now able to take metformin 4 tablets a day now However because of persistently high readings and A1c of 7.6 she was started on Invokana 100 mg daily in 11/16 She has had no side effects with this and her blood sugars are improved  Current blood sugar patterns and problems identified:  Her blood sugars have been checked somewhat erratically and mostly before her first meal  Although she has some fluctuation in her blood sugars they are generally better  Fasting blood sugars are fairly good recently some: She also things blood sugars are better when she exercises  She has only a few readings  after evening meals which are not consistently high   Stopping her Amaryl in the morning she is not having any low normal blood sugars in the afternoon  She has finally lost some weight with adding the Invokana  Median blood sugar is 141, previously 180   Side effects from medications have been: Diarrhea, abdominal pain with Trulicity Compliance with the medical regimen: Good  Glucose monitoring:  done once or twice a day         Glucometer:  One Touch       Blood Glucose readings from home monitor:    Mean values apply above for all meters except median for One Touch  PRE-MEAL Fasting Lunch  early afternoon  Bedtime Overall  Glucose range:  106-187    108-189   158-245    Mean/median:      141     Self-care: The diet that the patient has been following is: tries to limit carbs.     Meals: 3 meals per day. Breakfast is yogurt/ cereal at 9 am; , lunch is a sandwich, and dinner at 5 pm will have baked chicken, starch and vegetables. Has snacks with yogurt, cottage cheese, fruit, nuts and  rice cakes            Exercise: walks  up to 20 minutes, 3-6 days a week, on treadmill mostly 1 pm      Dietician visit, most recent: 9/15              Weight history:  Wt Readings from Last 3 Encounters:  06/22/15 225 lb 12.8 oz (102.422 kg)  05/25/15 231 lb 9.6 oz (105.053 kg)  02/23/15 227 lb 9.6 oz (103.239 kg)    Glycemic control:     Lab Results  Component Value Date   HGBA1C 7.6 05/25/2015   HGBA1C 6.9* 02/23/2015   HGBA1C 7.0* 11/22/2014   Lab Results  Component Value Date   MICROALBUR <0.7 05/25/2015   LDLCALC 65 05/25/2015   CREATININE 1.14 05/25/2015         Medication List       This list is accurate as of: 06/22/15  4:52 PM.  Always use your most recent med list.               acetaminophen 650 MG CR tablet  Commonly known as:  TYLENOL  Take 1,300 mg by mouth every 8 (eight) hours as needed for pain.     aspirin 81 MG EC tablet  Take 1 tablet (81 mg total)  by mouth daily.     atorvastatin 20 MG tablet  Commonly known as:  LIPITOR  Take 1 tablet (20 mg total) by mouth daily.     canagliflozin 100 MG Tabs tablet  Commonly known as:  INVOKANA  1 tablet before breakfast     Diclofenac Sodium 3 % Gel     gabapentin 600 MG tablet  Commonly known as:  NEURONTIN  TAKE 1 TABLET 3 TIMES PER DAY AS NEEDED     glimepiride 1 MG tablet  Commonly known as:  AMARYL  Take 1 tablet (1 mg total) by mouth daily with breakfast.     Insulin Pen Needle 32G X 4 MM Misc  Use one per day to inject victoza     Liraglutide 18 MG/3ML Sopn  Commonly known as:  VICTOZA  INJECT 1.8 MG SUBCUTANEOUSLY  ONCE DAILY     lisinopril 20 MG tablet  Commonly known as:  PRINIVIL,ZESTRIL  Take 20 mg by mouth daily.     metFORMIN 500 MG 24 hr tablet  Commonly known as:  GLUCOPHAGE-XR  Take 4 tablets (2,000 mg total) by mouth every evening.     nitroGLYCERIN 0.4 MG SL tablet  Commonly known as:  NITROSTAT  Place 1 tablet (0.4 mg total) under the tongue every 5 (five) minutes as needed for chest pain.     ONE TOUCH ULTRA TEST test strip  Generic drug:  glucose blood  USE AS INSTRUCTED TO CHECK BLOOD SUGAR 2 TIMES PER DAY DX CODE 123XX123     ONETOUCH DELICA LANCETS FINE Misc  Use to check blood sugar 2 times per day dx code E11.49     traMADol 50 MG tablet  Commonly known as:  ULTRAM  Take 50 mg by mouth every 6 (six) hours as needed.     Venlafaxine HCl 225 MG Tb24  Take 225 mg by mouth daily.        Allergies:  Allergies  Allergen Reactions  . Other     seasonal  . Relafen [Nabumetone]     Trouble breathing, break out  . Restasis [Cyclosporine]     Eyes burn and itch    Past Medical History  Diagnosis Date  . Diabetes mellitus without complication ([place])   .  Hypertension   . Obesity (BMI 30-39.9)   . Depression 05/10/2013  . Osteopenia   . 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 Diverticulosis   . Irritable bowel   .  Neuropathy associated with endocrine disorder ([place])   . Neuropathy ([place])   . Colon polyps     Past Surgical History  Procedure Laterality Date  . Arm surgery Right   . Plantar faci      Family History  Problem Relation Age of Onset  . Diabetes type II Mother   . Stroke Mother   . Hypertension Mother   . Diabetes Mother   . Heart disease Father   . Hypertension Father   . Diabetes Father   . Heart disease Brother   . Hypertension Brother   . Diabetes Brother   . Kidney disease Brother   . Leukemia Other     Social History:  reports that she has never smoked. She has never used smokeless tobacco. She reports that she does not drink alcohol or use illicit drugs.    Review of Systems   Most recent eye exam was 11/15       Lipids: These have been controlled with Lipitor 20 mg daily       Lab Results  Component Value Date   CHOL 150 05/25/2015   HDL 51.80 05/25/2015   LDLCALC 65 05/25/2015   TRIG 165.0* 05/25/2015   CHOLHDL 3 05/25/2015                  The blood pressure has been high for several years treated by PCP, is on lisinopril 10 mg, this was reduced when she started [place] BP [id number] diastolic           She has a long history of Numbness, pain, tingling or burning in feet  Symptoms relieved with  gabapentin to 600 mg 3 times a day.  Taking tramadol as needed    Foot exam in 10/15 showed:  Vibration sense is markedly reduced in toes. Ankle jerks are 1+ bilaterally.          Diabetic foot exam:  Absent in the toes on the right, decreased on the left great toe and absent on the smaller left toes And plantar surfaces   Physical Examination:  BP 118/74 mmHg  Pulse 86  Temp(Src) 98.2 F (36.8 C)  Resp 14  Ht 5' 6.5" (1.689 m)  Wt 225 lb 12.8 oz (102.422 kg)  BMI 35.90 kg/m2  SpO2 94%    no ankle edema  ASSESSMENT:  Diabetes type 2, uncontrolled with obesity She is on a regimen of Victoza, Invokana,  metformin and low-dose Amaryl   Her  blood sugars recently have improved significantly as shown by her home readings She still has some  fasting hyperglycemia but this is relatively better and generally controlled with using Amaryl 1 mg in the evening She also does better with her control when she is exercising She has lost a little weight with starting Invokana and is tolerating this well  Sporadically will have high readings after meals when she is not consistent with her diet or carbohydrate intake  PLAN:   To continue same regimen More readings after meals A1c in 3 months  Patient Instructions  Check blood sugars on waking up   times a week Also check blood sugars about 2 hours after a meal and do this after different meals by rotation  Recommended blood sugar  levels on waking up is 90-130 and about 2 hours after meal is 130-160  Please bring your blood sugar monitor to each visit, thank you          [facility] 06/22/2015, 4:52 PM     Note: This office note was prepared with Dragon voice recognition system technology. Any transcriptional errors that result from this process are unintentional.

## 2015-07-13 ENCOUNTER — Other Ambulatory Visit: Payer: 59 | Admitting: Obstetrics & Gynecology

## 2015-07-13 ENCOUNTER — Other Ambulatory Visit (HOSPITAL_COMMUNITY)
Admission: RE | Admit: 2015-07-13 | Discharge: 2015-07-13 | Disposition: A | Discharge status: Home / Self Care | Payer: BLUE CROSS/BLUE SHIELD | Source: Ambulatory Visit | Attending: Obstetrics and Gynecology | Admitting: Obstetrics and Gynecology

## 2015-07-13 ENCOUNTER — Encounter: Payer: Self-pay | Admitting: Obstetrics and Gynecology

## 2015-07-13 ENCOUNTER — Ambulatory Visit (INDEPENDENT_AMBULATORY_CARE_PROVIDER_SITE_OTHER): Payer: BLUE CROSS/BLUE SHIELD | Admitting: Obstetrics and Gynecology

## 2015-07-13 VITALS — BP 138/78 | HR 60 | Ht 66.0 in | Wt 223.4 lb

## 2015-07-13 DIAGNOSIS — Z1151 Encounter for screening for human papillomavirus (HPV): Secondary | ICD-10-CM | POA: Diagnosis present

## 2015-07-13 DIAGNOSIS — Z0000 Encounter for general adult medical examination without abnormal findings: Secondary | ICD-10-CM | POA: Insufficient documentation

## 2015-07-13 DIAGNOSIS — D126 Benign neoplasm of colon, unspecified: Secondary | ICD-10-CM | POA: Insufficient documentation

## 2015-07-13 DIAGNOSIS — Z01419 Encounter for gynecological examination (general) (routine) without abnormal findings: Secondary | ICD-10-CM | POA: Insufficient documentation

## 2015-07-13 DIAGNOSIS — Z1211 Encounter for screening for malignant neoplasm of colon: Secondary | ICD-10-CM

## 2015-07-13 DIAGNOSIS — Z01411 Encounter for gynecological examination (general) (routine) with abnormal findings: Secondary | ICD-10-CM | POA: Insufficient documentation

## 2015-07-13 DIAGNOSIS — Z139 Encounter for screening, unspecified: Secondary | ICD-10-CM

## 2015-07-13 LAB — HEMOCCULT GUIAC POC 1CARD (OFFICE): Fecal Occult Blood, POC: NEGATIVE

## 2015-07-13 NOTE — Progress Notes (Signed)
Patient ID: [name], female   DOB: 1956/01/22, 59 y.o.   MRN: [id number]  Assessment:  Annual Gyn Exam DM 2 Obesity , improved Hx colon polyps precancerous.;   Plan:  1. pap smear done, next pap due 2019 2. return  q 3 for pap, rectal and breast q38yr here or at primary care 3    Annual mammogram advised Subjective:  [name] is a 59 y.o. female No obstetric history on file. who presents for annual exam. No LMP recorded. Patient is postmenopausal. The patient has complaints today of no acute probs. Hx colon polyps PRECANCEROUS, q 28yr colonoscopy  The following portions of the patient's history were reviewed and updated as appropriate: allergies, current medications, past family history, past medical history, past social history, past surgical history and problem list. Past Medical History  Diagnosis Date  . Diabetes mellitus without complication ([place])   . Hypertension   . Obesity (BMI 30-39.9)   . Depression 05/10/2013  . Osteopenia   . 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 Diverticulosis   . Irritable bowel   . Neuropathy associated with endocrine disorder ([place])   . Neuropathy ([place])   . Colon polyps     Past Surgical History  Procedure Laterality Date  . Arm surgery Right   . Plantar faci       Current outpatient prescriptions:  .  acetaminophen (TYLENOL) 650 MG CR tablet, Take 1,300 mg by mouth every 8 (eight) hours as needed for pain., Disp: , Rfl:  .  aspirin EC 81 MG EC tablet, Take 1 tablet (81 mg total) by mouth daily., Disp: , Rfl:  .  atorvastatin (LIPITOR) 20 MG tablet, Take 1 tablet (20 mg total) by mouth daily., Disp: 30 tablet, Rfl: 0 .  canagliflozin (INVOKANA) 100 MG TABS tablet, 1 tablet before breakfast, Disp: 30 tablet, Rfl: 3 .  Diclofenac Sodium 3 % GEL, as needed. , Disp: , Rfl:  .  gabapentin (NEURONTIN) 600 MG tablet, TAKE 1 TABLET 3 TIMES PER DAY AS NEEDED, Disp: 90 tablet, Rfl: 1 .  glimepiride (AMARYL) 1 MG  tablet, Take 1 tablet (1 mg total) by mouth daily with breakfast., Disp: 30 tablet, Rfl: 3 .  Insulin Pen Needle 32G X 4 MM MISC, Use one per day to inject victoza, Disp: 30 each, Rfl: 5 .  Liraglutide (VICTOZA) 18 MG/3ML SOPN, INJECT 1.8 MG SUBCUTANEOUSLY  ONCE DAILY, Disp: 9 mL, Rfl: 3 .  lisinopril (PRINIVIL,ZESTRIL) 20 MG tablet, Take 20 mg by mouth daily., Disp: , Rfl:  .  metFORMIN (GLUCOPHAGE-XR) 500 MG 24 hr tablet, Take 4 tablets (2,000 mg total) by mouth every evening., Disp: 120 tablet, Rfl: 3 .  nitroGLYCERIN (NITROSTAT) 0.4 MG SL tablet, Place 1 tablet (0.4 mg total) under the tongue every 5 (five) minutes as needed for chest pain., Disp: 30 tablet, Rfl: 12 .  ONE TOUCH ULTRA TEST test strip, USE AS INSTRUCTED TO CHECK BLOOD SUGAR 2 TIMES PER DAY DX CODE E11.49, Disp: 100 each, Rfl: 2 .  ONETOUCH DELICA LANCETS FINE MISC, Use to check blood sugar 2 times per day dx code E11.49, Disp: 100 each, Rfl: 3 .  Venlafaxine HCl 225 MG TB24, Take 225 mg by mouth daily., Disp: , Rfl:   Review of Systems Constitutional: negative, 100 pound wt loss in 29yr Gastrointestinal: negative Genitourinary: had abnormal pap 3 yr ago with neg 69mo f/u  Objective:  BP 138/78 mmHg  Pulse 60  Ht 5\' 6"  (1.676 m)  Wt 223 lb 6.4 oz (101.334 kg)  BMI 36.08 kg/m2   BMI: Body mass index is 36.08 kg/(m^2).  General Appearance: Alert, appropriate appearance for age. No acute distress HEENT: Grossly normal Neck / Thyroid:  Cardiovascular: RRR; normal S1, S2, no murmur Lungs: CTA bilaterally Back: No CVAT Breast Exam: No dimpling, nipple retraction or discharge. No masses or nodes., Normal breast tissue bilaterally and No masses or nodes.No dimpling, nipple retraction or discharge. Gastrointestinal: Soft, non-tender, no masses or organomegaly Pelvic Exam: Vulva and vagina appear normal. Bimanual exam reveals normal uterus and adnexa. early cystocele Rectovaginal: normal rectal, no masses and guaiac negative  stool obtained Lymphatic Exam: Non-palpable nodes in neck, clavicular, axillary, or inguinal regions Skin: no rash or abnormalities Neurologic: Normal gait and speech, no tremor  Psychiatric: Alert and oriented, appropriate affect.  Urinalysis:Not done  Mallory Shirk. MD Pgr [phone] 3:54 PM

## 2015-07-15 LAB — CYTOLOGY - PAP

## 2015-08-10 ENCOUNTER — Telehealth: Payer: Self-pay | Admitting: Endocrinology

## 2015-08-10 ENCOUNTER — Telehealth: Payer: Self-pay | Admitting: *Deleted

## 2015-08-10 ENCOUNTER — Other Ambulatory Visit: Payer: Self-pay | Admitting: *Deleted

## 2015-08-10 MED ORDER — FLUCONAZOLE 150 MG PO TABS: 150.0000 mg | ORAL_TABLET | Freq: Once | ORAL | Status: DC

## 2015-08-10 MED ORDER — GLUCOSE BLOOD VI STRP: ORAL_STRIP | Status: DC

## 2015-08-10 MED ORDER — BAYER CONTOUR NEXT MONITOR W/DEVICE KIT: PACK | Status: DC

## 2015-08-10 MED ORDER — BAYER MICROLET LANCETS MISC: Status: DC

## 2015-08-10 NOTE — Telephone Encounter (Signed)
Noted, rx sent.

## 2015-08-10 NOTE — Telephone Encounter (Signed)
Diflucan 150 mg, 1 tablet. She should ensure that she is drinking adequate water

## 2015-08-10 NOTE — Telephone Encounter (Signed)
Patient called stating that her insurance is only allowing one meter   Meter: Contour Next  Test strips   Invokana: has a yeast infection   Pharmacy: [facility]  Thank you

## 2015-08-10 NOTE — Telephone Encounter (Signed)
Patient called stating the Invokana has caused a yeast infection,  Please advise.

## 2015-08-11 ENCOUNTER — Other Ambulatory Visit: Payer: Self-pay | Admitting: *Deleted

## 2015-08-11 ENCOUNTER — Telehealth: Payer: Self-pay | Admitting: Endocrinology

## 2015-08-11 MED ORDER — BAYER MICROLET LANCETS MISC: Status: DC

## 2015-08-11 MED ORDER — BAYER CONTOUR NEXT MONITOR W/DEVICE KIT: PACK | Status: DC

## 2015-08-11 MED ORDER — GLUCOSE BLOOD VI STRP: ORAL_STRIP | Status: DC

## 2015-08-11 NOTE — Telephone Encounter (Signed)
They have been resent to [facility]

## 2015-08-11 NOTE — Telephone Encounter (Signed)
Please stated that [facility] closing, please send Meter countor next portfolio and test strips to [facility] in [place]

## 2015-09-15 ENCOUNTER — Other Ambulatory Visit: Payer: Self-pay | Admitting: Endocrinology

## 2015-09-16 ENCOUNTER — Other Ambulatory Visit: Payer: Self-pay | Admitting: Endocrinology

## 2015-09-20 ENCOUNTER — Ambulatory Visit: Payer: BLUE CROSS/BLUE SHIELD | Admitting: Endocrinology

## 2015-10-06 ENCOUNTER — Ambulatory Visit (INDEPENDENT_AMBULATORY_CARE_PROVIDER_SITE_OTHER): Payer: BLUE CROSS/BLUE SHIELD | Admitting: Endocrinology

## 2015-10-06 VITALS — BP 110/74 | HR 77 | Temp 98.3°F | Resp 16 | Ht 66.0 in | Wt 218.2 lb

## 2015-10-06 DIAGNOSIS — E1142 Type 2 diabetes mellitus with diabetic polyneuropathy: Secondary | ICD-10-CM

## 2015-10-06 LAB — POCT GLYCOSYLATED HEMOGLOBIN (HGB A1C): Hemoglobin A1C: 7

## 2015-10-06 NOTE — Patient Instructions (Signed)
1/2 gabapentin at lunch  Check blood sugars on waking up 2-3 times a week Also check blood sugars about 2 hours after a meal and do this after different meals by rotation  Recommended blood sugar levels on waking up is 90-130 and about 2 hours after meal is 130-160  Please bring your blood sugar monitor to each visit, thank you

## 2015-10-06 NOTE — Progress Notes (Signed)
Patient ID: [name], female   DOB: 10-20-1955, 60 y.o.   MRN: [id number]           Reason for Appointment: Follow-up for Type 2 Diabetes  Referring physician: Kathryne Eriksson  History of Present Illness:          Diagnosis: Type 2 diabetes mellitus, date of diagnosis:  2010       Past history:  She was having symptoms of neuropathy at onset At that time she was started on metformin alone, 500 mg twice a day and this was continued unchanged until recently Apparently she was also given Onglyza about 4 years ago but details of her control at that time is not available Her A1c levels have been mild increase over the last year but she thinks her blood sugars had been generally good with only some readings up to 200. A1c was 7.1 in 1/15 and she was continued on metformin and Onglyza When her A1c was 7.9 in August she was told to double her metformin to 2 g daily She  had blood sugars of 200 or more prior to her initial consultation  Recent history:   Non-insulin hypoglycemic drugs the patient is taking are:  metformin ER 2 g daily, Invokana 100 mg daily Amaryl 1m acs , Victoza 1.8 mg      Her Trulicity was changed to Victoza because of diarrhea and abdominal pain and has been able to take Victoza  1.8 hs without side effects  She was also started on Amaryl 1 mg daily along with metformin 2000 mg a day which she is tolerating also  When A1c was 7.6 she was started on Invokana 100 mg daily in 11/16 She has had no side effects with this either  Her A1c is improved back to 7% now  Current blood sugar patterns and problems identified:  Her blood sugars have been checked relatively infrequently  She has minimal increase in readings after meals but only occasionally around 180+ after evening meal based on her meal size  She has done better with her exercise regimen and has increased the duration and frequency of her walking  She has lost 5 pounds  No hypoglycemia with low dose Amaryl  taken at suppertime  Median blood sugar is 143, previously 141   Side effects from medications have been: Diarrhea, abdominal pain with Trulicity Compliance with the medical regimen: Good  Glucose monitoring:  done once or twice a day         Glucometer:  One Touch       Blood Glucose readings from home monitor:    Mean values apply above for all meters except median for One Touch  PRE-MEAL Fasting Lunch Dinner Bedtime Overall  Glucose range: 106, 121    150, 155    Mean/median:     143   POST-MEAL PC Breakfast PC Lunch PC Dinner  Glucose range: 107-161  136-150  116-192   Mean/median:         Self-care: The diet that the patient has been following is: tries to limit carbohydrates .     Meals: 3 meals per day. Breakfast is yogurt/ cereal at 9 am; , lunch is a sandwich, and dinner at 5 pm will have baked chicken, starch and vegetables. Has snacks with yogurt, cottage cheese, fruit, nuts and  rice cakes            Exercise: walks up to 40 minutes, 3-6 days a week, on treadmill mostly around  1 pm      Dietician visit, most recent: 9/15              Weight history:  Wt Readings from Last 3 Encounters:  10/06/15 218 lb 3.2 oz (98.975 kg)  07/13/15 223 lb 6.4 oz (101.334 kg)  06/22/15 225 lb 12.8 oz (102.422 kg)    Glycemic control:     Lab Results  Component Value Date   HGBA1C 7.0 10/06/2015   HGBA1C 7.6 05/25/2015   HGBA1C 6.9* 02/23/2015   Lab Results  Component Value Date   MICROALBUR <0.7 05/25/2015   LDLCALC 65 05/25/2015   CREATININE 0.97 06/22/2015         Medication List       This list is accurate as of: 10/06/15 11:59 PM.  Always use your most recent med list.               acetaminophen 650 MG CR tablet  Commonly known as:  TYLENOL  Take 1,300 mg by mouth every 8 (eight) hours as needed for pain.     aspirin 81 MG EC tablet  Take 1 tablet (81 mg total) by mouth daily.     atorvastatin 20 MG tablet  Commonly known as:  LIPITOR  Take 1  tablet (20 mg total) by mouth daily.     BAYER CONTOUR NEXT MONITOR w/Device Kit  Use to check blood sugar 2 times per day dx code E11.49     BAYER MICROLET LANCETS lancets  Use as instructed to check blood sugar 2 times per day dx code E11.49     Diclofenac Sodium 3 % Gel  as needed.     fluconazole 150 MG tablet  Commonly known as:  DIFLUCAN  Take 1 tablet (150 mg total) by mouth once.     gabapentin 600 MG tablet  Commonly known as:  NEURONTIN  TAKE 1 TABLET 3 TIMES PER DAY AS NEEDED     glimepiride 1 MG tablet  Commonly known as:  AMARYL  Take 1 tablet (1 mg total) by mouth daily with breakfast.     glucose blood test strip  Commonly known as:  BAYER CONTOUR NEXT TEST  Use as instructed to check blood sugar 2 times per day dx code E11.49     Insulin Pen Needle 32G X 4 MM Misc  Use one per day to inject victoza     INVOKANA 100 MG Tabs tablet  Generic drug:  canagliflozin  TAKE 1 TABLET BEFORE BREAKFAST     lisinopril 20 MG tablet  Commonly known as:  PRINIVIL,ZESTRIL  Take 20 mg by mouth daily.     metFORMIN 500 MG 24 hr tablet  Commonly known as:  GLUCOPHAGE-XR  TAKE 4 TABLETS (2,000 MG TOTAL) BY MOUTH EVERY EVENING.     nitroGLYCERIN 0.4 MG SL tablet  Commonly known as:  NITROSTAT  Place 1 tablet (0.4 mg total) under the tongue every 5 (five) minutes as needed for chest pain.     Venlafaxine HCl 225 MG Tb24  Take 225 mg by mouth daily.     VICTOZA 18 MG/3ML Sopn  Generic drug:  Liraglutide  INJECT 1.8 MG SUBCUTANEOUSLY ONCE DAILY        Allergies:  Allergies  Allergen Reactions  . Other     seasonal  . Relafen [Nabumetone]     Trouble breathing, break out  . Restasis [Cyclosporine]     Eyes burn and itch    Past Medical History  Diagnosis Date  . Diabetes mellitus without complication ([place])   . Hypertension   . Obesity (BMI 30-39.9)   . Depression 05/10/2013  . Osteopenia   . 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  Diverticulosis   . Irritable bowel   . Neuropathy associated with endocrine disorder ([place])   . Neuropathy ([place])   . Colon polyps     Past Surgical History  Procedure Laterality Date  . Arm surgery Right   . Plantar faci      Family History  Problem Relation Age of Onset  . Diabetes type II Mother   . Stroke Mother   . Hypertension Mother   . Diabetes Mother   . Heart disease Father   . Hypertension Father   . Diabetes Father   . Heart attack Father   . Heart disease Brother   . Hypertension Brother   . Diabetes Brother   . Kidney disease Brother   . Leukemia Other     brother's grandson  . Diabetes Sister   . Asthma Maternal Grandmother   . Hypertension Maternal Grandmother   . Heart attack Maternal Grandmother   . Heart attack Maternal Grandfather   . Heart attack Paternal Grandmother   . Diabetes Paternal Grandfather   . Heart attack Paternal Grandfather   . Diabetes Brother   . Heart attack Brother   . Diabetes Brother   . Diabetes Brother   . Other Brother     Bright's Disease    Social History:  reports that she has never smoked. She has never used smokeless tobacco. She reports that she does not drink alcohol or use illicit drugs.    Review of Systems   Most recent eye exam was 11/15       Lipids: These have been controlled with Lipitor 20 mg daily       Lab Results  Component Value Date   CHOL 150 05/25/2015   HDL 51.80 05/25/2015   LDLCALC 65 05/25/2015   TRIG 165.0* 05/25/2015   CHOLHDL 3 05/25/2015                  The blood pressure has been high for several years treated by PCP, is on lisinopril 10 mg,Using half of a 10 mg Home BP 094/70-96 diastolic           She has a long history of Numbness, pain, tingling or burning in feet  Symptoms relieved with  gabapentin to 600 mg 3 times a day and she does not think she can skip a dose.  Taking tramadol as needed but less now    Foot exam in 3/17 showed only decreased sensation on the right  first and left fifth toes   Physical Examination:  BP 110/74 mmHg  Pulse 77  Temp(Src) 98.3 F (36.8 C)  Resp 16  Ht 5' 6"  (1.676 m)  Wt 218 lb 3.2 oz (98.975 kg)  BMI 35.24 kg/m2  SpO2 96%    no ankle edema  Diabetic Foot Exam - Simple   Simple Foot Form  Diabetic Foot exam was performed with the following findings:  Yes 10/06/2015  4:48 PM  Visual Inspection  No deformities, no ulcerations, no other skin breakdown bilaterally:  Yes  Sensation Testing  Intact to touch and monofilament testing bilaterally:  Yes  See comments:  Yes  Pulse Check  Posterior Tibialis and Dorsalis pulse intact bilaterally:  Yes  Comments  Monofilament sensation decreased in  the left fifth toe and first toe on the right       ASSESSMENT:  Diabetes type 2, uncontrolled with obesity She is on a regimen of Victoza, Invokana 100 mg,  metformin and low-dose Amaryl   Her blood sugars recently have improved and A1c is now 7% She is starting to lose weight also Blood sugars are fairly good at home with mild increase after meals at times She is doing well with increasing her exercise regimen and generally watching portions  NEUROPATHY: Symptoms of well controlled, she can try reducing at least her lunchtime gabapentin to half tablet  PLAN:   To continue same regimen More readings after meals A1c in 3 months  Patient Instructions  1/2 gabapentin at lunch  Check blood sugars on waking up 2-3 times a week Also check blood sugars about 2 hours after a meal and do this after different meals by rotation  Recommended blood sugar levels on waking up is 90-130 and about 2 hours after meal is 130-160  Please bring your blood sugar monitor to each visit, thank you          [facility] 10/07/2015, 12:34 PM     Note: This office note was prepared with Dragon voice recognition system technology. Any transcriptional errors that result from this process are unintentional.

## 2015-10-27 ENCOUNTER — Other Ambulatory Visit: Payer: Self-pay | Admitting: Endocrinology

## 2015-11-07 ENCOUNTER — Other Ambulatory Visit: Payer: Self-pay

## 2015-11-07 DIAGNOSIS — Z1231 Encounter for screening mammogram for malignant neoplasm of breast: Secondary | ICD-10-CM

## 2015-11-23 DIAGNOSIS — K589 Irritable bowel syndrome without diarrhea: Secondary | ICD-10-CM | POA: Insufficient documentation

## 2015-11-23 DIAGNOSIS — E119 Type 2 diabetes mellitus without complications: Secondary | ICD-10-CM | POA: Insufficient documentation

## 2015-11-23 DIAGNOSIS — G43009 Migraine without aura, not intractable, without status migrainosus: Secondary | ICD-10-CM | POA: Insufficient documentation

## 2015-11-23 DIAGNOSIS — M1990 Unspecified osteoarthritis, unspecified site: Secondary | ICD-10-CM | POA: Insufficient documentation

## 2015-11-23 DIAGNOSIS — F419 Anxiety disorder, unspecified: Secondary | ICD-10-CM | POA: Insufficient documentation

## 2015-11-23 DIAGNOSIS — G2581 Restless legs syndrome: Secondary | ICD-10-CM | POA: Insufficient documentation

## 2015-11-23 DIAGNOSIS — E782 Mixed hyperlipidemia: Secondary | ICD-10-CM | POA: Insufficient documentation

## 2015-11-29 ENCOUNTER — Ambulatory Visit
Admission: RE | Admit: 2015-11-29 | Discharge: 2015-11-29 | Disposition: A | Discharge status: Home / Self Care | Payer: BLUE CROSS/BLUE SHIELD | Source: Ambulatory Visit

## 2015-11-29 DIAGNOSIS — Z1231 Encounter for screening mammogram for malignant neoplasm of breast: Secondary | ICD-10-CM

## 2016-01-06 ENCOUNTER — Ambulatory Visit (INDEPENDENT_AMBULATORY_CARE_PROVIDER_SITE_OTHER): Payer: BLUE CROSS/BLUE SHIELD | Admitting: Endocrinology

## 2016-01-06 ENCOUNTER — Encounter: Payer: Self-pay | Admitting: Endocrinology

## 2016-01-06 VITALS — BP 138/78 | HR 93 | Ht 66.0 in | Wt 219.0 lb

## 2016-01-06 DIAGNOSIS — E1142 Type 2 diabetes mellitus with diabetic polyneuropathy: Secondary | ICD-10-CM

## 2016-01-06 DIAGNOSIS — E785 Hyperlipidemia, unspecified: Secondary | ICD-10-CM

## 2016-01-06 LAB — POCT GLYCOSYLATED HEMOGLOBIN (HGB A1C): HEMOGLOBIN A1C: 6.9

## 2016-01-06 NOTE — Progress Notes (Signed)
Patient ID: [name], female   DOB: [birth year], 60 y.o.   MRN: [id number]           Reason for Appointment: Follow-up for Type 2 Diabetes  Referring physician: Kathryne Eriksson  History of Present Illness:          Diagnosis: Type 2 diabetes mellitus, date of diagnosis:  2010       Past history:  She was having symptoms of neuropathy at onset At that time she was started on metformin alone, 500 mg twice a day and this was continued unchanged until recently Apparently she was also given Onglyza about 4 years ago but details of her control at that time is not available Her A1c levels have been mild increase over the last year but she thinks her blood sugars had been generally good with only some readings up to 200. A1c was 7.1 in 1/15 and she was continued on metformin and Onglyza When her A1c was 7.9 in August she was told to double her metformin to 2 g daily She  had blood sugars of 200 or more prior to her initial consultation  Recent history:   Non-insulin hypoglycemic drugs the patient is taking are:  metformin ER 2 g daily, Invokana 100 mg daily Amaryl 84m acs , Victoza 1.8 mg      Her Trulicity was changed to Victoza because of diarrhea and abdominal pain and has been able to take Victoza  1.8 hs  She was also started on Amaryl 1 mg daily along with metformin 2000 mg a day which she is tolerating also When A1c was 7.6 she was started on Invokana 100 mg in 11/16  Her A1c is consistently good and now 6.9  Current blood sugar patterns and problems identified:  Her blood sugars have been checked about every other day  She has variable mealtimes and not clear which readings are before or after meal, her first readings have been usually around midday  She will periodically  have readings in the 160-180 range but on an average they are still fairly good   minimal increase in readings after meals but only occasionally around 180+ after evening meal based on her meal size  She  has done well with her exercise regimen and has done this almost every day at times  She has not lost any more weight recently   No hypoglycemia with low dose Amaryl taken at suppertime   Side effects from medications have been: Diarrhea, abdominal pain with Trulicity Compliance with the medical regimen: Good  Glucose monitoring:  done once or twice a day         Glucometer:  One Touch       Blood Glucose readings from home monitor:    Mean values apply above for all meters except median for One Touch  PRE-MEAL Fasting Lunch Dinner PCS  Overall  Glucose range: 119-178   129-179  19-163    Mean/median:     138    Self-care: The diet that the patient has been following is: tries to limit carbohydrates .     Meals: 3 meals per day. Breakfast is yogurt/ cereal at 9 am; , lunch is a sandwich, and dinner at 5 pm will have baked chicken, starch and vegetables. Has snacks with yogurt, cottage cheese, fruit, nuts and  rice cakes            Exercise: walks up to 40 minutes, 3-5 days a week, on treadmill mostly around  1 pm      Dietician visit, most recent: 9/15              Weight history:  Wt Readings from Last 3 Encounters:  01/06/16 219 lb (99.338 kg)  10/06/15 218 lb 3.2 oz (98.975 kg)  07/13/15 223 lb 6.4 oz (101.334 kg)    Glycemic control:     Lab Results  Component Value Date   HGBA1C 6.9 01/06/2016   HGBA1C 7.0 10/06/2015   HGBA1C 7.6 05/25/2015   Lab Results  Component Value Date   MICROALBUR <0.7 05/25/2015   LDLCALC 67 01/06/2016   CREATININE 0.85 01/06/2016         Medication List       This list is accurate as of: 01/06/16 11:59 PM.  Always use your most recent med list.               acetaminophen 650 MG CR tablet  Commonly known as:  TYLENOL  Take 1,300 mg by mouth every 8 (eight) hours as needed for pain.     aspirin 81 MG EC tablet  Take 1 tablet (81 mg total) by mouth daily.     atorvastatin 20 MG tablet  Commonly known as:  LIPITOR  Take 1  tablet (20 mg total) by mouth daily.     BAYER CONTOUR NEXT MONITOR w/Device Kit  Use to check blood sugar 2 times per day dx code E11.49     BAYER MICROLET LANCETS lancets  Use as instructed to check blood sugar 2 times per day dx code E11.49     Diclofenac Sodium 3 % Gel  as needed.     fluconazole 150 MG tablet  Commonly known as:  DIFLUCAN  Take 1 tablet (150 mg total) by mouth once.     gabapentin 600 MG tablet  Commonly known as:  NEURONTIN  TAKE 1 TABLET 3 TIMES PER DAY AS NEEDED     glimepiride 1 MG tablet  Commonly known as:  AMARYL  Take 1 tablet (1 mg total) by mouth daily with breakfast.     glucose blood test strip  Commonly known as:  BAYER CONTOUR NEXT TEST  Use as instructed to check blood sugar 2 times per day dx code E11.49     Insulin Pen Needle 32G X 4 MM Misc  Use one per day to inject victoza     INVOKANA 100 MG Tabs tablet  Generic drug:  canagliflozin  TAKE 1 TABLET BEFORE BREAKFAST     lisinopril 20 MG tablet  Commonly known as:  PRINIVIL,ZESTRIL  Take 20 mg by mouth daily.     metFORMIN 500 MG 24 hr tablet  Commonly known as:  GLUCOPHAGE-XR  TAKE 4 TABLETS (2,000 MG TOTAL) BY MOUTH EVERY EVENING.     nitroGLYCERIN 0.4 MG SL tablet  Commonly known as:  NITROSTAT  Place 1 tablet (0.4 mg total) under the tongue every 5 (five) minutes as needed for chest pain.     Venlafaxine HCl 225 MG Tb24  Take 225 mg by mouth daily.     VICTOZA 18 MG/3ML Sopn  Generic drug:  Liraglutide  INJECT 1.8 MG SUBCUTANEOUSLY ONCE DAILY        Allergies:  Allergies  Allergen Reactions  . Other     seasonal  . Relafen [Nabumetone]     Trouble breathing, break out  . Restasis [Cyclosporine]     Eyes burn and itch    Past Medical History  Diagnosis  Date  . Diabetes mellitus without complication ([place])   . Hypertension   . Obesity (BMI 30-39.9)   . Depression 05/10/2013  . Osteopenia   . 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  Diverticulosis   . Irritable bowel   . Neuropathy associated with endocrine disorder ([place])   . Neuropathy ([place])   . Colon polyps     Past Surgical History  Procedure Laterality Date  . Arm surgery Right   . Plantar faci      Family History  Problem Relation Age of Onset  . Diabetes type II Mother   . Stroke Mother   . Hypertension Mother   . Diabetes Mother   . Heart disease Father   . Hypertension Father   . Diabetes Father   . Heart attack Father   . Heart disease Brother   . Hypertension Brother   . Diabetes Brother   . Kidney disease Brother   . Leukemia Other     brother's grandson  . Diabetes Sister   . Asthma Maternal Grandmother   . Hypertension Maternal Grandmother   . Heart attack Maternal Grandmother   . Heart attack Maternal Grandfather   . Heart attack Paternal Grandmother   . Diabetes Paternal Grandfather   . Heart attack Paternal Grandfather   . Diabetes Brother   . Heart attack Brother   . Diabetes Brother   . Diabetes Brother   . Other Brother     Bright's Disease    Social History:  reports that she has never smoked. She has never used smokeless tobacco. She reports that she does not drink alcohol or use illicit drugs.    Review of Systems   Most recent eye exam was 11/15       Lipids: These have been controlled with Lipitor 20 mg daily       Lab Results  Component Value Date   CHOL 152 01/06/2016   HDL 68 01/06/2016   LDLCALC 67 01/06/2016   TRIG 86 01/06/2016   CHOLHDL 2.2 01/06/2016                  The blood pressure has been high for several years treated by PCP, is on lisinopril 89m Home BP 1481/85-63diastolic Previously was on lower doses initially with starting Invokana      She has a long history of Numbness, pain, tingling or burning in feet  Symptoms relieved with  gabapentin to 600 mg 3 times a day and she does not think she can skip a dose because of pain.   Taking tramadol as needed    Foot exam in 3/17 showed  only decreased sensation on the right first and left fifth toes   Physical Examination:  BP 138/78 mmHg  Pulse 93  Ht 5' 6"  (1.676 m)  Wt 219 lb (99.338 kg)  BMI 35.36 kg/m2  SpO2 95%   ASSESSMENT:  Diabetes type 2, uncontrolled with obesity She is on a regimen of Victoza, Invokana 100 mg,  metformin 2 g and low-dose Amaryl   Her blood sugars recently have been consistently well controlled and A1c is now below 7% Recently weight has leveled off Blood sugars are fairly good at home with mild increase after meals at times She is doing well with increasing her exercise regimen and generally watching portions  NEUROPATHY: Symptoms of well controlled, to continue gabapentin  PLAN:   To continue same regimen Check more readings after meals to  help identify glycemic patterns with various foods Follow-up in 4 months Check lipids today  There are no Patient Instructions on file for this visit.       Alika Saladin 01/07/2016, 12:50 PM     Note: This office note was prepared with Dragon voice recognition system technology. Any transcriptional errors that result from this process are unintentional.

## 2016-01-07 LAB — COMPREHENSIVE METABOLIC PANEL
A/G RATIO: 1.9 (ref 1.2–2.2)
ALBUMIN: 4.4 g/dL (ref 3.5–5.5)
ALT: 18 IU/L (ref 0–32)
AST: 13 IU/L (ref 0–40)
Alkaline Phosphatase: 110 IU/L (ref 39–117)
BUN / CREAT RATIO: 21 (ref 9–23)
BUN: 18 mg/dL (ref 6–24)
Bilirubin Total: 0.3 mg/dL (ref 0.0–1.2)
CALCIUM: 9.8 mg/dL (ref 8.7–10.2)
CO2: 21 mmol/L (ref 18–29)
Chloride: 97 mmol/L (ref 96–106)
Creatinine, Ser: 0.85 mg/dL (ref 0.57–1.00)
GFR, EST AFRICAN AMERICAN: 87 mL/min/{1.73_m2} (ref >59)
GFR, EST NON AFRICAN AMERICAN: 76 mL/min/{1.73_m2} (ref >59)
GLOBULIN, TOTAL: 2.3 g/dL (ref 1.5–4.5)
Glucose: 104 mg/dL — ABNORMAL HIGH (ref 65–99)
POTASSIUM: 4.2 mmol/L (ref 3.5–5.2)
SODIUM: 137 mmol/L (ref 134–144)
TOTAL PROTEIN: 6.7 g/dL (ref 6.0–8.5)

## 2016-01-07 LAB — MICROALBUMIN / CREATININE URINE RATIO
Creatinine, Urine: 31.4 mg/dL
Microalbumin, Urine: <3 ug/mL

## 2016-01-07 LAB — URINALYSIS, ROUTINE W REFLEX MICROSCOPIC
Bilirubin, UA: NEGATIVE
KETONES UA: NEGATIVE
Leukocytes, UA: NEGATIVE
NITRITE UA: NEGATIVE
Protein, UA: NEGATIVE
RBC UA: NEGATIVE
SPEC GRAV UA: 1.02 (ref 1.005–1.030)
UUROB: 0.2 mg/dL (ref 0.2–1.0)
pH, UA: 5 (ref 5.0–7.5)

## 2016-01-07 LAB — LIPID PANEL
CHOLESTEROL TOTAL: 152 mg/dL (ref 100–199)
Chol/HDL Ratio: 2.2 ratio units (ref 0.0–4.4)
HDL: 68 mg/dL (ref >39)
LDL Calculated: 67 mg/dL (ref 0–99)
TRIGLYCERIDES: 86 mg/dL (ref 0–149)
VLDL Cholesterol Cal: 17 mg/dL (ref 5–40)

## 2016-01-07 NOTE — Progress Notes (Signed)
Quick Note:  Please let patient know that the lab results are normal and no further action needed ______ 

## 2016-01-09 ENCOUNTER — Telehealth: Payer: Self-pay

## 2016-01-09 NOTE — Telephone Encounter (Signed)
Called patient with normal lab results. No questions or concerns.

## 2016-02-08 ENCOUNTER — Other Ambulatory Visit: Payer: Self-pay | Admitting: Endocrinology

## 2016-02-29 ENCOUNTER — Other Ambulatory Visit: Payer: Self-pay | Admitting: Endocrinology

## 2016-05-02 ENCOUNTER — Other Ambulatory Visit: Payer: Self-pay | Admitting: Endocrinology

## 2016-05-07 ENCOUNTER — Ambulatory Visit (INDEPENDENT_AMBULATORY_CARE_PROVIDER_SITE_OTHER): Payer: BLUE CROSS/BLUE SHIELD | Admitting: Endocrinology

## 2016-05-07 ENCOUNTER — Encounter: Payer: Self-pay | Admitting: Endocrinology

## 2016-05-07 VITALS — BP 122/76 | HR 73 | Temp 97.8°F | Resp 16 | Ht 66.0 in | Wt 220.8 lb

## 2016-05-07 DIAGNOSIS — E1165 Type 2 diabetes mellitus with hyperglycemia: Secondary | ICD-10-CM

## 2016-05-07 DIAGNOSIS — E1140 Type 2 diabetes mellitus with diabetic neuropathy, unspecified: Secondary | ICD-10-CM | POA: Diagnosis not present

## 2016-05-07 DIAGNOSIS — Z23 Encounter for immunization: Secondary | ICD-10-CM

## 2016-05-07 LAB — POCT GLYCOSYLATED HEMOGLOBIN (HGB A1C): Hemoglobin A1C: 7.5

## 2016-05-07 MED ORDER — CANAGLIFLOZIN 100 MG PO TABS: 300.0000 mg | ORAL_TABLET | Freq: Every day | ORAL | 3 refills | Status: DC

## 2016-05-07 NOTE — Patient Instructions (Addendum)
Check blood sugars on waking up  2x weekly  Also check blood sugars about 2 hours after a meal and do this after different meals by rotation  Recommended blood sugar levels on waking up is 90-130 and about 2 hours after meal is 130-160  Please bring your blood sugar monitor to each visit, thank you  Cut Lisinopril to 1/2 with 200 or 300mg  Invokana

## 2016-05-07 NOTE — Progress Notes (Signed)
Patient ID: [name], female   DOB: 01/21/1956, 60 y.o.   MRN: [id number]           Reason for Appointment: Follow-up for Type 2 Diabetes  Referring physician: Kathryne Eriksson  History of Present Illness:          Diagnosis: Type 2 diabetes mellitus, date of diagnosis:  2010       Past history:  She was having symptoms of neuropathy at onset At that time she was started on metformin alone, 500 mg twice a day and this was continued unchanged until recently Apparently she was also given Onglyza about 4 years ago but details of her control at that time is not available Her A1c levels have been mild increase over the last year but she thinks her blood sugars had been generally good with only some readings up to 200. A1c was 7.1 in 1/15 and she was continued on metformin and Onglyza When her A1c was 7.9 in August she was told to double her metformin to 2 g daily She  had blood sugars of 200 or more prior to her initial consultation  Recent history:   Non-insulin hypoglycemic drugs the patient is taking are:   metformin ER 2 g daily, Invokana 100 mg daily Amaryl 78m acs , Victoza 1.8 mg      Her Trulicity was changed to Victoza because of diarrhea and abdominal pain and has been able to take Victoza  1.8 hs  She was also started on Amaryl 1 mg subsequently When A1c was 7.6 she was started on Invokana 100 mg in 11/16  Her A1c is usually near target but relatively higher at 7.5 now, previously 6.9  MORE STRESS  Current blood sugar patterns and problems identified:  Her blood sugars have been checked mostly in the afternoons after lunch since she thinks she is too busy in the morning  She does have sporadic high readings over 200 but overall blood sugars are averaging 159, previously 134  She has variable mealtimes   Although she thinks she is generally trying to watch her diet she tends to have higher readings because of family stress  Also despite trying to exercise fairly  regularly she is not able to lose anymore weight  No hypoglycemia with low dose Amaryl taken at suppertime   Side effects from medications have been: Diarrhea, abdominal pain with Trulicity Compliance with the medical regimen: Good  Glucose monitoring:  done once or twice a day         Glucometer:  One Touch       Blood Glucose readings from home monitor:  296  Mean values apply above for all meters except median for One Touch  PRE-MEAL Fasting Lunch PCL  Bedtime Overall  Glucose range:  145, 207  113-244  195  113-244  Mean/median:   154   159    Self-care: The diet that the patient has been following is: tries to limit carbohydrates .     Meals: 3 meals per day. Breakfast is yogurt/ cereal at 9 am; , lunch is a sandwich, and dinner at 5 pm will have baked chicken, starch and vegetables. Has snacks with yogurt, cottage cheese, fruit, nuts and  rice cakes            Exercise: walks up to 30 minutes, 3-5 days a week, [place], most recent[place]  Weight history:  Wt Readings from Last 3 Encounters:  05/07/16 220 lb 12.8 oz (100.2 kg)  01/06/16 219 lb (99.3 kg)  10/06/15 218 lb 3.2 oz (99 kg)    Glycemic control:     Lab Results  Component Value Date   HGBA1C 7.5 05/07/2016   HGBA1C 6.9 01/06/2016   HGBA1C 7.0 10/06/2015   Lab Results  Component Value Date   MICROALBUR <0.7 05/25/2015   LDLCALC 67 01/06/2016   CREATININE 0.85 01/06/2016         Medication List       Accurate as of 05/07/16 11:59 PM. Always use your most recent med list.          acetaminophen 650 MG CR tablet Commonly known as:  TYLENOL Take 1,300 mg by mouth every 8 (eight) hours as needed for pain.   aspirin 81 MG EC tablet Take 1 tablet (81 mg total) by mouth daily.   atorvastatin 20 MG tablet Commonly known as:  LIPITOR Take 1 tablet (20 mg total) by mouth daily.   BAYER CONTOUR NEXT MONITOR w/Device Kit Use to check blood sugar 2 times per day dx code  E11.49   BAYER MICROLET LANCETS lancets Use as instructed to check blood sugar 2 times per day dx code E11.49   canagliflozin 100 MG Tabs tablet Commonly known as:  INVOKANA Take 3 tablets (300 mg total) by mouth daily before breakfast.   Diclofenac Sodium 3 % Gel as needed.   fluconazole 150 MG tablet Commonly known as:  DIFLUCAN Take 1 tablet (150 mg total) by mouth once.   gabapentin 600 MG tablet Commonly known as:  NEURONTIN TAKE 1 TABLET 3 TIMES PER DAY AS NEEDED   glimepiride 1 MG tablet Commonly known as:  AMARYL TAKE 1 TABLET (1 MG TOTAL) BY MOUTH DAILY WITH BREAKFAST.   glucose blood test strip Commonly known as:  BAYER CONTOUR NEXT TEST Use as instructed to check blood sugar 2 times per day dx code E11.49   Insulin Pen Needle 32G X 4 MM Misc Use one per day to inject victoza   lisinopril 20 MG tablet Commonly known as:  PRINIVIL,ZESTRIL Take 20 mg by mouth daily.   metFORMIN 500 MG 24 hr tablet Commonly known as:  GLUCOPHAGE-XR TAKE 4 TABLETS (2,000 MG TOTAL) BY MOUTH EVERY EVENING.   nitroGLYCERIN 0.4 MG SL tablet Commonly known as:  NITROSTAT Place 1 tablet (0.4 mg total) under the tongue every 5 (five) minutes as needed for chest pain.   Venlafaxine HCl 225 MG Tb24 Take 225 mg by mouth daily.   VICTOZA 18 MG/3ML Sopn Generic drug:  liraglutide INJECT 1.8 MG SUBCUTANEOUSLY ONCE DAILY       Allergies:  Allergies  Allergen Reactions  . Other     seasonal  . Relafen [Nabumetone]     Trouble breathing, break out  . Restasis [Cyclosporine]     Eyes burn and itch    Past Medical History:  Diagnosis Date  . Colon polyps   . Depression 05/10/2013  . Diabetes mellitus without complication ([place])   . Diverticulosis   . Hypertension   . Irritable bowel   . 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 Neuropathy ([place])   . Neuropathy associated with endocrine disorder ([place])   . Obesity (BMI 30-39.9)   . Osteopenia     Past  Surgical History:  Procedure Laterality Date  . arm surgery Right   . plantar faci  Family History  Problem Relation Age of Onset  . Diabetes type II Mother   . Stroke Mother   . Hypertension Mother   . Diabetes Mother   . Heart disease Father   . Hypertension Father   . Diabetes Father   . Heart attack Father   . Heart disease Brother   . Hypertension Brother   . Diabetes Brother   . Kidney disease Brother   . Leukemia Other     brother's grandson  . Diabetes Sister   . Asthma Maternal Grandmother   . Hypertension Maternal Grandmother   . Heart attack Maternal Grandmother   . Heart attack Maternal Grandfather   . Heart attack Paternal Grandmother   . Diabetes Paternal Grandfather   . Heart attack Paternal Grandfather   . Diabetes Brother   . Heart attack Brother   . Diabetes Brother   . Diabetes Brother   . Other Brother     Bright's Disease    Social History:  reports that she has never smoked. She has never used smokeless tobacco. She reports that she does not drink alcohol or use drugs.    Review of Systems   No Hx UTI  Most recent eye exam was 11/15       Lipids: These have been controlled with Lipitor 20 mg daily       Lab Results  Component Value Date   CHOL 152 01/06/2016   HDL 68 01/06/2016   LDLCALC 67 01/06/2016   TRIG 86 01/06/2016   CHOLHDL 2.2 01/06/2016                  The blood pressure has been high for several years treated by PCP, is on lisinopril 31m Home BP Recently 1791-505/69-79diastolic        She has a long history of Numbness, pain, tingling or burning in feet  Symptoms relieved with  gabapentin to 600 mg 3 times a day  Taking tramadol as needed    Foot exam in 3/17 showed only decreased sensation on the right first and left fifth toes   Physical Examination:  BP 122/76   Pulse 73   Temp 97.8 F (36.6 C)   Resp 16   Ht 5' 6"  (1.676 m)   Wt 220 lb 12.8 oz (100.2 kg)   SpO2 97%   BMI 35.64 kg/m     ASSESSMENT:  Diabetes type 2, uncontrolled with obesity She is on a regimen of Victoza, Invokana 100 mg,  metformin 2 g and low-dose Amaryl  See history of present illness for detailed discussion of current diabetes management, blood sugar patterns and problems identified  Although blood sugars had been previously good she is having higher readings which she thinks is from stress from family illness and not being able to take care of herself consistently She is still trying to exercise Has sporadic high readings after meals which she is checking mostly in the afternoons  HYPERTENSION: Well controlled with lisinopril  PLAN:   Improve efficacy of Invokana and help her with better control and weight loss she will take 300 mg daily At the same time she will reduce her lisinopril to half tablet She will check blood pressure consistently at home Will need A1c in follow-up She'll try to be consistent with diet and glucose monitoring as discussed   Patient Instructions  Check blood sugars on waking up  2x weekly  Also check blood sugars about 2 hours after a meal and  do this after different meals by rotation  Recommended blood sugar levels on waking up is 90-130 and about 2 hours after meal is 130-160  Please bring your blood sugar monitor to each visit, thank you  Cut Lisinopril to 1/2 with 200 or 335m [place]10/24/2017, 7:47 AM     Note: This office note was prepared with DEstate agent Any transcriptional errors that result from this process are unintentional.

## 2016-05-08 LAB — COMPREHENSIVE METABOLIC PANEL
ALBUMIN: 4.3 g/dL (ref 3.5–5.2)
ALK PHOS: 95 U/L (ref 39–117)
ALT: 19 U/L (ref 0–35)
AST: 15 U/L (ref 0–37)
BILIRUBIN TOTAL: 0.3 mg/dL (ref 0.2–1.2)
BUN: 15 mg/dL (ref 6–23)
CALCIUM: 9.6 mg/dL (ref 8.4–10.5)
CO2: 26 mEq/L (ref 19–32)
Chloride: 104 mEq/L (ref 96–112)
Creatinine, Ser: 0.9 mg/dL (ref 0.40–1.20)
GFR: 68.04 mL/min (ref >60.00)
Glucose, Bld: 100 mg/dL — ABNORMAL HIGH (ref 70–99)
POTASSIUM: 3.8 meq/L (ref 3.5–5.1)
Sodium: 139 mEq/L (ref 135–145)
TOTAL PROTEIN: 6.7 g/dL (ref 6.0–8.3)

## 2016-05-15 ENCOUNTER — Telehealth: Payer: Self-pay | Admitting: Endocrinology

## 2016-05-15 ENCOUNTER — Other Ambulatory Visit: Payer: Self-pay

## 2016-05-15 MED ORDER — CANAGLIFLOZIN 100 MG PO TABS: 300.0000 mg | ORAL_TABLET | Freq: Every day | ORAL | 3 refills | Status: DC

## 2016-05-15 NOTE — Telephone Encounter (Signed)
300 mg invokana needs to be called into [facility] in [place]   BP has been running 90/60

## 2016-05-15 NOTE — Telephone Encounter (Signed)
Ordered 05/15/16

## 2016-05-16 ENCOUNTER — Telehealth: Payer: Self-pay | Admitting: Endocrinology

## 2016-05-16 NOTE — Telephone Encounter (Signed)
Pt called and said that her Invokana was only called in for 10 days worth, Dr. Dwyane Dee wanted her to increase to 300MG , but the script was sent in only for 30 pills and she needs more.  Also to let Dr. Dwyane Dee know that her Blood Pressure is running low, around 90/60.  She would also like to know her lab results.

## 2016-05-17 NOTE — Telephone Encounter (Signed)
rx was sent #[phone]  Refills.  Do you have her lab results? Please advise about BP. Thanks.

## 2016-05-17 NOTE — Telephone Encounter (Signed)
If she is taking a half tablet of lisinopril now she can stop it completely.  Her labs showed normal kidney and liver functions and potassium

## 2016-05-18 NOTE — Telephone Encounter (Signed)
Information given to the pt.

## 2016-07-21 ENCOUNTER — Other Ambulatory Visit: Payer: Self-pay | Admitting: Endocrinology

## 2016-08-08 ENCOUNTER — Encounter: Payer: Self-pay | Admitting: Endocrinology

## 2016-08-08 ENCOUNTER — Ambulatory Visit (INDEPENDENT_AMBULATORY_CARE_PROVIDER_SITE_OTHER): Payer: BLUE CROSS/BLUE SHIELD | Admitting: Endocrinology

## 2016-08-08 VITALS — BP 130/74 | HR 71 | Ht 66.0 in | Wt 217.0 lb

## 2016-08-08 DIAGNOSIS — E1140 Type 2 diabetes mellitus with diabetic neuropathy, unspecified: Secondary | ICD-10-CM

## 2016-08-08 DIAGNOSIS — E1142 Type 2 diabetes mellitus with diabetic polyneuropathy: Secondary | ICD-10-CM

## 2016-08-08 DIAGNOSIS — E1165 Type 2 diabetes mellitus with hyperglycemia: Secondary | ICD-10-CM

## 2016-08-08 LAB — COMPREHENSIVE METABOLIC PANEL
ALT: 23 U/L (ref 0–35)
AST: 17 U/L (ref 0–37)
Albumin: 4.4 g/dL (ref 3.5–5.2)
Alkaline Phosphatase: 108 U/L (ref 39–117)
BUN: 11 mg/dL (ref 6–23)
CHLORIDE: 103 meq/L (ref 96–112)
CO2: 26 mEq/L (ref 19–32)
Calcium: 9.4 mg/dL (ref 8.4–10.5)
Creatinine, Ser: 0.91 mg/dL (ref 0.40–1.20)
GFR: 67.12 mL/min (ref >60.00)
GLUCOSE: 165 mg/dL — AB (ref 70–99)
POTASSIUM: 3.9 meq/L (ref 3.5–5.1)
Sodium: 138 mEq/L (ref 135–145)
TOTAL PROTEIN: 7.1 g/dL (ref 6.0–8.3)
Total Bilirubin: 0.5 mg/dL (ref 0.2–1.2)

## 2016-08-08 LAB — POCT GLYCOSYLATED HEMOGLOBIN (HGB A1C): HEMOGLOBIN A1C: 7.2

## 2016-08-08 LAB — MICROALBUMIN / CREATININE URINE RATIO
Creatinine,U: 54.3 mg/dL
MICROALB/CREAT RATIO: 1.3 mg/g (ref 0.0–30.0)

## 2016-08-08 MED ORDER — CANAGLIFLOZIN 300 MG PO TABS: 300.0000 mg | ORAL_TABLET | Freq: Every day | ORAL | 3 refills | Status: DC

## 2016-08-08 NOTE — Patient Instructions (Addendum)
Check blood sugars on waking up  2x weekly  Also check blood sugars about 2 hours after a meal and do this after different meals by rotation  Recommended blood sugar levels on waking up is 90-130 and about 2 hours after meal is 130-160  Please bring your blood sugar monitor to each visit, thank you  

## 2016-08-08 NOTE — Progress Notes (Signed)
Patient ID: [name], female   DOB: 07-Jul-1956, 61 y.o.   MRN: [id number]           Reason for Appointment: Follow-up for Type 2 Diabetes  Referring physician: Kathryne Eriksson  History of Present Illness:          Diagnosis: Type 2 diabetes mellitus, date of diagnosis:  2010       Past history:  She was having symptoms of neuropathy at onset At that time she was started on metformin alone, 500 mg twice a day and this was continued unchanged until recently Apparently she was also given Onglyza about 4 years ago but details of her control at that time is not available Her A1c levels have been mild increase over the last year but she thinks her blood sugars had been generally good with only some readings up to 200. A1c was 7.1 in 1/15 and she was continued on metformin and Onglyza When her A1c was 7.9 in August she was told to double her metformin to 2 g daily She  had blood sugars of 200 or more prior to her initial consultation  Recent history:   Non-insulin hypoglycemic drugs the patient is taking are:   metformin ER 2 g daily, Invokana 100 mg daily Amaryl 33m acs , Victoza 1.8 mg      Her Trulicity was changed to Victoza because of diarrhea and abdominal pain and has been able to tolerate Victoza  1.8 hs  When A1c was 7.6 she was started on Invokana 100 mg in 11/16, this was subsequently increased when her A1c was 7.5 in 10/17  Her A1c is slightly better at 7.2, was 7.5  Current blood sugar patterns and problems identified:  Her blood sugars have been checked at different times but her first reading is usually midday before her first meal and not on waking up  The blood sugars have been variable and were higher last month and also last night and this morning  Although she was told to increase her Invokana up to 300 mg not clear if she has much better control  However she has lost 3 pounds  She generally tries to do fairly well with diet with occasional slip ups  She is  still taking only half a tablet of Amaryl in the evening at suppertime with her metformin  Has been compliant with Victoza at bedtime  She is still trying to do fairly well with her exercise  She thinks she is still having stress from family issues which probably affects her consistency   Side effects from medications have been: Diarrhea, abdominal pain with Trulicity Compliance with the medical regimen: Good  Glucose monitoring:  done once or twice a day         Glucometer: Contour       Blood Glucose readings from home monitor: recent 114-177  Mean values apply above for all meters except median for One Touch  PRE-MEAL Fasting Lunch Dinner Bedtime Overall  Glucose range: 155-238   114-313  133-223    Mean/median:     176     Self-care: The diet that the patient has been following is: tries to limit carbohydrates .     Meals: 3 meals per day. Breakfast is yogurt/ cereal at 9 am; , lunch is a sandwich, and dinner at 5 pm will have baked chicken, starch and vegetables. Has snacks with yogurt, cottage cheese, fruit, nuts and  rice cakes  Exercise: walks up to 30 minutes, 3-5 days a week, [facility] [place] [facility] visit, most recent: 9/15              Weight history:  Wt Readings from Last 3 Encounters:  08/08/16 217 lb (98.4 kg)  05/07/16 220 lb 12.8 oz (100.2 kg)  01/06/16 219 lb (99.3 kg)    Glycemic control:     Lab Results  Component Value Date   HGBA1C 7.5 05/07/2016   HGBA1C 6.9 01/06/2016   HGBA1C 7.0 10/06/2015   Lab Results  Component Value Date   MICROALBUR <0.7 05/25/2015   LDLCALC 67 01/06/2016   CREATININE 0.90 05/07/2016       Allergies as of 08/08/2016      Reactions   Other    seasonal   Relafen [nabumetone]    Trouble breathing, break out   Restasis [cyclosporine]    Eyes burn and itch      Medication List       Accurate as of 08/08/16  2:14 PM. Always use your most recent med list.          acetaminophen 650 MG CR  tablet Commonly known as:  TYLENOL Take 1,300 mg by mouth every 8 (eight) hours as needed for pain.   aspirin 81 MG EC tablet Take 1 tablet (81 mg total) by mouth daily.   atorvastatin 20 MG tablet Commonly known as:  LIPITOR Take 1 tablet (20 mg total) by mouth daily.   BAYER CONTOUR NEXT MONITOR w/Device Kit Use to check blood sugar 2 times per day dx code E11.49   BAYER MICROLET LANCETS lancets Use as instructed to check blood sugar 2 times per day dx code E11.49   canagliflozin 300 MG Tabs tablet Commonly known as:  INVOKANA Take 1 tablet (300 mg total) by mouth daily before breakfast.   Diclofenac Sodium 3 % Gel as needed.   fluconazole 150 MG tablet Commonly known as:  DIFLUCAN Take 1 tablet (150 mg total) by mouth once.   gabapentin 600 MG tablet Commonly known as:  NEURONTIN TAKE 1 TABLET 3 TIMES PER DAY AS NEEDED   glimepiride 1 MG tablet Commonly known as:  AMARYL TAKE 1 TABLET (1 MG TOTAL) BY MOUTH DAILY WITH BREAKFAST.   glucose blood test strip Commonly known as:  BAYER CONTOUR NEXT TEST Use as instructed to check blood sugar 2 times per day dx code E11.49   Insulin Pen Needle 32G X 4 MM Misc Use one per day to inject victoza   metFORMIN 500 MG 24 hr tablet Commonly known as:  GLUCOPHAGE-XR TAKE 4 TABLETS (2,000 MG TOTAL) BY MOUTH EVERY EVENING.   nitroGLYCERIN 0.4 MG SL tablet Commonly known as:  NITROSTAT Place 1 tablet (0.4 mg total) under the tongue every 5 (five) minutes as needed for chest pain.   Venlafaxine HCl 225 MG Tb24 Take 225 mg by mouth daily.   VICTOZA 18 MG/3ML Sopn Generic drug:  liraglutide INJECT 1.8 MG SUBCUTANEOUSLY ONCE DAILY       Allergies:  Allergies  Allergen Reactions  . Other     seasonal  . Relafen [Nabumetone]     Trouble breathing, break out  . Restasis [Cyclosporine]     Eyes burn and itch    Past Medical History:  Diagnosis Date  . Colon polyps   . Depression 05/10/2013  . Diabetes mellitus  without complication ([place])   . Diverticulosis   . Hypertension   . Irritable  bowel   . 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 Neuropathy ([place])   . Neuropathy associated with endocrine disorder ([place])   . Obesity (BMI 30-39.9)   . Osteopenia     Past Surgical History:  Procedure Laterality Date  . arm surgery Right   . plantar faci      Family History  Problem Relation Age of Onset  . Diabetes type II Mother   . Stroke Mother   . Hypertension Mother   . Diabetes Mother   . Heart disease Father   . Hypertension Father   . Diabetes Father   . Heart attack Father   . Heart disease Brother   . Hypertension Brother   . Diabetes Brother   . Kidney disease Brother   . Leukemia Other     brother's grandson  . Diabetes Sister   . Asthma Maternal Grandmother   . Hypertension Maternal Grandmother   . Heart attack Maternal Grandmother   . Heart attack Maternal Grandfather   . Heart attack Paternal Grandmother   . Diabetes Paternal Grandfather   . Heart attack Paternal Grandfather   . Diabetes Brother   . Heart attack Brother   . Diabetes Brother   . Diabetes Brother   . Other Brother     Bright's Disease    Social History:  reports that she has never smoked. She has never used smokeless tobacco. She reports that she does not drink alcohol or use drugs.    Review of Systems    Most recent eye exam was In 2016, she is due for follow-up       Lipids: These have been controlled with Lipitor 20 mg daily       Lab Results  Component Value Date   CHOL 152 01/06/2016   HDL 68 01/06/2016   LDLCALC 67 01/06/2016   TRIG 86 01/06/2016   CHOLHDL 2.2 01/06/2016                  The blood pressure has been high for several years treated by PCP, was on lisinopril 77m This was stopped when blood pressure was significantly low after increasing Invokana  Home BP Recently  1161W/96-04diastolic        She has a long history of Numbness, pain, tingling or  burning in feet  Symptoms relieved with  gabapentin to 600 mg 3 times a day She is also on Effexor for depression   Taking tramadol as needed Also    Foot exam in 3/17 showed only decreased sensation on the right first and left fifth toes   Physical Examination:  BP 130/74   Pulse 71   Ht 5' 6"  (1.676 m)   Wt 217 lb (98.4 kg)   SpO2 97%   BMI 35.02 kg/m    ASSESSMENT:  Diabetes type 2, uncontrolled with obesity She is on a regimen of Victoza 1.8 mg, Invokana 300 mg ,  metformin 2 g and low-dose Amaryl   See history of present illness for detailed discussion of current diabetes management, blood sugar patterns and problems identified  A1c is still about target, now 7.2 despite her multiple medications   again she thinks her sugars are variable because of stress However she probably has more consistently high readings before her first meal at noon She does not check sugars on waking up Has sporadic high postprandial readings also  Has done little better with her weight loss with increasing her Invokana  and also continuing to exercise regularly  HYPERTENSION: Well controlled without lisinopril, benefiting from Invokana  To have lipids checked with PCP on her upcoming physical  PLAN:   She will try to take the full tablet of Amaryl at bedtime More consistent monitoring Call if blood sugars not well controlled Check chemistry panel today   Patient Instructions  Check blood sugars on waking up  2x weekly  Also check blood sugars about 2 hours after a meal and do this after different meals by rotation  Recommended blood sugar levels on waking up is 90-130 and about 2 hours after meal is 130-160  Please bring your blood sugar monitor to each visit, thank you          [facility] 08/08/2016, 2:14 PM     Note: This office note was prepared with Dragon voice recognition system technology. Any transcriptional errors that result from this process are  unintentional.

## 2016-08-20 ENCOUNTER — Other Ambulatory Visit: Payer: Self-pay | Admitting: Endocrinology

## 2016-09-12 ENCOUNTER — Other Ambulatory Visit: Payer: Self-pay | Admitting: Endocrinology

## 2016-09-15 DIAGNOSIS — L821 Other seborrheic keratosis: Secondary | ICD-10-CM | POA: Insufficient documentation

## 2016-09-28 DIAGNOSIS — L570 Actinic keratosis: Secondary | ICD-10-CM | POA: Insufficient documentation

## 2016-10-07 ENCOUNTER — Other Ambulatory Visit: Payer: Self-pay | Admitting: Endocrinology

## 2016-11-07 ENCOUNTER — Ambulatory Visit: Payer: BLUE CROSS/BLUE SHIELD | Admitting: Endocrinology

## 2016-11-26 ENCOUNTER — Other Ambulatory Visit: Payer: Self-pay | Admitting: Endocrinology

## 2016-12-20 ENCOUNTER — Ambulatory Visit (INDEPENDENT_AMBULATORY_CARE_PROVIDER_SITE_OTHER): Payer: BLUE CROSS/BLUE SHIELD | Admitting: Endocrinology

## 2016-12-20 ENCOUNTER — Encounter: Payer: Self-pay | Admitting: Endocrinology

## 2016-12-20 VITALS — BP 130/88 | HR 75 | Ht 66.0 in | Wt 217.0 lb

## 2016-12-20 DIAGNOSIS — I10 Essential (primary) hypertension: Secondary | ICD-10-CM | POA: Diagnosis not present

## 2016-12-20 DIAGNOSIS — E1165 Type 2 diabetes mellitus with hyperglycemia: Secondary | ICD-10-CM | POA: Diagnosis not present

## 2016-12-20 DIAGNOSIS — R5383 Other fatigue: Secondary | ICD-10-CM

## 2016-12-20 DIAGNOSIS — E669 Obesity, unspecified: Secondary | ICD-10-CM | POA: Diagnosis not present

## 2016-12-20 LAB — POCT GLYCOSYLATED HEMOGLOBIN (HGB A1C): Hemoglobin A1C: 7.7

## 2016-12-20 LAB — BASIC METABOLIC PANEL
BUN: 15 mg/dL (ref 6–23)
CO2: 24 meq/L (ref 19–32)
CREATININE: 0.9 mg/dL (ref 0.40–1.20)
Calcium: 9.3 mg/dL (ref 8.4–10.5)
Chloride: 108 mEq/L (ref 96–112)
GFR: 67.9 mL/min (ref >60.00)
GLUCOSE: 149 mg/dL — AB (ref 70–99)
Potassium: 3.8 mEq/L (ref 3.5–5.1)
Sodium: 140 mEq/L (ref 135–145)

## 2016-12-20 LAB — TSH: TSH: 2.51 u[IU]/mL (ref 0.35–4.50)

## 2016-12-20 MED ORDER — GLIMEPIRIDE 1 MG PO TABS
1.0000 mg | ORAL_TABLET | Freq: Every day | ORAL | 2 refills | Status: DC
Start: 2016-12-20 — End: 2017-09-09

## 2016-12-20 MED ORDER — LISINOPRIL 10 MG PO TABS: 10.0000 mg | ORAL_TABLET | Freq: Every day | ORAL | 3 refills | Status: DC

## 2016-12-20 MED ORDER — INSULIN DEGLUDEC 100 UNIT/ML ~~LOC~~ SOPN: 20.0000 [IU] | PEN_INJECTOR | Freq: Every day | SUBCUTANEOUS | 1 refills | Status: DC

## 2016-12-20 NOTE — Progress Notes (Signed)
Patient ID: [name], female   DOB: 09-30-1955, 61 y.o.   MRN: [id number]           Reason for Appointment: Follow-up for Type 2 Diabetes  Referring physician: Kathryne Eriksson  History of Present Illness:          Diagnosis: Type 2 diabetes mellitus, date of diagnosis:  2010       Past history:  She was having symptoms of neuropathy at onset At that time she was started on metformin alone, 500 mg twice a day and this was continued unchanged until recently Apparently she was also given Onglyza about 4 years ago but details of her control at that time is not available Her A1c levels have been mild increase over the last year but she thinks her blood sugars had been generally good with only some readings up to 200. A1c was 7.1 in 1/15 and she was continued on metformin and Onglyza When her A1c was 7.9 in August she was told to double her metformin to 2 g daily She  had blood sugars of 200 or more prior to her initial consultation  Recent history:   Non-insulin hypoglycemic drugs the patient is taking are:  metformin ER 2 g daily, Invokana 300 mg daily Amaryl 64m acs , Victoza 1.8 mg      Her Trulicity was changed to Victoza because of diarrhea and abdominal pain and has been able to tolerate Victoza  1.8 hs  When A1c was 7.6 she was started on Invokana 100 mg in 11/16, this was subsequently increased when her A1c was 7.5 in 10/17  Her A1c is higher at 7.7, was previously slightly better at 7.2  Current blood sugar patterns and problems identified:  Her blood sugars have been mostly high with only a couple of readings around 150 and highest readings are after lunch or supper  She is checking sugar somewhat infrequently but they appear to be higher in the last few days especially  She is compliant with all her medications as directed, is on a regimen of 4 different prescriptions now  She has not been able to be consistently compliant with her diet despite taking Victoza  However  she has been trying to do more walking recently  Weight is about the same  Previously had not benefited from increasing her Invokana   Side effects from medications have been: Diarrhea, abdominal pain with Trulicity Compliance with the medical regimen: Good  Glucose monitoring:  done once  a day         Glucometer: Contour       Blood Glucose readings from home monitor:   Mean values apply above for all meters except median for One Touch  PRE-MEAL Fasting Lunch Dinner Bedtime Overall  Glucose range: 188, 201 152      Mean/median:     188   POST-MEAL PC Breakfast PC Lunch PC Dinner  Glucose range:   1 55-253  1 55-278   Mean/median:        Mean values apply above for all meters except median for One Touch  PRE-MEAL Fasting Lunch Dinner Bedtime Overall  Glucose range: 155-238   114-313  133-223    Mean/median:     176     Self-care: The diet that the patient has been following is: tries to limit carbohydrates .     Meals: 3 meals per day. Breakfast is yogurt/ cereal at 9 am; , lunch is a sandwich, and dinner at  5 pm will have baked chicken, starch and vegetables. Has snacks with yogurt, cottage cheese, fruit, nuts and  rice cakes            Exercise: walks in park    Dietician visit, most recent: 9/15              Weight history:  Wt Readings from Last 3 Encounters:  12/20/16 217 lb (98.4 kg)  08/08/16 217 lb (98.4 kg)  05/07/16 220 lb 12.8 oz (100.2 kg)    Glycemic control:     Lab Results  Component Value Date   HGBA1C 7.7 12/20/2016   HGBA1C 7.2 08/08/2016   HGBA1C 7.5 05/07/2016   Lab Results  Component Value Date   MICROALBUR <0.7 08/08/2016   LDLCALC 67 01/06/2016   CREATININE 0.90 12/20/2016       Allergies as of 12/20/2016      Reactions   Other    seasonal   Relafen [nabumetone]    Trouble breathing, break out   Restasis [cyclosporine]    Eyes burn and itch      Medication List       Accurate as of 12/20/16 11:59 PM. Always use your most  recent med list.          acetaminophen 650 MG CR tablet Commonly known as:  TYLENOL Take 1,300 mg by mouth every 8 (eight) hours as needed for pain.   aspirin 81 MG EC tablet Take 1 tablet (81 mg total) by mouth daily.   atorvastatin 20 MG tablet Commonly known as:  LIPITOR Take 1 tablet (20 mg total) by mouth daily.   BAYER CONTOUR NEXT MONITOR w/Device Kit Use to check blood sugar 2 times per day dx code E11.49   BAYER CONTOUR NEXT TEST test strip Generic drug:  glucose blood USE AS INSTRUCTED TO CHECK BLOOD SUGAR 2 TIMES PER DAY DX CODE E11.49   BAYER MICROLET LANCETS lancets Use as instructed to check blood sugar 2 times per day dx code E11.49   canagliflozin 300 MG Tabs tablet Commonly known as:  INVOKANA Take 1 tablet (300 mg total) by mouth daily before breakfast.   Diclofenac Sodium 3 % Gel as needed.   fluconazole 150 MG tablet Commonly known as:  DIFLUCAN Take 1 tablet (150 mg total) by mouth once.   gabapentin 600 MG tablet Commonly known as:  NEURONTIN TAKE 1 TABLET 3 TIMES PER DAY AS NEEDED   glimepiride 1 MG tablet Commonly known as:  AMARYL Take 1 tablet (1 mg total) by mouth daily with breakfast.   insulin degludec 100 UNIT/ML Sopn FlexTouch Pen Commonly known as:  TRESIBA FLEXTOUCH Inject 0.2 mLs (20 Units total) into the skin daily.   Insulin Pen Needle 32G X 4 MM Misc Use one per day to inject victoza   lisinopril 10 MG tablet Commonly known as:  PRINIVIL,ZESTRIL Take 1 tablet (10 mg total) by mouth daily.   metFORMIN 500 MG 24 hr tablet Commonly known as:  GLUCOPHAGE-XR TAKE 4 TABLETS (2,000 MG TOTAL) BY MOUTH EVERY EVENING.   nitroGLYCERIN 0.4 MG SL tablet Commonly known as:  NITROSTAT Place 1 tablet (0.4 mg total) under the tongue every 5 (five) minutes as needed for chest pain.   Venlafaxine HCl 225 MG Tb24 Take 225 mg by mouth daily.   VICTOZA 18 MG/3ML Sopn Generic drug:  liraglutide INJECT 1.8 MG SUBCUTANEOUSLY ONCE  DAILY       Allergies:  Allergies  Allergen Reactions  . Other  seasonal  . Relafen [Nabumetone]     Trouble breathing, break out  . Restasis [Cyclosporine]     Eyes burn and itch    Past Medical History:  Diagnosis Date  . Colon polyps   . Depression 05/10/2013  . Diabetes mellitus without complication ([place])   . Diverticulosis   . Hypertension   . Irritable bowel   . 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 Neuropathy   . Neuropathy associated with endocrine disorder ([place])   . Obesity (BMI 30-39.9)   . Osteopenia     Past Surgical History:  Procedure Laterality Date  . arm surgery Right   . plantar faci      Family History  Problem Relation Age of Onset  . Diabetes type II Mother   . Stroke Mother   . Hypertension Mother   . Diabetes Mother   . Heart disease Father   . Hypertension Father   . Diabetes Father   . Heart attack Father   . Heart disease Brother   . Hypertension Brother   . Diabetes Brother   . Kidney disease Brother   . Leukemia Other        brother's grandson  . Diabetes Sister   . Asthma Maternal Grandmother   . Hypertension Maternal Grandmother   . Heart attack Maternal Grandmother   . Heart attack Maternal Grandfather   . Heart attack Paternal Grandmother   . Diabetes Paternal Grandfather   . Heart attack Paternal Grandfather   . Diabetes Brother   . Heart attack Brother   . Diabetes Brother   . Diabetes Brother   . Other Brother        Bright's Disease    Social History:  reports that she has never smoked. She has never used smokeless tobacco. She reports that she does not drink alcohol or use drugs.    Review of Systems   She is complaining about significant amount of fatigue  Most recent eye exam was In 2016, she is due for follow-up       Lipids: These have been controlled with Lipitor 20 mg daily, Needs follow-up       Lab Results  Component Value Date   CHOL 152 01/06/2016   HDL 68  01/06/2016   LDLCALC 67 01/06/2016   TRIG 86 01/06/2016   CHOLHDL 2.2 01/06/2016                  The blood pressure has been high for several years treated by PCP, was on lisinopril 72m This was stopped when blood pressure was significantly low after increasing Invokana  Home BP Has been checked also and appears to be higher now  BP Readings from Last 3 Encounters:  12/20/16 130/88  08/08/16 130/74  05/07/16 122/76        She has a long history of Numbness, pain, tingling or burning in feet  Symptoms relieved with  gabapentin to 600 mg 3 times a day Taking tramadol as needed Also   She is also on Effexor for depression But she is having more issues with depression after her father's death and has not discussed with PCP    Foot exam in 3/17 showed only decreased sensation on the right first and left fifth toes   Physical Examination:  BP 130/88 (Cuff Size: Large)   Pulse 75   Ht _0  (1.676 m)   Wt 217 lb (98.4 kg)   SpO2 96%  BMI 35.02 kg/m   Repeat blood pressure was not improved  ASSESSMENT:  Diabetes type 2, uncontrolled with obesity She is on a regimen of Victoza 1.8 mg, Invokana 300 mg ,  metformin 2 g and low-dose Amaryl   See history of present illness for detailed discussion of current diabetes management, blood sugar patterns and problems identified  A1c is higher at 7.7 but her blood sugars at home are looking more consistently high including in the morning Discussed that since she is already on multiple drugs and not able to get her blood sugars down even with doing some walking she is likely to be insulin deficient now Most likely Amaryl is not a long-term option for her control She can do a little better with her diet but also has been having  stress and depression as before   Has done little better with her weight loss with increasing her Invokana and also continuing to exercise regularly  HYPERTENSION: Blood pressure is trending higher now,  partly from her recent stress  To have lipids checked with PCP   PLAN:   She will start basal insulin Discussed in detail how insulin works, timing of injection, use of the [place] insulin pen, action of insulin with 24-hour duration of effect and titration based on fasting reading She will use a flowsheet that was given to her and explanation was given on how to adjust her dose every 3 days by 2 units to get morning sugars at least under 130 She can start with 8 units for now  She can continue her other medications unchanged, may stop Amaryl next time She does need to continue exercising regularly and watch portions and snacks  She will try to check her blood sugars very consistently especially fasting and some after meals also Check chemistry panel and TSH today especially with her recent fatigue  HYPERTENSION: She will start on 10 mg of lisinopril and watch her blood pressure at home  There are no Patient Instructions on file for this visit.    Counseling time on subjects discussed above is over 50% of today's 25 minute visit    Adabelle Griffiths 12/21/2016, 12:11 PM   Addendum: TSH normal   Note: This office note was prepared with Estate agent. Any transcriptional errors that result from this process are unintentional.

## 2016-12-23 NOTE — Progress Notes (Signed)
Please call to let patient know that the lab results are normal and no further action needed

## 2016-12-24 ENCOUNTER — Other Ambulatory Visit: Payer: Self-pay | Admitting: Family Medicine

## 2016-12-24 DIAGNOSIS — Z1231 Encounter for screening mammogram for malignant neoplasm of breast: Secondary | ICD-10-CM

## 2017-01-09 ENCOUNTER — Ambulatory Visit
Admission: RE | Admit: 2017-01-09 | Discharge: 2017-01-09 | Disposition: A | Discharge status: Home / Self Care | Payer: BLUE CROSS/BLUE SHIELD | Source: Ambulatory Visit | Attending: Family Medicine | Admitting: Family Medicine

## 2017-01-09 ENCOUNTER — Other Ambulatory Visit: Payer: Self-pay | Admitting: Endocrinology

## 2017-01-09 DIAGNOSIS — Z1231 Encounter for screening mammogram for malignant neoplasm of breast: Secondary | ICD-10-CM

## 2017-01-28 ENCOUNTER — Ambulatory Visit (INDEPENDENT_AMBULATORY_CARE_PROVIDER_SITE_OTHER): Payer: BLUE CROSS/BLUE SHIELD | Admitting: Endocrinology

## 2017-01-28 ENCOUNTER — Encounter: Payer: Self-pay | Admitting: Endocrinology

## 2017-01-28 VITALS — BP 118/80 | HR 77 | Ht 66.0 in | Wt 219.6 lb

## 2017-01-28 DIAGNOSIS — I10 Essential (primary) hypertension: Secondary | ICD-10-CM

## 2017-01-28 DIAGNOSIS — E1140 Type 2 diabetes mellitus with diabetic neuropathy, unspecified: Secondary | ICD-10-CM

## 2017-01-28 DIAGNOSIS — E1165 Type 2 diabetes mellitus with hyperglycemia: Secondary | ICD-10-CM

## 2017-01-28 NOTE — Progress Notes (Signed)
Patient ID: [name], female   DOB: 25-Jul-1955, 61 y.o.   MRN: [id number]           Reason for Appointment: Follow-up for Type 2 Diabetes  Referring physician: Kathryne Eriksson  History of Present Illness:          Diagnosis: Type 2 diabetes mellitus, date of diagnosis:  2010       Past history:  She was having symptoms of neuropathy at onset At that time she was started on metformin alone, 500 mg twice a day and this was continued unchanged until recently Apparently she was also given Onglyza about 4 years ago but details of her control at that time is not available Her A1c levels have been mild increase over the last year but she thinks her blood sugars had been generally good with only some readings up to 200. A1c was 7.1 in 1/15 and she was continued on metformin and Onglyza When her A1c was 7.9 in August she was told to double her metformin to 2 g daily She  had blood sugars of 200 or more prior to her initial consultation  Her Trulicity was changed to Victoza because of diarrhea and abdominal pain and has been able to tolerate Victoza  1.8 hs  When A1c was 7.6 she was started on Invokana 100 mg in 11/16, this was subsequently increased when her A1c was 7.5 in 10/17   Recent history:   INSULIN regimen: Tresiba 26 units daily  Non-insulin hypoglycemic drugs the patient is taking are:  metformin ER 2 g daily, Invokana 300 mg daily Amaryl 6m acs , Victoza 1.8 mg       Her A1c in June 2018 was higher at 7.7, was previously slightly better at 7.2  Current blood sugar patterns and problems identified:  Her blood sugars had been mostly high including fasting despite taking multiple medications and she was started on TRESIBA on 12/20/16  She was given a flowsheet to titrate the dosage every 3 days and she has continued to do so until about 2 weeks ago when she was taking 26 units  Subsequently her FASTING blood sugars have been fairly consistently under 130 except twice  However  has not checked enough readings after meals lately  Review of her monitor download indicates sporadic high readings in the afternoon and once after supper based on her diet  She also thinks that sometimes she checks sugar soon after eating  She feels better overall with her energy level  Her weight has gone up 2 pounds  However she has been trying to do more walk more regularly   Side effects from medications have been: Diarrhea, abdominal pain with Trulicity Compliance with the medical regimen: Good  Glucose monitoring:  done once  a day         Glucometer: Contour       Blood Glucose readings from home monitor:   FASTING average 137 with the range of 106-185 NONFASTING range 91-329 with average reading after supper at night 149 OVERALL average blood sugar 145 with mostly morning readings recently   Self-care: The diet that the patient has been following is: tries to limit carbohydrates .     Meals: 3 meals per day. Breakfast is yogurt/ cereal at 9 am; , lunch is a sandwich, and dinner at 5 pm will have baked chicken, starch and vegetables. Has snacks with yogurt, cottage cheese, fruit, nuts and  rice cakes  Exercise: walks in park/treadmill    Dietician visit, most recent: 9/15              Weight history:  Wt Readings from Last 3 Encounters:  01/28/17 219 lb 9.6 oz (99.6 kg)  12/20/16 217 lb (98.4 kg)  08/08/16 217 lb (98.4 kg)    Glycemic control:     Lab Results  Component Value Date   HGBA1C 7.7 12/20/2016   HGBA1C 7.2 08/08/2016   HGBA1C 7.5 05/07/2016   Lab Results  Component Value Date   MICROALBUR <0.7 08/08/2016   LDLCALC 67 01/06/2016   CREATININE 0.90 12/20/2016       Allergies as of 01/28/2017      Reactions   Other    seasonal   Relafen [nabumetone]    Trouble breathing, break out   Restasis [cyclosporine]    Eyes burn and itch      Medication List       Accurate as of 01/28/17  5:00 PM. Always use your most recent med list.           acetaminophen 650 MG CR tablet Commonly known as:  TYLENOL Take 1,300 mg by mouth every 8 (eight) hours as needed for pain.   aspirin 81 MG EC tablet Take 1 tablet (81 mg total) by mouth daily.   atorvastatin 20 MG tablet Commonly known as:  LIPITOR Take 1 tablet (20 mg total) by mouth daily.   BAYER CONTOUR NEXT MONITOR w/Device Kit Use to check blood sugar 2 times per day dx code E11.49   BAYER CONTOUR NEXT TEST test strip Generic drug:  glucose blood USE AS INSTRUCTED TO CHECK BLOOD SUGAR 2 TIMES PER DAY DX CODE E11.49   BAYER MICROLET LANCETS lancets Use as instructed to check blood sugar 2 times per day dx code E11.49   fluconazole 150 MG tablet Commonly known as:  DIFLUCAN Take 1 tablet (150 mg total) by mouth once.   gabapentin 600 MG tablet Commonly known as:  NEURONTIN TAKE 1 TABLET 3 TIMES PER DAY AS NEEDED   glimepiride 1 MG tablet Commonly known as:  AMARYL Take 1 tablet (1 mg total) by mouth daily with breakfast.   insulin degludec 100 UNIT/ML Sopn FlexTouch Pen Commonly known as:  TRESIBA FLEXTOUCH Inject 0.2 mLs (20 Units total) into the skin daily.   Insulin Pen Needle 32G X 4 MM Misc Use one per day to inject victoza   INVOKANA 300 MG Tabs tablet Generic drug:  canagliflozin TAKE 1 TABLET (300 MG TOTAL) BY MOUTH DAILY BEFORE BREAKFAST.   lisinopril 10 MG tablet Commonly known as:  PRINIVIL,ZESTRIL Take 1 tablet (10 mg total) by mouth daily.   metFORMIN 500 MG 24 hr tablet Commonly known as:  GLUCOPHAGE-XR TAKE 4 TABLETS (2,000 MG TOTAL) BY MOUTH EVERY EVENING.   nitroGLYCERIN 0.4 MG SL tablet Commonly known as:  NITROSTAT Place 1 tablet (0.4 mg total) under the tongue every 5 (five) minutes as needed for chest pain.   Venlafaxine HCl 225 MG Tb24 Take 225 mg by mouth daily.   VICTOZA 18 MG/3ML Sopn Generic drug:  liraglutide INJECT 1.8 MG SUBCUTANEOUSLY ONCE DAILY       Allergies:  Allergies  Allergen Reactions  . Other       seasonal  . Relafen [Nabumetone]     Trouble breathing, break out  . Restasis [Cyclosporine]     Eyes burn and itch    Past Medical History:  Diagnosis Date  .  Colon polyps   . Depression 05/10/2013  . Diabetes mellitus without complication ([place])   . Diverticulosis   . Hypertension   . Irritable bowel   . 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 Neuropathy   . Neuropathy associated with endocrine disorder ([place])   . Obesity (BMI 30-39.9)   . Osteopenia     Past Surgical History:  Procedure Laterality Date  . arm surgery Right   . plantar faci      Family History  Problem Relation Age of Onset  . Diabetes type II Mother   . Stroke Mother   . Hypertension Mother   . Diabetes Mother   . Heart disease Father   . Hypertension Father   . Diabetes Father   . Heart attack Father   . Heart disease Brother   . Hypertension Brother   . Diabetes Brother   . Kidney disease Brother   . Diabetes Sister   . Asthma Maternal Grandmother   . Hypertension Maternal Grandmother   . Heart attack Maternal Grandmother   . Heart attack Maternal Grandfather   . Heart attack Paternal Grandmother   . Diabetes Paternal Grandfather   . Heart attack Paternal Grandfather   . Diabetes Brother   . Heart attack Brother   . Diabetes Brother   . Diabetes Brother   . Other Brother        Bright's Disease  . Leukemia Other        brother's grandson    Social History:  reports that she has never smoked. She has never used smokeless tobacco. She reports that she does not drink alcohol or use drugs.    Review of Systems    Most recent eye exam was In 6/18       Lipids: These have been controlled with Lipitor 20 mg daily, Needs follow-up       Lab Results  Component Value Date   CHOL 152 01/06/2016   HDL 68 01/06/2016   LDLCALC 67 01/06/2016   TRIG 86 01/06/2016   CHOLHDL 2.2 01/06/2016                  The blood pressure has been high for several years treated  by PCP, was on lisinopril 57m This was stopped when blood pressure was significantly low after increasing Invokana However in 6/18 because of her diastolic being 88 lisinopril 10 mg has been started again  She thinks that she sometimes has swelling in her hands and feet especially in the evening  Home BP Has been checked also: Recent readings are 118/70-80  BP Readings from Last 3 Encounters:  01/28/17 118/80  12/20/16 130/88  08/08/16 130/74        She has a long history of Numbness, pain, tingling or burning in feet  Symptoms relieved with  gabapentin to 600 mg 3 times a day, she will have symptoms if she does not take them regularly Taking tramadol as needed    No claudication symptoms on walking   Foot exam in 7/18 showed decreased sensation in some of her toes especially left  TSH was checked for her symptoms of fatigue and this was normal  Physical Examination:  BP 118/80   Pulse 77   Ht 5' 6"  (1.676 m)   Wt 219 lb 9.6 oz (99.6 kg)   SpO2 98%   BMI 35.44 kg/m     Diabetic Foot Exam - Simple   Simple Foot Form Diabetic  Foot exam was performed with the following findings:  Yes   Visual Inspection No deformities, no ulcerations, no other skin breakdown bilaterally:  Yes Sensation Testing See comments:  Yes Pulse Check Comments Decreased monofilament sensation in left plantar surfaces distally and also first and fifth left toes as well as fifth toe on the right Right posterior tibialis not felt otherwise pulses normal    No ankle edema present  ASSESSMENT:  Diabetes type 2, uncontrolled with obesity She is on a regimen of TRESIBA 26 units daily, Victoza 1.8 mg, Invokana 300 mg ,  metformin 2 g and low-dose Amaryl   See history of present illness for detailed discussion of current diabetes management, blood sugar patterns and problems identified  Blood sugars are improving with adding basal insulin, now taking 26 units Fasting readings are mostly at target  recently but she can adjust further if blood sugars are out of range She has sporadic high postprandial readings but has not checked enough readings lately She is more motivated to do exercise also  She has gained 2 pounds but discussed that this is related to improved blood sugar control mostly  HYPERTENSION: Blood pressure is better with adding lisinopril  To have lipids checked with PCP   PLAN:   She will continue the same doses of basal insulin However she does need to check postprandial readings consistently, discussed blood sugar targets at 2 hours after eating This may help her with being consistent with low carbohydrate and low glycemic index meals She can stop Amaryl and let us know if she tends to have high postprandial reading  NEUROPATHY: Discussed foot care principles, has mild persistent numbness  HYPERTENSION: She will continue on 10 mg of lisinopril and watch her blood pressure at home  ?  Edema Currently does not need diuretics, edema is minimal and tolerable  Patient Instructions  Take more sugars after pm meal     Counseling time on subjects discussed in assessment and plan sections is over 50% of today's 25 minute visit   Jahon Bart 01/28/2017, 5:00 PM     Note: This office note was prepared with Estate agent. Any transcriptional errors that result from this process are unintentional.

## 2017-01-28 NOTE — Patient Instructions (Signed)
Take more sugars after pm meal

## 2017-05-01 ENCOUNTER — Ambulatory Visit: Payer: BLUE CROSS/BLUE SHIELD | Admitting: Endocrinology

## 2017-05-02 ENCOUNTER — Other Ambulatory Visit: Payer: Self-pay | Admitting: Endocrinology

## 2017-05-05 ENCOUNTER — Other Ambulatory Visit: Payer: Self-pay | Admitting: Endocrinology

## 2017-05-08 ENCOUNTER — Ambulatory Visit: Payer: BLUE CROSS/BLUE SHIELD | Admitting: Endocrinology

## 2017-05-12 ENCOUNTER — Other Ambulatory Visit: Payer: Self-pay | Admitting: Endocrinology

## 2017-05-22 ENCOUNTER — Encounter: Payer: Self-pay | Admitting: Endocrinology

## 2017-05-22 ENCOUNTER — Ambulatory Visit: Payer: BLUE CROSS/BLUE SHIELD | Admitting: Endocrinology

## 2017-05-22 VITALS — BP 128/74 | HR 75 | Ht 66.0 in | Wt 224.4 lb

## 2017-05-22 DIAGNOSIS — E1165 Type 2 diabetes mellitus with hyperglycemia: Secondary | ICD-10-CM | POA: Diagnosis not present

## 2017-05-22 DIAGNOSIS — Z23 Encounter for immunization: Secondary | ICD-10-CM | POA: Diagnosis not present

## 2017-05-22 DIAGNOSIS — Z794 Long term (current) use of insulin: Secondary | ICD-10-CM

## 2017-05-22 DIAGNOSIS — E782 Mixed hyperlipidemia: Secondary | ICD-10-CM | POA: Diagnosis not present

## 2017-05-22 LAB — COMPREHENSIVE METABOLIC PANEL
ALT: 14 U/L (ref 0–35)
AST: 13 U/L (ref 0–37)
Albumin: 3.9 g/dL (ref 3.5–5.2)
Alkaline Phosphatase: 78 U/L (ref 39–117)
BUN: 16 mg/dL (ref 6–23)
CALCIUM: 9.5 mg/dL (ref 8.4–10.5)
CHLORIDE: 103 meq/L (ref 96–112)
CO2: 30 meq/L (ref 19–32)
CREATININE: 0.86 mg/dL (ref 0.40–1.20)
GFR: 71.46 mL/min (ref >60.00)
Glucose, Bld: 124 mg/dL — ABNORMAL HIGH (ref 70–99)
POTASSIUM: 3.7 meq/L (ref 3.5–5.1)
SODIUM: 138 meq/L (ref 135–145)
Total Bilirubin: 0.4 mg/dL (ref 0.2–1.2)
Total Protein: 6.6 g/dL (ref 6.0–8.3)

## 2017-05-22 LAB — LIPID PANEL
CHOL/HDL RATIO: 3
Cholesterol: 169 mg/dL (ref 0–200)
HDL: 66.4 mg/dL (ref >39.00)
LDL CALC: 84 mg/dL (ref 0–99)
NONHDL: 102.47
Triglycerides: 94 mg/dL (ref 0.0–149.0)
VLDL: 18.8 mg/dL (ref 0.0–40.0)

## 2017-05-22 LAB — POCT GLYCOSYLATED HEMOGLOBIN (HGB A1C): Hemoglobin A1C: 6.5

## 2017-05-22 MED ORDER — SEMAGLUTIDE(0.25 OR 0.5MG/DOS) 2 MG/1.5ML ~~LOC~~ SOPN: 0.5000 mg | PEN_INJECTOR | SUBCUTANEOUS | 2 refills | Status: DC

## 2017-05-22 NOTE — Patient Instructions (Signed)
Stay on the same dose of Tresiba 26 units daily  Start regular exercise with walking  Check more readings fasting and only adjusted Tresiba if morning sugars are out of line  Ozempic 0.25 mg weekly when Victoza runs out and after 2 weeks start taking 0.5 mg if no nausea  Blood sugar targets after meals at least under 160 and in the morning and 130

## 2017-05-22 NOTE — Progress Notes (Signed)
Patient ID: [name], female   DOB: [birth year], 61 y.o.   MRN: [id number]           Reason for Appointment: Follow-up for Type 2 Diabetes  Referring physician: Kathryne Eriksson  History of Present Illness:          Diagnosis: Type 2 diabetes mellitus, date of diagnosis:  2010       Past history:  She was having symptoms of neuropathy at onset At that time she was started on metformin alone, 500 mg twice a day and this was continued unchanged until recently Apparently she was also given Onglyza about 4 years ago but details of her control at that time is not available Her A1c levels have been mild increase over the last year but she thinks her blood sugars had been generally good with only some readings up to 200. A1c was 7.1 in 1/15 and she was continued on metformin and Onglyza When her A1c was 7.9 in August she was told to double her metformin to 2 g daily She  had blood sugars of 200 or more prior to her initial consultation  Her Trulicity was changed to Victoza because of diarrhea and abdominal pain and has been able to tolerate Victoza  1.8 hs  When A1c was 7.6 she was started on Invokana 100 mg in 11/16, this was subsequently increased when her A1c was 7.5 in 10/17 She has been on insulin since 12/2016  Recent history:   INSULIN regimen: Tresiba 22-26 units daily  Non-insulin hypoglycemic drugs the patient is taking are:  metformin ER 2 g daily, Invokana 300 mg daily, Victoza 1.8 mg       Her A1c in June 2018 was higher at 7.7, was previously slightly better at 7.2  Current blood sugar patterns and problems identified:  She has not been checking her sugars in the mornings as directed and also overall infrequent monitoring  Most of her readings are around 8-10 PM at night  These are still fairly good overall  She said that she is again not exercising because of various family issues and stress  However she thinks she is keeping her portions down  Her weight has gone  up 5 pounds   Side effects from medications have been: Diarrhea, abdominal pain with Trulicity Compliance with the medical regimen: Good  Glucose monitoring:  done less than once  a day         Glucometer: Contour       Blood Glucose readings from home monitor:   Mean values apply above for all meters except median for One Touch  PRE-MEAL Fasting Lunch Dinner Bedtime Overall  Glucose range: 156   86-238   Mean/median:    142    Self-care: The diet that the patient has been following is: tries to limit carbohydrates .     Meals: 3 meals per day. Breakfast is yogurt/ cereal at 9 am; , lunch is a sandwich, and dinner at 5 pm will have baked chicken, starch and vegetables. Has snacks with yogurt, cottage cheese, fruit, nuts and  rice cakes            Exercise: none    Dietician visit, most recent: 9/15              Weight history:  Wt Readings from Last 3 Encounters:  05/22/17 224 lb 6.4 oz (101.8 kg)  01/28/17 219 lb 9.6 oz (99.6 kg)  12/20/16 217 lb (98.4 kg)  Glycemic control:     Lab Results  Component Value Date   HGBA1C 6.5 05/22/2017   HGBA1C 7.7 12/20/2016   HGBA1C 7.2 08/08/2016   Lab Results  Component Value Date   MICROALBUR <0.7 08/08/2016   LDLCALC 84 05/22/2017   CREATININE 0.86 05/22/2017       Allergies as of 05/22/2017      Reactions   Other    seasonal   Relafen [nabumetone]    Trouble breathing, break out   Restasis [cyclosporine]    Eyes burn and itch      Medication List        Accurate as of 05/22/17  8:41 PM. Always use your most recent med list.          acetaminophen 650 MG CR tablet Commonly known as:  TYLENOL Take 1,300 mg by mouth every 8 (eight) hours as needed for pain.   aspirin 81 MG EC tablet Take 1 tablet (81 mg total) by mouth daily.   atorvastatin 20 MG tablet Commonly known as:  LIPITOR Take 1 tablet (20 mg total) by mouth daily.   BAYER CONTOUR NEXT MONITOR w/Device Kit Use to check blood sugar 2 times per  day dx code E11.49   BAYER CONTOUR NEXT TEST test strip Generic drug:  glucose blood USE AS INSTRUCTED TO CHECK BLOOD SUGAR 2 TIMES PER DAY DX CODE E11.49   BAYER MICROLET LANCETS lancets Use as instructed to check blood sugar 2 times per day dx code E11.49   fluconazole 150 MG tablet Commonly known as:  DIFLUCAN Take 1 tablet (150 mg total) by mouth once.   gabapentin 600 MG tablet Commonly known as:  NEURONTIN TAKE 1 TABLET 3 TIMES PER DAY AS NEEDED   glimepiride 1 MG tablet Commonly known as:  AMARYL Take 1 tablet (1 mg total) by mouth daily with breakfast.   insulin degludec 100 UNIT/ML Sopn FlexTouch Pen Commonly known as:  TRESIBA FLEXTOUCH 26 units once daily   Insulin Pen Needle 32G X 4 MM Misc Use one per day to inject victoza   INVOKANA 300 MG Tabs tablet Generic drug:  canagliflozin TAKE 1 TABLET (300 MG TOTAL) BY MOUTH DAILY BEFORE BREAKFAST.   lisinopril 10 MG tablet Commonly known as:  PRINIVIL,ZESTRIL TAKE 1 TABLET BY MOUTH EVERY DAY   metFORMIN 500 MG 24 hr tablet Commonly known as:  GLUCOPHAGE-XR TAKE 4 TABLETS (2,000 MG TOTAL) BY MOUTH EVERY EVENING.   nitroGLYCERIN 0.4 MG SL tablet Commonly known as:  NITROSTAT Place 1 tablet (0.4 mg total) under the tongue every 5 (five) minutes as needed for chest pain.   Semaglutide 0.25 or 0.5 MG/DOSE Sopn Commonly known as:  OZEMPIC Inject 0.5 mg once a week into the skin.   Venlafaxine HCl 225 MG Tb24 Take 225 mg by mouth daily.   VICTOZA 18 MG/3ML Sopn Generic drug:  liraglutide INJECT 1.8 MG SUBCUTANEOUSLY ONCE DAILY       Allergies:  Allergies  Allergen Reactions  . Other     seasonal  . Relafen [Nabumetone]     Trouble breathing, break out  . Restasis [Cyclosporine]     Eyes burn and itch    Past Medical History:  Diagnosis Date  . Colon polyps   . Depression 05/10/2013  . Diabetes mellitus without complication ([place])   . Diverticulosis   . Hypertension   . Irritable bowel   .  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 Neuropathy   .  Neuropathy associated with endocrine disorder ([place])   . Obesity (BMI 30-39.9)   . Osteopenia     Past Surgical History:  Procedure Laterality Date  . arm surgery Right   . plantar faci      Family History  Problem Relation Age of Onset  . Diabetes type II Mother   . Stroke Mother   . Hypertension Mother   . Diabetes Mother   . Heart disease Father   . Hypertension Father   . Diabetes Father   . Heart attack Father   . Heart disease Brother   . Hypertension Brother   . Diabetes Brother   . Kidney disease Brother   . Diabetes Sister   . Asthma Maternal Grandmother   . Hypertension Maternal Grandmother   . Heart attack Maternal Grandmother   . Heart attack Maternal Grandfather   . Heart attack Paternal Grandmother   . Diabetes Paternal Grandfather   . Heart attack Paternal Grandfather   . Diabetes Brother   . Heart attack Brother   . Diabetes Brother   . Diabetes Brother   . Other Brother        Bright's Disease  . Leukemia Other        brother's grandson    Social History:  reports that  has never smoked. she has never used smokeless tobacco. She reports that she does not drink alcohol or use drugs.    Review of Systems    Most recent eye exam was In 6/18       Lipids: These have been controlled with Lipitor 20 mg daily, Needs follow-up       Lab Results  Component Value Date   CHOL 169 05/22/2017   HDL 66.40 05/22/2017   LDLCALC 84 05/22/2017   TRIG 94.0 05/22/2017   CHOLHDL 3 05/22/2017                  The blood pressure has been high for several years treated by PCP, was on lisinopril 25m This was stopped when blood pressure was significantly low after increasing Invokana However in 6/18 because of her diastolic being 88 lisinopril 10 mg has been started again    BP Readings from Last 3 Encounters:  05/22/17 128/74  01/28/17 118/80  12/20/16 130/88        She has a  long history of Numbness, pain, tingling or burning in feet    Symptoms relieved with  gabapentin to 600 mg 3 times a day, she will have symptoms if she does not take this regularly Taking tramadol as needed      Foot exam in 7/18 showed decreased sensation in some of her toes especially left  Occasionally feels a difficulty swallowing  Physical Examination:  BP 128/74   Pulse 75   Ht 5' 6" (1.676 m)   Wt 224 lb 6.4 oz (101.8 kg)   SpO2 97%   BMI 36.22 kg/m   Thyroid not palpable   No edema present  ASSESSMENT:  Diabetes type 2, uncontrolled with obesity She is on a regimen of TRESIBA 26 units daily, Victoza 1.8 mg, Invokana 300 mg ,  metformin 2 g and low-dose Amaryl   See history of present illness for detailed discussion of current diabetes management, blood sugar patterns and problems identified  Blood sugars are improving and A1c is excellent now with continuing basal insulin that was started in June  She has gained 2 pounds but discussed that this is related to improved  blood sugar control mostly  HYPERTENSION: Blood pressure is better with lisinopril   PLAN:   She will continue the same dose of [place] everyday and not adjusted based on her nighttime readings Discussed differences between basal and bolus insulins She does need to check blood sugars consistently in the morning at least 2 or 3 times a week to help adjust her dose and discussed blood sugar targets in the morning Since she has difficulty with weight loss and may benefit from a more effective GLP-1 drug will give her a trial of Ozempic instead of Victoza Showed her how this would be done and explained how the dosage regimen would be on the new injection device She can try to use 0.25 for the first 2 injection and then 0.5, may consider 1.0 on the next visit if needed She thinks she can try to find time to start walking again  HYPERTENSION: She will continue on 10 mg of lisinopril and periodically  check her blood pressure at home  Occasional dysphagia: She will follow-up with her PCP   NEUROPATHY: She will continue taking gabapentin regularly  Lipids: Needs follow-up levels today  Influenza vaccine given  Patient Instructions  Stay on the same dose of Tresiba 26 units daily  Start regular exercise with walking  Check more readings fasting and only adjusted Tresiba if morning sugars are out of line  Ozempic 0.25 mg weekly when Victoza runs out and after 2 weeks start taking 0.5 mg if no nausea  Blood sugar targets after meals at least under 160 and in the morning and 130     Counseling time on subjects discussed in assessment and plan sections is over 50% of today's 25 minute visit   Kenyetta Wimbish 05/22/2017, 8:41 PM     Note: This office note was prepared with Estate agent. Any transcriptional errors that result from this process are unintentional.   ADDENDUM: LDL is excellent

## 2017-06-07 ENCOUNTER — Other Ambulatory Visit: Payer: Self-pay | Admitting: Endocrinology

## 2017-07-15 ENCOUNTER — Other Ambulatory Visit: Payer: Self-pay

## 2017-07-15 ENCOUNTER — Encounter (HOSPITAL_COMMUNITY): Payer: Self-pay | Admitting: Emergency Medicine

## 2017-07-15 ENCOUNTER — Emergency Department (HOSPITAL_COMMUNITY): Payer: BLUE CROSS/BLUE SHIELD

## 2017-07-15 DIAGNOSIS — I129 Hypertensive chronic kidney disease with stage 1 through stage 4 chronic kidney disease, or unspecified chronic kidney disease: Secondary | ICD-10-CM | POA: Insufficient documentation

## 2017-07-15 DIAGNOSIS — H53132 Sudden visual loss, left eye: Secondary | ICD-10-CM | POA: Diagnosis not present

## 2017-07-15 DIAGNOSIS — Z794 Long term (current) use of insulin: Secondary | ICD-10-CM | POA: Insufficient documentation

## 2017-07-15 DIAGNOSIS — G4489 Other headache syndrome: Secondary | ICD-10-CM | POA: Diagnosis not present

## 2017-07-15 DIAGNOSIS — Z79899 Other long term (current) drug therapy: Secondary | ICD-10-CM | POA: Insufficient documentation

## 2017-07-15 DIAGNOSIS — E1122 Type 2 diabetes mellitus with diabetic chronic kidney disease: Secondary | ICD-10-CM | POA: Insufficient documentation

## 2017-07-15 DIAGNOSIS — F329 Major depressive disorder, single episode, unspecified: Secondary | ICD-10-CM | POA: Diagnosis not present

## 2017-07-15 DIAGNOSIS — N182 Chronic kidney disease, stage 2 (mild): Secondary | ICD-10-CM | POA: Diagnosis not present

## 2017-07-15 DIAGNOSIS — H538 Other visual disturbances: Secondary | ICD-10-CM | POA: Insufficient documentation

## 2017-07-15 LAB — CBC WITH DIFFERENTIAL/PLATELET
Basophils Absolute: 0 10*3/uL (ref 0.0–0.1)
Basophils Relative: 0 %
EOS PCT: 2 %
Eosinophils Absolute: 0.1 10*3/uL (ref 0.0–0.7)
HEMATOCRIT: 47.4 % — AB (ref 36.0–46.0)
Hemoglobin: 16.4 g/dL — ABNORMAL HIGH (ref 12.0–15.0)
LYMPHS ABS: 2.9 10*3/uL (ref 0.7–4.0)
LYMPHS PCT: 32 %
MCH: 31.4 pg (ref 26.0–34.0)
MCHC: 34.6 g/dL (ref 30.0–36.0)
MCV: 90.6 fL (ref 78.0–100.0)
Monocytes Absolute: 0.6 10*3/uL (ref 0.1–1.0)
Monocytes Relative: 6 %
Neutro Abs: 5.6 10*3/uL (ref 1.7–7.7)
Neutrophils Relative %: 60 %
PLATELETS: 261 10*3/uL (ref 150–400)
RBC: 5.23 MIL/uL — AB (ref 3.87–5.11)
RDW: 13.4 % (ref 11.5–15.5)
WBC: 9.3 10*3/uL (ref 4.0–10.5)

## 2017-07-15 LAB — BASIC METABOLIC PANEL
Anion gap: 10 (ref 5–15)
BUN: 13 mg/dL (ref 6–20)
CHLORIDE: 103 mmol/L (ref 101–111)
CO2: 24 mmol/L (ref 22–32)
Calcium: 9.5 mg/dL (ref 8.9–10.3)
Creatinine, Ser: 0.9 mg/dL (ref 0.44–1.00)
GFR calc Af Amer: >60 mL/min (ref >60)
GFR calc non Af Amer: >60 mL/min (ref >60)
GLUCOSE: 129 mg/dL — AB (ref 65–99)
POTASSIUM: 3.6 mmol/L (ref 3.5–5.1)
Sodium: 137 mmol/L (ref 135–145)

## 2017-07-15 MED ORDER — GADOBENATE DIMEGLUMINE 529 MG/ML IV SOLN
20.0000 mL | Freq: Once | INTRAVENOUS | Status: AC
  Administered 2017-07-15: 20 mL via INTRAVENOUS

## 2017-07-15 NOTE — ED Notes (Signed)
Dr. Jeanell Sparrow made aware of pts c/o and need for MRI order per her eye doctor's request.

## 2017-07-15 NOTE — ED Notes (Signed)
Phlebotomist notified to collect blood specimen.

## 2017-07-15 NOTE — ED Triage Notes (Signed)
Pt reports seeing bright lights and spots x2 weeks, saw eye doctor today who sent her to specialist, reports she had a scan that showed hemorrhaging behind her eyes. Pt was sent over for follow up MRI. Pt a/ox4, resp e/u, nad.

## 2017-07-16 ENCOUNTER — Emergency Department (HOSPITAL_COMMUNITY)
Admission: EM | Admit: 2017-07-16 | Discharge: 2017-07-16 | Disposition: A | Discharge status: Home / Self Care | Payer: BLUE CROSS/BLUE SHIELD | Attending: Emergency Medicine | Admitting: Emergency Medicine

## 2017-07-16 ENCOUNTER — Encounter (HOSPITAL_COMMUNITY): Payer: Self-pay | Admitting: *Deleted

## 2017-07-16 ENCOUNTER — Other Ambulatory Visit: Payer: Self-pay

## 2017-07-16 ENCOUNTER — Emergency Department (HOSPITAL_COMMUNITY)
Admission: EM | Admit: 2017-07-16 | Discharge: 2017-07-17 | Disposition: A | Discharge status: Home / Self Care | Payer: BLUE CROSS/BLUE SHIELD | Source: Home / Self Care | Attending: Emergency Medicine | Admitting: Emergency Medicine

## 2017-07-16 DIAGNOSIS — R519 Headache, unspecified: Secondary | ICD-10-CM

## 2017-07-16 DIAGNOSIS — R51 Headache: Principal | ICD-10-CM

## 2017-07-16 DIAGNOSIS — Z79899 Other long term (current) drug therapy: Secondary | ICD-10-CM | POA: Insufficient documentation

## 2017-07-16 DIAGNOSIS — E1140 Type 2 diabetes mellitus with diabetic neuropathy, unspecified: Secondary | ICD-10-CM

## 2017-07-16 DIAGNOSIS — R531 Weakness: Secondary | ICD-10-CM | POA: Insufficient documentation

## 2017-07-16 DIAGNOSIS — Z794 Long term (current) use of insulin: Secondary | ICD-10-CM | POA: Insufficient documentation

## 2017-07-16 DIAGNOSIS — E1122 Type 2 diabetes mellitus with diabetic chronic kidney disease: Secondary | ICD-10-CM

## 2017-07-16 DIAGNOSIS — I129 Hypertensive chronic kidney disease with stage 1 through stage 4 chronic kidney disease, or unspecified chronic kidney disease: Secondary | ICD-10-CM

## 2017-07-16 DIAGNOSIS — N182 Chronic kidney disease, stage 2 (mild): Secondary | ICD-10-CM

## 2017-07-16 DIAGNOSIS — G4489 Other headache syndrome: Secondary | ICD-10-CM

## 2017-07-16 DIAGNOSIS — H539 Unspecified visual disturbance: Secondary | ICD-10-CM

## 2017-07-16 NOTE — ED Notes (Signed)
ED Provider at bedside. 

## 2017-07-16 NOTE — ED Notes (Signed)
[name], [place] informed of MRI notifying this RN of being unable to perform MRI brian for 24-48 hours due to MRI orbits being done prior with contrast.

## 2017-07-16 NOTE — ED Triage Notes (Signed)
The pt is c/o a headache since yesterday  She saw her eye doctor that told her she had bleeding arouond the retina  And she was sent for a mri  She reports no abnormalty on that  She still has a headache seeing spots and bi-lateral leg weakness

## 2017-07-16 NOTE — Discharge Instructions (Signed)
Return here as needed. You will need to follow up with your doctor for a recheck.

## 2017-07-17 MED ORDER — PROCHLORPERAZINE EDISYLATE 5 MG/ML IJ SOLN
10.0000 mg | Freq: Once | INTRAMUSCULAR | Status: AC
  Administered 2017-07-17: 10 mg via INTRAVENOUS
  Filled 2017-07-17: qty 2

## 2017-07-17 MED ORDER — SODIUM CHLORIDE 0.9 % IV BOLUS (SEPSIS)
1000.0000 mL | Freq: Once | INTRAVENOUS | Status: AC
  Administered 2017-07-17: 1000 mL via INTRAVENOUS

## 2017-07-17 MED ORDER — HYDROCODONE-ACETAMINOPHEN 5-325 MG PO TABS: 1.0000 | ORAL_TABLET | Freq: Four times a day (QID) | ORAL | 0 refills | Status: DC | PRN

## 2017-07-17 MED ORDER — KETOROLAC TROMETHAMINE 30 MG/ML IJ SOLN
30.0000 mg | Freq: Once | INTRAMUSCULAR | Status: AC
  Administered 2017-07-17: 30 mg via INTRAVENOUS
  Filled 2017-07-17: qty 1

## 2017-07-17 MED ORDER — MORPHINE SULFATE (PF) 4 MG/ML IV SOLN
4.0000 mg | Freq: Once | INTRAVENOUS | Status: AC
  Administered 2017-07-17: 4 mg via INTRAVENOUS
  Filled 2017-07-17: qty 1

## 2017-07-17 NOTE — ED Provider Notes (Signed)
[facility] EMERGENCY DEPARTMENT Provider Note   CSN: [id number] Arrival date & time: 07/15/17  1657     History   Chief Complaint Chief Complaint  Patient presents with  . Eye Problem    HPI [name] is a 62 y.o. female.  HPI Patient presents to the emergency department with her ophthalmologist.  There is concerned she may have a vitreous hemorrhage.  She has been having headaches with visual changes in the left eye over the last month.  Patient states that she feels like she is also having some ambulatory issues.  The patient states that she was sent here for MRI.  The patient denies chest pain, shortness of breath,  neck pain, fever, cough, weakness, numbness, dizziness, anorexia, edema, abdominal pain, nausea, vomiting, diarrhea, rash, back pain, dysuria, hematemesis, bloody stool, near syncope, or syncope. Past Medical History:  Diagnosis Date  . Colon polyps   . Depression 05/10/2013  . Diabetes mellitus without complication ([place])   . Diverticulosis   . Hypertension   . Irritable bowel   . 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 Neuropathy   . Neuropathy associated with endocrine disorder ([place])   . Obesity (BMI 30-39.9)   . Osteopenia     Patient Active Problem List   Diagnosis Date Noted  . Polyp of colon, adenomatous "precancerous" 07/13/2015  . Annual physical exam 07/13/2015  . Encounter for gynecological examination with Papanicolaou smear of cervix 07/13/2015  . Near syncope 05/11/2013  . Bradycardia, drug induced 05/11/2013  . Hypotension 05/11/2013  . CKD (chronic kidney disease) stage 2, GFR 60-89 ml/min 05/11/2013  . Chest pain at rest 05/10/2013  . Hypertension 05/10/2013  . Depression 05/10/2013  . Type 2 diabetes mellitus with diabetic neuropathy ([place])   . Obesity (BMI 30-39.9) has lost 100lb in 6 yr since dx DM 2     Past Surgical History:  Procedure Laterality Date  . arm surgery Right   . plantar  faci      OB History    No data available       Home Medications    Prior to Admission medications   Medication Sig Start Date End Date Taking? Authorizing Provider  acetaminophen (TYLENOL) 650 MG CR tablet Take 650-1,300 mg by mouth every 8 (eight) hours as needed (for pain or headaches).    Yes [provider]  atorvastatin (LIPITOR) 20 MG tablet Take 1 tablet (20 mg total) by mouth daily. 05/12/13  Yes Samella Parr, NP  gabapentin (NEURONTIN) 600 MG tablet TAKE 1 TABLET 3 TIMES PER DAY AS NEEDED Patient taking differently: Take 600 mg by mouth three times a day 07/23/16  Yes Elayne Snare, MD  insulin degludec (TRESIBA FLEXTOUCH) 100 UNIT/ML SOPN FlexTouch Pen 26 units once daily Patient taking differently: Inject 26 Units into the skin daily after breakfast.  05/12/17  Yes Elayne Snare, MD  INVOKANA 300 MG TABS tablet TAKE 1 TABLET (300 MG TOTAL) BY MOUTH DAILY BEFORE BREAKFAST. 06/09/17  Yes Elayne Snare, MD  lisinopril (PRINIVIL,ZESTRIL) 10 MG tablet TAKE 1 TABLET BY MOUTH EVERY DAY Patient taking differently: Take 10 mg by mouth once a day 05/02/17  Yes Elayne Snare, MD  metFORMIN (GLUCOPHAGE-XR) 500 MG 24 hr tablet TAKE 4 TABLETS (2,000 MG TOTAL) BY MOUTH EVERY EVENING. Patient taking differently: Take 1,000 mg by mouth two times a day 06/09/17  Yes Elayne Snare, MD  nitroGLYCERIN (NITROSTAT) 0.4 MG SL tablet Place 1  tablet (0.4 mg total) under the tongue every 5 (five) minutes as needed for chest pain. 05/12/13  Yes Samella Parr, NP  Semaglutide (OZEMPIC) 0.25 or 0.5 MG/DOSE SOPN Inject 0.5 mg once a week into the skin. Patient taking differently: Inject 0.5 mg into the skin every Monday.  05/22/17  Yes Elayne Snare, MD  Venlafaxine HCl 225 MG TB24 Take 225 mg by mouth daily.   Yes [provider]  aspirin EC 81 MG EC tablet Take 1 tablet (81 mg total) by mouth daily. Patient not taking: Reported on 07/15/2017 05/12/13   Samella Parr, NP  BAYER CONTOUR NEXT  TEST test strip USE AS INSTRUCTED TO CHECK BLOOD SUGAR 2 TIMES PER DAY DX CODE E11.49 09/12/16   Elayne Snare, MD  BAYER MICROLET LANCETS lancets Use as instructed to check blood sugar 2 times per day dx code E11.49 08/11/15   Elayne Snare, MD  Blood Glucose Monitoring Suppl (BAYER CONTOUR NEXT MONITOR) w/Device KIT Use to check blood sugar 2 times per day dx code E11.49 08/11/15   Elayne Snare, MD  fluconazole (DIFLUCAN) 150 MG tablet Take 1 tablet (150 mg total) by mouth once. Patient not taking: Reported on 07/15/2017 08/10/15   Elayne Snare, MD  glimepiride (AMARYL) 1 MG tablet Take 1 tablet (1 mg total) by mouth daily with breakfast. Patient not taking: Reported on 07/15/2017 12/20/16   Elayne Snare, MD  HYDROcodone-acetaminophen (NORCO/VICODIN) 5-325 MG tablet Take 1 tablet by mouth every 6 (six) hours as needed for moderate pain. 07/17/17   Jola Schmidt, MD  Insulin Pen Needle 32G X 4 MM MISC Use one per day to inject victoza 06/22/15   Elayne Snare, MD  VICTOZA 18 MG/3ML SOPN INJECT 1.8 MG SUBCUTANEOUSLY ONCE DAILY Patient not taking: Reported on 07/15/2017 08/20/16   Elayne Snare, MD    Family History Family History  Problem Relation Age of Onset  . Diabetes type II Mother   . Stroke Mother   . Hypertension Mother   . Diabetes Mother   . Heart disease Father   . Hypertension Father   . Diabetes Father   . Heart attack Father   . Heart disease Brother   . Hypertension Brother   . Diabetes Brother   . Kidney disease Brother   . Diabetes Sister   . Asthma Maternal Grandmother   . Hypertension Maternal Grandmother   . Heart attack Maternal Grandmother   . Heart attack Maternal Grandfather   . Heart attack Paternal Grandmother   . Diabetes Paternal Grandfather   . Heart attack Paternal Grandfather   . Diabetes Brother   . Heart attack Brother   . Diabetes Brother   . Diabetes Brother   . Other Brother        Bright's Disease  . Leukemia Other        brother's grandson    Social  History Social History   Tobacco Use  . Smoking status: Never Smoker  . Smokeless tobacco: Never Used  Substance Use Topics  . Alcohol use: No  . Drug use: No     Allergies   Bisoprolol-hydrochlorothiazide; Relafen [nabumetone]; Aspirin; Dulaglutide; Other; and Restasis [cyclosporine]   Review of Systems Review of Systems All other systems negative except as documented in the HPI. All pertinent positives and negatives as reviewed in the HPI.  Physical Exam Updated Vital Signs BP 109/66   Pulse 65   Temp 98.6 F (37 C) (Oral)   Resp 16   Ht  5' 6"  (1.676 m)   Wt 98.9 kg (218 lb)   SpO2 95%   BMI 35.19 kg/m   Physical Exam  Constitutional: She is oriented to person, place, and time. She appears well-developed and well-nourished. No distress.  HENT:  Head: Normocephalic and atraumatic.  Mouth/Throat: Oropharynx is clear and moist.  Eyes: Pupils are equal, round, and reactive to light.  Neck: Normal range of motion. Neck supple.  Cardiovascular: Normal rate, regular rhythm and normal heart sounds. Exam reveals no gallop and no friction rub.  No murmur heard. Pulmonary/Chest: Effort normal and breath sounds normal. No respiratory distress. She has no wheezes.  Abdominal: Soft. Bowel sounds are normal. She exhibits no distension. There is no tenderness.  Neurological: She is alert and oriented to person, place, and time. She exhibits normal muscle tone. Coordination normal.  Skin: Skin is warm and dry. Capillary refill takes less than 2 seconds. No rash noted. No erythema.  Psychiatric: She has a normal mood and affect. Her behavior is normal.  Nursing note and vitals reviewed.    ED Treatments / Results  Labs (all labs ordered are listed, but only abnormal results are displayed) Labs Reviewed  CBC WITH DIFFERENTIAL/PLATELET - Abnormal; Notable for the following components:      Result Value   RBC 5.23 (*)    Hemoglobin 16.4 (*)    HCT 47.4 (*)    All other  components within normal limits  BASIC METABOLIC PANEL - Abnormal; Notable for the following components:   Glucose, Bld 129 (*)    All other components within normal limits    EKG  EKG Interpretation None       Radiology Mr Rosealee Albee Wo Contrast  Result Date: 07/15/2017 CLINICAL DATA:  Abnormal vision in the left eye. Visual loss or uveitis/scleritis. EXAM: MRI OF THE ORBITS WITHOUT AND WITH CONTRAST TECHNIQUE: Multiplanar, multisequence MR imaging of the orbits was performed both before and after the administration of intravenous contrast. CONTRAST:  28m MULTIHANCE GADOBENATE DIMEGLUMINE 529 MG/ML IV SOLN COMPARISON:  None. FINDINGS: The globes are normal bilaterally. There is no abnormality along the retina or sclera. The lenses are located bilaterally. No hemorrhage is present. The optic nerves are within normal limits bilaterally. There is no enhancement or inflammation. The optic disc is unremarkable. The lens is located bilaterally. No significant retro-orbital mass lesion or inflammation is evident. The extraocular muscles are normal. Cavernous sinus is normal bilaterally. The optic chiasm is unremarkable. Optic tracts are within normal limits. The pituitary stalk is midline.  The pituitary gland is normal. The visualized portions the brain demonstrate scattered subcortical T2 hyperintensities bilaterally. There is no associated enhancement. No other mass lesion is present. The brainstem and cerebellum are normal. Paranasal sinuses and mastoid air cells are clear. IMPRESSION: 1. Normal MRI appearance of the orbits. 2. Scattered subcortical T2 hyperintensities bilaterally are greater than expected for age. The finding is nonspecific but can be seen in the setting of chronic microvascular ischemia, a demyelinating process such as multiple sclerosis, vasculitis, complicated migraine headaches, or as the sequelae of a prior infectious or inflammatory process. 3. No acute abnormality within the  visualized portions of the brain. Electronically Signed   By: CSan MorelleM.D.   On: 07/15/2017 22:02    Procedures Procedures (including critical care time)  Medications Ordered in ED Medications  gadobenate dimeglumine (MULTIHANCE) injection 20 mL (20 mLs Intravenous Contrast Given 07/15/17 2145)     Initial Impression / Assessment and Plan /  ED Course  I have reviewed the triage vital signs and the nursing notes.  Pertinent labs & imaging results that were available during my care of the patient were reviewed by me and considered in my medical decision making (see chart for details).     The patient's MRI was ordered from triage and was only ordered as an orbits.  I wanted to do an MRI of her brain but MRI stated that since she had already had contrast they were unable to do the MRI for 48 hours I was concerned at the fact that she did not have an MRI of the brain due to the fact of her symptoms.  The patient does not have any focal deficits on my examination.  There is some concern with her month's worth of symptoms and not having an MRI of her brain at this point.  I did advise her that she will need to follow-up with she has been seeing for the symptoms.  And they will need to order the MRI.  I told her to return to the emergency department for any worsening in her condition.  Final Clinical Impressions(s) / ED Diagnoses   Final diagnoses:  Visual changes  Other headache syndrome    ED Discharge Orders    None       Dalia Heading, PA-C 07/17/17 9622    Pattricia Boss, MD 07/23/17 [phone]

## 2017-07-17 NOTE — ED Provider Notes (Signed)
[facility] EMERGENCY DEPARTMENT Provider Note   CSN: [id number] Arrival date & time: 07/16/17  1742     History   Chief Complaint Chief Complaint  Patient presents with  . Headache    HPI [name] is a 62 y.o. female.  HPI Patient is a 62 year old female who was recently diagnosed and seen and evaluated by her ophthalmologist yesterday for what sounds like vitreous hemorrhage.  She was having abnormal visual changes out of her left eye.  She was seen in the emergency department yesterday after evaluation by her ophthalmologist for MRI of her orbits which demonstrated no significant abnormality.  An MRI of the brain was not performed.  She presents back to the emergency department tonight because of ongoing left-sided headache and generalized weakness.  Denies unilateral arm or leg weakness.  No fevers or chills.  No neck pain.   Past Medical History:  Diagnosis Date  . Colon polyps   . Depression 05/10/2013  . Diabetes mellitus without complication ([place])   . Diverticulosis   . Hypertension   . Irritable bowel   . 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 Neuropathy   . Neuropathy associated with endocrine disorder ([place])   . Obesity (BMI 30-39.9)   . Osteopenia     Patient Active Problem List   Diagnosis Date Noted  . Polyp of colon, adenomatous "precancerous" 07/13/2015  . Annual physical exam 07/13/2015  . Encounter for gynecological examination with Papanicolaou smear of cervix 07/13/2015  . Near syncope 05/11/2013  . Bradycardia, drug induced 05/11/2013  . Hypotension 05/11/2013  . CKD (chronic kidney disease) stage 2, GFR 60-89 ml/min 05/11/2013  . Chest pain at rest 05/10/2013  . Hypertension 05/10/2013  . Depression 05/10/2013  . Type 2 diabetes mellitus with diabetic neuropathy ([place])   . Obesity (BMI 30-39.9) has lost 100lb in 6 yr since dx DM 2     Past Surgical History:  Procedure Laterality Date  . arm surgery  Right   . plantar faci      OB History    No data available       Home Medications    Prior to Admission medications   Medication Sig Start Date End Date Taking? Authorizing Provider  acetaminophen (TYLENOL) 650 MG CR tablet Take 650-1,300 mg by mouth every 8 (eight) hours as needed (for pain or headaches).    Yes [provider]  atorvastatin (LIPITOR) 20 MG tablet Take 1 tablet (20 mg total) by mouth daily. 05/12/13  Yes Samella Parr, NP  gabapentin (NEURONTIN) 600 MG tablet TAKE 1 TABLET 3 TIMES PER DAY AS NEEDED Patient taking differently: Take 600 mg by mouth three times a day 07/23/16  Yes Elayne Snare, MD  insulin degludec (TRESIBA FLEXTOUCH) 100 UNIT/ML SOPN FlexTouch Pen 26 units once daily Patient taking differently: Inject 26 Units into the skin daily after breakfast.  05/12/17  Yes Elayne Snare, MD  INVOKANA 300 MG TABS tablet TAKE 1 TABLET (300 MG TOTAL) BY MOUTH DAILY BEFORE BREAKFAST. 06/09/17  Yes Elayne Snare, MD  lisinopril (PRINIVIL,ZESTRIL) 10 MG tablet TAKE 1 TABLET BY MOUTH EVERY DAY Patient taking differently: Take 10 mg by mouth once a day 05/02/17  Yes Elayne Snare, MD  metFORMIN (GLUCOPHAGE-XR) 500 MG 24 hr tablet TAKE 4 TABLETS (2,000 MG TOTAL) BY MOUTH EVERY EVENING. Patient taking differently: Take 1,000 mg by mouth two times a day 06/09/17  Yes Elayne Snare, MD  nitroGLYCERIN (NITROSTAT)  0.4 MG SL tablet Place 1 tablet (0.4 mg total) under the tongue every 5 (five) minutes as needed for chest pain. 05/12/13  Yes Samella Parr, NP  Semaglutide (OZEMPIC) 0.25 or 0.5 MG/DOSE SOPN Inject 0.5 mg once a week into the skin. Patient taking differently: Inject 0.5 mg into the skin every Monday.  05/22/17  Yes Elayne Snare, MD  Venlafaxine HCl 225 MG TB24 Take 225 mg by mouth daily.   Yes [provider]  aspirin EC 81 MG EC tablet Take 1 tablet (81 mg total) by mouth daily. Patient not taking: Reported on 07/15/2017 05/12/13   Samella Parr, NP    BAYER CONTOUR NEXT TEST test strip USE AS INSTRUCTED TO CHECK BLOOD SUGAR 2 TIMES PER DAY DX CODE E11.49 09/12/16   Elayne Snare, MD  BAYER MICROLET LANCETS lancets Use as instructed to check blood sugar 2 times per day dx code E11.49 08/11/15   Elayne Snare, MD  Blood Glucose Monitoring Suppl (BAYER CONTOUR NEXT MONITOR) w/Device KIT Use to check blood sugar 2 times per day dx code E11.49 08/11/15   Elayne Snare, MD  fluconazole (DIFLUCAN) 150 MG tablet Take 1 tablet (150 mg total) by mouth once. Patient not taking: Reported on 07/15/2017 08/10/15   Elayne Snare, MD  glimepiride (AMARYL) 1 MG tablet Take 1 tablet (1 mg total) by mouth daily with breakfast. Patient not taking: Reported on 07/15/2017 12/20/16   Elayne Snare, MD  HYDROcodone-acetaminophen (NORCO/VICODIN) 5-325 MG tablet Take 1 tablet by mouth every 6 (six) hours as needed for moderate pain. 07/17/17   Jola Schmidt, MD  Insulin Pen Needle 32G X 4 MM MISC Use one per day to inject victoza 06/22/15   Elayne Snare, MD  VICTOZA 18 MG/3ML SOPN INJECT 1.8 MG SUBCUTANEOUSLY ONCE DAILY Patient not taking: Reported on 07/15/2017 08/20/16   Elayne Snare, MD    Family History Family History  Problem Relation Age of Onset  . Diabetes type II Mother   . Stroke Mother   . Hypertension Mother   . Diabetes Mother   . Heart disease Father   . Hypertension Father   . Diabetes Father   . Heart attack Father   . Heart disease Brother   . Hypertension Brother   . Diabetes Brother   . Kidney disease Brother   . Diabetes Sister   . Asthma Maternal Grandmother   . Hypertension Maternal Grandmother   . Heart attack Maternal Grandmother   . Heart attack Maternal Grandfather   . Heart attack Paternal Grandmother   . Diabetes Paternal Grandfather   . Heart attack Paternal Grandfather   . Diabetes Brother   . Heart attack Brother   . Diabetes Brother   . Diabetes Brother   . Other Brother        Bright's Disease  . Leukemia Other        brother's  grandson    Social History Social History   Tobacco Use  . Smoking status: Never Smoker  . Smokeless tobacco: Never Used  Substance Use Topics  . Alcohol use: No  . Drug use: No     Allergies   Bisoprolol-hydrochlorothiazide; Relafen [nabumetone]; Aspirin; Dulaglutide; Other; and Restasis [cyclosporine]   Review of Systems Review of Systems  All other systems reviewed and are negative.    Physical Exam Updated Vital Signs BP 109/60   Pulse 62   Temp 98.2 F (36.8 C) (Oral)   Resp 18   Ht 5' 6"  (1.676  m)   Wt 98.9 kg (218 lb)   SpO2 98%   BMI 35.19 kg/m   Physical Exam  Constitutional: She is oriented to person, place, and time. She appears well-developed and well-nourished. No distress.  HENT:  Head: Normocephalic and atraumatic.  Eyes: EOM are normal. Pupils are equal, round, and reactive to light.  Neck: Normal range of motion.  Cardiovascular: Normal rate, regular rhythm and normal heart sounds.  Pulmonary/Chest: Effort normal and breath sounds normal.  Abdominal: Soft. She exhibits no distension. There is no tenderness.  Musculoskeletal: Normal range of motion.  Neurological: She is alert and oriented to person, place, and time.  5/5 strength in major muscle groups of  bilateral upper and lower extremities. Speech normal. No facial asymetry.   Skin: Skin is warm and dry.  Psychiatric: She has a normal mood and affect. Judgment normal.  Nursing note and vitals reviewed.    ED Treatments / Results  Labs (all labs ordered are listed, but only abnormal results are displayed) Labs Reviewed - No data to display  EKG  EKG Interpretation  Date/Time:  Tuesday July 16 2017 18:13:43 EST Ventricular Rate:  90 PR Interval:  172 QRS Duration: 54 QT Interval:  360 QTC Calculation: 440 R Axis:   72 Text Interpretation:  Normal sinus rhythm Low voltage QRS Confirmed by Dory Horn) on 07/16/2017 11:44:27 PM       Radiology Mr Rosealee Albee Wo  Contrast  Result Date: 07/15/2017 CLINICAL DATA:  Abnormal vision in the left eye. Visual loss or uveitis/scleritis. EXAM: MRI OF THE ORBITS WITHOUT AND WITH CONTRAST TECHNIQUE: Multiplanar, multisequence MR imaging of the orbits was performed both before and after the administration of intravenous contrast. CONTRAST:  35m MULTIHANCE GADOBENATE DIMEGLUMINE 529 MG/ML IV SOLN COMPARISON:  None. FINDINGS: The globes are normal bilaterally. There is no abnormality along the retina or sclera. The lenses are located bilaterally. No hemorrhage is present. The optic nerves are within normal limits bilaterally. There is no enhancement or inflammation. The optic disc is unremarkable. The lens is located bilaterally. No significant retro-orbital mass lesion or inflammation is evident. The extraocular muscles are normal. Cavernous sinus is normal bilaterally. The optic chiasm is unremarkable. Optic tracts are within normal limits. The pituitary stalk is midline.  The pituitary gland is normal. The visualized portions the brain demonstrate scattered subcortical T2 hyperintensities bilaterally. There is no associated enhancement. No other mass lesion is present. The brainstem and cerebellum are normal. Paranasal sinuses and mastoid air cells are clear. IMPRESSION: 1. Normal MRI appearance of the orbits. 2. Scattered subcortical T2 hyperintensities bilaterally are greater than expected for age. The finding is nonspecific but can be seen in the setting of chronic microvascular ischemia, a demyelinating process such as multiple sclerosis, vasculitis, complicated migraine headaches, or as the sequelae of a prior infectious or inflammatory process. 3. No acute abnormality within the visualized portions of the brain. Electronically Signed   By: CSan MorelleM.D.   On: 07/15/2017 22:02    Procedures Procedures (including critical care time)  Medications Ordered in ED Medications  morphine 4 MG/ML injection 4 mg (4 mg  Intravenous Given 07/17/17 0047)  ketorolac (TORADOL) 30 MG/ML injection 30 mg (30 mg Intravenous Given 07/17/17 0048)  prochlorperazine (COMPAZINE) injection 10 mg (10 mg Intravenous Given 07/17/17 0047)  sodium chloride 0.9 % bolus 1,000 mL (1,000 mLs Intravenous New Bag/Given 07/17/17 0046)     Initial Impression / Assessment and Plan / ED Course  I  have reviewed the triage vital signs and the nursing notes.  Pertinent labs & imaging results that were available during my care of the patient were reviewed by me and considered in my medical decision making (see chart for details).     Feels much better after treatment here in the emergency department.  She has an ophthalmology team who is following her closely including a retinal specialist.  Ongoing outpatient follow-up with them.  In regards to the MRI orbits yesterday the areas of brain that were seen were slightly abnormal and she will definitely need outpatient neurology and likely outpatient MRI.  I do not think this needs to be done emergently at this time.  Given her improvement in symptoms here in the emergency department with migraine type medications I do not think she needs an acute head CT.  Doubt mass or bleed.  Nonfocal neurologic examination.  Discharged home in good condition.  Patient understands return to the ER for new or worsening symptoms  Patient understands the importance of ongoing ophthalmology follow-up and close outpatient neurology follow-up.  Final Clinical Impressions(s) / ED Diagnoses   Final diagnoses:  Acute nonintractable headache, unspecified headache type    ED Discharge Orders        Ordered    HYDROcodone-acetaminophen (NORCO/VICODIN) 5-325 MG tablet  Every 6 hours PRN     07/17/17 4699       Jola Schmidt, MD 07/17/17 [phone]

## 2017-07-24 ENCOUNTER — Other Ambulatory Visit: Payer: Self-pay | Admitting: Endocrinology

## 2017-08-22 ENCOUNTER — Ambulatory Visit: Payer: BLUE CROSS/BLUE SHIELD | Admitting: Endocrinology

## 2017-09-09 ENCOUNTER — Ambulatory Visit: Payer: BLUE CROSS/BLUE SHIELD | Admitting: Neurology

## 2017-09-09 ENCOUNTER — Encounter: Payer: Self-pay | Admitting: Neurology

## 2017-09-09 VITALS — BP 137/92 | HR 81 | Ht 66.0 in | Wt 220.5 lb

## 2017-09-09 DIAGNOSIS — R51 Headache: Secondary | ICD-10-CM | POA: Diagnosis not present

## 2017-09-09 DIAGNOSIS — R519 Headache, unspecified: Secondary | ICD-10-CM | POA: Insufficient documentation

## 2017-09-09 MED ORDER — BUTALBITAL-APAP-CAFFEINE 50-325-40 MG PO TABS: 1.0000 | ORAL_TABLET | Freq: Four times a day (QID) | ORAL | 5 refills | Status: AC | PRN

## 2017-09-09 MED ORDER — NORTRIPTYLINE HCL 25 MG PO CAPS: 50.0000 mg | ORAL_CAPSULE | Freq: Every day | ORAL | 11 refills | Status: DC

## 2017-09-09 NOTE — Progress Notes (Signed)
PATIENT: [name] DOB: 07-30-1955  Chief Complaint  Patient presents with  . Headache/vision disturbance    Reports intermittent bright lights and floaters in her left eye.  She has been under the care of [facility].  Says they sent her for a scan at the ED due to fear of a detached retina.  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  She has daily headaches that range from a quick, sharp, shooting pain to hours of throbbing pain.  She sometimes gets relief with Tylenol.  Marland Kitchen PCP    Jamie Sacramento, MD (referred from ED)     HISTORICAL  [name] is a 62 year old female, seen in refer by primary care doctor [name] for evaluation of headaches, visual disturbance, initial evaluation was on September 09, 2017.  Reviewed and summarized the referring note,She has past medical history of insulin-dependent diabetes, hypertension, hyperlipidemia  She complains a lot of stress recently, starting have increased headaches since December 2018, also noticed left eye left temporal peripheral vision field flashing light, was diagnosed with diabetic retinopathy, is under close observation of her ophthalmologist.  She also noticed lot of left eye floater.  She has constant bilateral frontal temporal region headaches, also goes to occipital area, it has been ongoing for 4 months since November 2018, daily basis, sometimes spiked a much more severe sharp pain, with associated light noise sensitivity,  She has depression anxiety, has tried over-the-counter Tylenol, NSAIDs with limited help,   REVIEW OF SYSTEMS: Full 14 system review of systems performed and notable only for weight loss, blurred vision, shortness of breath, cough, wheezing,  ALLERGIES: Allergies  Allergen Reactions  . Bisoprolol-Hydrochlorothiazide Swelling and Other (See Comments)    Face and throat became swollen; wheezing also  . Relafen [Nabumetone] Hives and Shortness Of Breath  . Aspirin Other  (See Comments)    Irritates patient's IBS and Diverticulitis  . Dulaglutide Other (See Comments)    Trulicity: Abdominal pain  . Other Other (See Comments)    Seasonal allergies: Runny nose/eyes and congestion  . Restasis [Cyclosporine] Itching, Rash and Other (See Comments)    Eyes burn and rash develops around the eyes    HOME MEDICATIONS: Current Outpatient Medications  Medication Sig Dispense Refill  . acetaminophen (TYLENOL) 650 MG CR tablet Take 650-1,300 mg by mouth every 8 (eight) hours as needed (for pain or headaches).     Marland Kitchen atorvastatin (LIPITOR) 20 MG tablet Take 1 tablet (20 mg total) by mouth daily. 30 tablet 0  . BAYER MICROLET LANCETS lancets Use as instructed to check blood sugar 2 times per day dx code E11.49 100 each 3  . Blood Glucose Monitoring Suppl (BAYER CONTOUR NEXT MONITOR) w/Device KIT Use to check blood sugar 2 times per day dx code E11.49 1 kit 0  . CONTOUR NEXT TEST test strip TEST 2 TIMES A DAY E11.49 100 each 3  . fluconazole (DIFLUCAN) 150 MG tablet Take 1 tablet (150 mg total) by mouth once. 1 tablet 0  . gabapentin (NEURONTIN) 600 MG tablet TAKE 1 TABLET 3 TIMES PER DAY AS NEEDED (Patient taking differently: Take 600 mg by mouth three times a day) 90 tablet 3  . insulin degludec (TRESIBA FLEXTOUCH) 100 UNIT/ML SOPN FlexTouch Pen 26 units once daily (Patient taking differently: Inject 26 Units into the skin daily after breakfast. ) 15 pen 1  . Insulin Pen Needle 32G X 4 MM MISC Use one per day to inject  victoza 30 each 5  . INVOKANA 300 MG TABS tablet TAKE 1 TABLET (300 MG TOTAL) BY MOUTH DAILY BEFORE BREAKFAST. 30 tablet 3  . lisinopril (PRINIVIL,ZESTRIL) 10 MG tablet TAKE 1 TABLET BY MOUTH EVERY DAY (Patient taking differently: Take 10 mg by mouth once a day) 30 tablet 3  . metFORMIN (GLUCOPHAGE-XR) 500 MG 24 hr tablet TAKE 4 TABLETS (2,000 MG TOTAL) BY MOUTH EVERY EVENING. (Patient taking differently: Take 1,000 mg by mouth two times a day) 120 tablet 3    . nitroGLYCERIN (NITROSTAT) 0.4 MG SL tablet Place 1 tablet (0.4 mg total) under the tongue every 5 (five) minutes as needed for chest pain. 30 tablet 12  . Semaglutide (OZEMPIC) 0.25 or 0.5 MG/DOSE SOPN Inject 0.5 mg once a week into the skin. (Patient taking differently: Inject 0.5 mg into the skin every Monday. ) 1 pen 2  . Venlafaxine HCl 225 MG TB24 Take 225 mg by mouth daily.    Marland Kitchen VICTOZA 18 MG/3ML SOPN INJECT 1.8 MG SUBCUTANEOUSLY ONCE DAILY 9 pen 3   No current facility-administered medications for this visit.     PAST MEDICAL HISTORY: Past Medical History:  Diagnosis Date  . Colon polyps   . Depression 05/10/2013  . Diabetes mellitus without complication ([place])   . 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 Diverticulosis   . Headache   . Hypertension   . Irritable bowel   . 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 Neuropathy   . Neuropathy associated with endocrine disorder ([place])   . Obesity (BMI 30-39.9)   . Osteopenia   . Visual disturbance     PAST SURGICAL HISTORY: Past Surgical History:  Procedure Laterality Date  . arm surgery Right   . plantar faci      FAMILY HISTORY: Family History  Problem Relation Age of Onset  . Diabetes type II Mother   . Stroke Mother   . Hypertension Mother   . Heart disease Father   . Hypertension Father   . Diabetes Father   . Heart attack Father   . Heart disease Brother   . Hypertension Brother   . Diabetes Brother   . Kidney disease Brother   . Diabetes Sister   . Asthma Maternal Grandmother   . Hypertension Maternal Grandmother   . Heart attack Maternal Grandmother   . Heart attack Maternal Grandfather   . Heart attack Paternal Grandmother   . Diabetes Paternal Grandfather   . Heart attack Paternal Grandfather   . Diabetes Brother   . Heart attack Brother   . Diabetes Brother   . Diabetes Brother   . Other Brother        Bright's Disease  . Leukemia Other        brother's grandson    SOCIAL  HISTORY:  Social History   Socioeconomic History  . Marital status: Married    Spouse name: Not on file  . Number of children: 3  . Years of education: 47  . Highest education level: High school graduate  Social Needs  . Financial resource strain: Not on file  . Food insecurity - worry: Not on file  . Food insecurity - inability: Not on file  . Transportation needs - medical: Not on file  . Transportation needs - non-medical: Not on file  Occupational History  . Occupation: self-employed - sews  Tobacco Use  . Smoking status: Never Smoker  . Smokeless tobacco: Never Used  Substance and  Sexual Activity  . Alcohol use: No  . Drug use: No  . Sexual activity: Yes    Birth control/protection: Post-menopausal  Other Topics Concern  . 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 Married for 38 years. Husband is a Therapist, music.   Right-handed.   2 cups caffeine per day.     PHYSICAL EXAM   Vitals:   09/09/17 1322  BP: (!) 137/92  Pulse: 81  Weight: 220 lb 8 oz (100 kg)  Height: 5' 6" (1.676 m)    Not recorded      Body mass index is 35.59 kg/m.  PHYSICAL EXAMNIATION:  Gen: NAD, conversant, well nourised, obese, well groomed                     Cardiovascular: Regular rate rhythm, no peripheral edema, warm, nontender. Eyes: Conjunctivae clear without exudates or hemorrhage Neck: Supple, no carotid bruits. Pulmonary: Clear to auscultation bilaterally   NEUROLOGICAL EXAM:  MENTAL STATUS: Speech:    Speech is normal; fluent and spontaneous with normal comprehension.  Cognition:     Orientation to time, place and person     Normal recent and remote memory     Normal Attention span and concentration     Normal Language, naming, repeating,spontaneous speech     Fund of knowledge   CRANIAL NERVES: CN II: Visual fields are full to confrontation. Fundoscopic exam is normal with sharp discs and no vascular changes.  Pupils are round equal and briskly reactive to light. CN III, IV, VI: extraocular movement are normal. No ptosis. CN V: Facial sensation is intact to pinprick in all 3 divisions bilaterally. Corneal responses are intact.  CN VII: Face is symmetric with normal eye closure and smile. CN VIII: Hearing is normal to rubbing fingers CN IX, X: Palate elevates symmetrically. Phonation is normal. CN XI: Head turning and shoulder shrug are intact CN XII: Tongue is midline with normal movements and no atrophy.  MOTOR: There is no pronator drift of out-stretched arms. Muscle bulk and tone are normal. Muscle strength is normal.  REFLEXES: Reflexes are 2+ and symmetric at the biceps, triceps, knees, and ankles. Plantar responses are flexor.  SENSORY: Intact to light touch, pinprick, positional sensation and vibratory sensation are intact in fingers and toes.  COORDINATION: Rapid alternating movements and fine finger movements are intact. There is no dysmetria on finger-to-nose and heel-knee-shin.    GAIT/STANCE: Posture is normal. Gait is steady with normal steps, base, arm swing, and turning. Heel and toe walking are normal. Tandem gait is normal.  Romberg is absent.   DIAGNOSTIC DATA (LABS, IMAGING, TESTING) - I reviewed patient records, labs, notes, testing and imaging myself where available.   ASSESSMENT AND PLAN  Ulani SHIZUYE RUPERT is a 62 y.o. female    Chronic migraine headaches  Add nortriptyline 41m qhs, titrating to 569mqhs as migraine prevention.  ESR, CRP to rule out temporal arteritis  Fioricet as needed limit the use to less than 2-3 times each week    YiMarcial PacasM.D. Ph.D.  [facility] [address] [id number]: [phone]: [phone]: WiChristain SacramentoMD

## 2017-09-10 LAB — SEDIMENTATION RATE: SED RATE: 9 mm/h (ref 0–40)

## 2017-09-10 LAB — C-REACTIVE PROTEIN: CRP: 1.5 mg/L (ref 0.0–4.9)

## 2017-09-24 ENCOUNTER — Encounter: Payer: Self-pay | Admitting: Endocrinology

## 2017-09-24 ENCOUNTER — Ambulatory Visit (INDEPENDENT_AMBULATORY_CARE_PROVIDER_SITE_OTHER): Payer: BLUE CROSS/BLUE SHIELD | Admitting: Endocrinology

## 2017-09-24 VITALS — BP 122/82 | HR 71 | Resp 16 | Wt 223.0 lb

## 2017-09-24 DIAGNOSIS — E1140 Type 2 diabetes mellitus with diabetic neuropathy, unspecified: Secondary | ICD-10-CM | POA: Diagnosis not present

## 2017-09-24 LAB — MICROALBUMIN / CREATININE URINE RATIO
CREATININE, U: 72.8 mg/dL
Microalb Creat Ratio: 1 mg/g (ref 0.0–30.0)

## 2017-09-24 LAB — POCT GLYCOSYLATED HEMOGLOBIN (HGB A1C): HEMOGLOBIN A1C: 6.6

## 2017-09-24 NOTE — Patient Instructions (Signed)
Check blood sugars on waking up  2/7 days  Also check blood sugars about 2 hours after a meal and do this after different meals by rotation  Recommended blood sugar levels on waking up is 90-130 and about 2 hours after meal is 130-160  Please bring your blood sugar monitor to each visit, thank you  

## 2017-09-24 NOTE — Progress Notes (Signed)
Patient ID: [name], female   DOB: [birth year], 62 y.o.   MRN: [id number]           Reason for Appointment: Follow-up for Type 2 Diabetes  Referring physician: Kathryne Eriksson  History of Present Illness:          Diagnosis: Type 2 diabetes mellitus, date of diagnosis:  2010       Past history:  She was having symptoms of neuropathy at onset At that time she was started on metformin alone, 500 mg twice a day and this was continued unchanged until recently Apparently she was also given Onglyza about 4 years ago but details of her control at that time is not available Her A1c levels have been mild increase over the last year but she thinks her blood sugars had been generally good with only some readings up to 200. A1c was 7.1 in 1/15 and she was continued on metformin and Onglyza When her A1c was 7.9 in August she was told to double her metformin to 2 g daily She  had blood sugars of 200 or more prior to her initial consultation  Her Trulicity was changed to Victoza because of diarrhea and abdominal pain and has been able to tolerate Victoza  1.8 hs  When A1c was 7.6 she was started on Invokana 100 mg in 11/16, this was subsequently increased when her A1c was 7.5 in 10/17 She has been on insulin since 12/2016  Recent history:   INSULIN regimen: Tresiba  26 units daily  Non-insulin hypoglycemic drugs the patient is taking are:  metformin ER 2 g daily, Invokana 300 mg daily, Ozempic 0.5 mg weekly     Her A1c about the same at 6.6, Previously in June 2018 was higher at 7.7   Current blood sugar patterns and problems identified:  She has started walking for exercise in the park, previously had been reluctant to start doing any increased activity  Also on her last visit she was changed from Victoza to [place] for weekly dosage convenience  With this her blood sugars are generally fairly good although she did have some high readings last month when she had respiratory illness  She  checks her blood sugars mostly in the evenings sometime around 9:53 PM usually after supper and some sporadically during the day  She has a couple of readings in the mornings which are only mildly increased  Blood sugars are only randomly higher after breakfast and lunch and mostly when she was having an infection  She has however not been able to lose any weight recently; compared to her last visit her weight is down 4 pounds  No hypoglycemia.     Side effects from medications have been: Diarrhea, abdominal pain with Trulicity Compliance with the medical regimen: Good  Glucose monitoring:  done less than once  a day         Glucometer: Contour       Blood Glucose readings from home monitor:   Mean values apply above for all meters except median for One Touch  PRE-MEAL Fasting Lunch Dinner  evening Overall  Glucose range:  115-148  130-195   104-271   Mean/median:  133   154 149    Self-care: The diet that the patient has been following is: tries to limit carbohydrates .     Meals: 3 meals per day. Breakfast is yogurt/ cereal at 9 am; , lunch is a sandwich, and dinner at 5 pm will have baked  chicken, starch and vegetables. Has snacks with yogurt, cottage cheese, fruit, nuts and  rice cakes            Exercise: walks when able to go out    Dietician visit, most recent: 9/15              Weight history:  Wt Readings from Last 3 Encounters:  09/24/17 223 lb (101.2 kg)  09/09/17 220 lb 8 oz (100 kg)  07/16/17 218 lb (98.9 kg)    Glycemic control:     Lab Results  Component Value Date   HGBA1C 6.6 09/24/2017   HGBA1C 6.5 05/22/2017   HGBA1C 7.7 12/20/2016   Lab Results  Component Value Date   MICROALBUR <0.7 08/08/2016   LDLCALC 84 05/22/2017   CREATININE 0.90 07/15/2017       Allergies as of 09/24/2017      Reactions   Bisoprolol-hydrochlorothiazide Swelling, Other (See Comments)   Face and throat became swollen; wheezing also   Relafen [nabumetone] Hives,  Shortness Of Breath   Aspirin Other (See Comments)   Irritates patient's IBS and Diverticulitis   Dulaglutide Other (See Comments)   Trulicity: Abdominal pain   Other Other (See Comments)   Seasonal allergies: Runny nose/eyes and congestion   Restasis [cyclosporine] Itching, Rash, Other (See Comments)   Eyes burn and rash develops around the eyes      Medication List        Accurate as of 09/24/17 10:26 AM. Always use your most recent med list.          acetaminophen 650 MG CR tablet Commonly known as:  TYLENOL Take 650-1,300 mg by mouth every 8 (eight) hours as needed (for pain or headaches).   albuterol 108 (90 Base) MCG/ACT inhaler Commonly known as:  PROVENTIL HFA;VENTOLIN HFA Inhale into the lungs.   PROAIR HFA 108 (90 Base) MCG/ACT inhaler Generic drug:  albuterol Inhale 2 puffs into the lungs every 6 (six) hours as needed.   atorvastatin 20 MG tablet Commonly known as:  LIPITOR Take 1 tablet (20 mg total) by mouth daily.   BAYER CONTOUR NEXT MONITOR w/Device Kit Use to check blood sugar 2 times per day dx code E11.49   BAYER MICROLET LANCETS lancets Use as instructed to check blood sugar 2 times per day dx code E11.49   butalbital-acetaminophen-caffeine 50-325-40 MG tablet Commonly known as:  FIORICET, ESGIC Take 1 tablet by mouth every 6 (six) hours as needed for headache. Do not refill in less than 30 days.   CONTOUR NEXT TEST test strip Generic drug:  glucose blood TEST 2 TIMES A DAY E11.49   fluconazole 150 MG tablet Commonly known as:  DIFLUCAN Take 1 tablet (150 mg total) by mouth once.   gabapentin 600 MG tablet Commonly known as:  NEURONTIN TAKE 1 TABLET 3 TIMES PER DAY AS NEEDED   insulin degludec 100 UNIT/ML Sopn FlexTouch Pen Commonly known as:  TRESIBA FLEXTOUCH 26 units once daily   Insulin Pen Needle 32G X 4 MM Misc Use one per day to inject victoza   INVOKANA 300 MG Tabs tablet Generic drug:  canagliflozin TAKE 1 TABLET (300 MG  TOTAL) BY MOUTH DAILY BEFORE BREAKFAST.   lisinopril 10 MG tablet Commonly known as:  PRINIVIL,ZESTRIL TAKE 1 TABLET BY MOUTH EVERY DAY   metFORMIN 500 MG 24 hr tablet Commonly known as:  GLUCOPHAGE-XR TAKE 4 TABLETS (2,000 MG TOTAL) BY MOUTH EVERY EVENING.   nitroGLYCERIN 0.4 MG SL tablet Commonly known  as:  NITROSTAT Place 1 tablet (0.4 mg total) under the tongue every 5 (five) minutes as needed for chest pain.   nortriptyline 25 MG capsule Commonly known as:  PAMELOR Take 2 capsules (50 mg total) by mouth at bedtime.   Semaglutide 0.25 or 0.5 MG/DOSE Sopn Commonly known as:  OZEMPIC Inject 0.5 mg once a week into the skin.   Venlafaxine HCl 225 MG Tb24 Take 225 mg by mouth daily.   VICTOZA 18 MG/3ML Sopn Generic drug:  liraglutide INJECT 1.8 MG SUBCUTANEOUSLY ONCE DAILY       Allergies:  Allergies  Allergen Reactions  . Bisoprolol-Hydrochlorothiazide Swelling and Other (See Comments)    Face and throat became swollen; wheezing also  . Relafen [Nabumetone] Hives and Shortness Of Breath  . Aspirin Other (See Comments)    Irritates patient's IBS and Diverticulitis  . Dulaglutide Other (See Comments)    Trulicity: Abdominal pain  . Other Other (See Comments)    Seasonal allergies: Runny nose/eyes and congestion  . Restasis [Cyclosporine] Itching, Rash and Other (See Comments)    Eyes burn and rash develops around the eyes    Past Medical History:  Diagnosis Date  . Colon polyps   . Depression 05/10/2013  . Diabetes mellitus without complication ([place])   . 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 Diverticulosis   . Headache   . Hypertension   . Irritable bowel   . 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 Neuropathy   . Neuropathy associated with endocrine disorder ([place])   . Obesity (BMI 30-39.9)   . Osteopenia   . Visual disturbance     Past Surgical History:  Procedure Laterality Date  . arm surgery Right   . plantar faci      Family  History  Problem Relation Age of Onset  . Diabetes type II Mother   . Stroke Mother   . Hypertension Mother   . Heart disease Father   . Hypertension Father   . Diabetes Father   . Heart attack Father   . Heart disease Brother   . Hypertension Brother   . Diabetes Brother   . Kidney disease Brother   . Diabetes Sister   . Asthma Maternal Grandmother   . Hypertension Maternal Grandmother   . Heart attack Maternal Grandmother   . Heart attack Maternal Grandfather   . Heart attack Paternal Grandmother   . Diabetes Paternal Grandfather   . Heart attack Paternal Grandfather   . Diabetes Brother   . Heart attack Brother   . Diabetes Brother   . Diabetes Brother   . Other Brother        Bright's Disease  . Leukemia Other        brother's grandson    Social History:  reports that  has never smoked. she has never used smokeless tobacco. She reports that she does not drink alcohol or use drugs.    Review of Systems    Most recent eye exam was in March, having some issues with retinopathy or optic nerve problems causing visual difficulties but no records are available, due to follow-up next week       Lipids: These have been controlled with Lipitor 20 mg daily as of 11/18       Lab Results  Component Value Date   CHOL 169 05/22/2017   HDL 66.40 05/22/2017   LDLCALC 84 05/22/2017   TRIG 94.0 05/22/2017   CHOLHDL 3 05/22/2017  The blood pressure has been high for several years treated by PCP  Blood pressure is excellent now with taking 10 mg lisinopril     BP Readings from Last 3 Encounters:  09/24/17 122/82  09/09/17 (!) 137/92  07/17/17 114/61        She has a long history of Numbness, pain, tingling or burning in feet    Symptoms relieved with  gabapentin to 600 mg 3 times a day Taking tramadol as needed also    Foot exam in 7/18 showed decreased  Physical Examination:  BP 122/82 (BP Location: Left Arm, Patient Position: Sitting, Cuff  Size: Normal)   Pulse 71   Resp 16   Wt 223 lb (101.2 kg)   SpO2 98%   BMI 35.99 kg/m    ASSESSMENT:  Diabetes type 2, insulin requiring with obesity  She is on a regimen of TRESIBA 26 units daily, Ozempic 0.5 Invokana 300 mg ,  metformin 2 g and low-dose Amaryl   See history of present illness for detailed discussion of current diabetes management, blood sugar patterns and problems identified  Blood sugars are fairly consistently controlled with A1c 6.6, similar to her last visit Although she has had some inconsistencies due to intercurrent illnesses she seems to be increasingly having better blood sugars including postprandial Control appears to be similar with Ozempic compared to Victoza so far However not checking fasting readings enough She is also motivated to exercise now   HYPERTENSION: Blood pressure is controlled with lisinopril   PLAN:   She will continue the same dose of [place] unless her fasting readings are out of the desired range of 90-130 No change in Ozempic as yet Continue efforts to lose weight with more frequent walking and consistent diet She will need to check her blood sugar at a variety of times including fasting  HYPERTENSION: She will continue on 10 mg of lisinopril To monitor at home also Urine microalbumin today   Follow-up in 4 months   There are no Patient Instructions on file for this visit.       Elayne Snare 09/24/2017, 10:26 AM     Note: This office note was prepared with Dragon voice recognition system technology. Any transcriptional errors that result from this process are unintentional.

## 2017-09-27 ENCOUNTER — Other Ambulatory Visit: Payer: Self-pay | Admitting: Endocrinology

## 2017-09-28 ENCOUNTER — Other Ambulatory Visit: Payer: Self-pay | Admitting: Endocrinology

## 2017-10-10 ENCOUNTER — Other Ambulatory Visit: Payer: Self-pay | Admitting: Endocrinology

## 2017-11-12 ENCOUNTER — Ambulatory Visit: Payer: BLUE CROSS/BLUE SHIELD | Admitting: Neurology

## 2017-11-12 ENCOUNTER — Encounter: Payer: Self-pay | Admitting: Neurology

## 2017-11-12 VITALS — BP 128/82 | HR 66 | Ht 66.0 in | Wt 233.0 lb

## 2017-11-12 DIAGNOSIS — G43709 Chronic migraine without aura, not intractable, without status migrainosus: Secondary | ICD-10-CM | POA: Insufficient documentation

## 2017-11-12 DIAGNOSIS — IMO0002 Reserved for concepts with insufficient information to code with codable children: Secondary | ICD-10-CM | POA: Insufficient documentation

## 2017-11-12 MED ORDER — NORTRIPTYLINE HCL 25 MG PO CAPS: 50.0000 mg | ORAL_CAPSULE | Freq: Every day | ORAL | 4 refills | Status: DC

## 2017-11-12 NOTE — Progress Notes (Signed)
PATIENT: [name] DOB: March 10, 1956  Chief Complaint  Patient presents with  . Headache    Reports improvement in headache frequency since being on nortriptyline 30m at bedtime.  The Fioricet works well to resolve her more severe pain.     HISTORICAL  [name] a 62year old female, seen in refer by primary care doctor Jamie Erikssonfor evaluation of headaches, visual disturbance, initial evaluation was on September 09, 2017.  Reviewed and summarized the referring note,She has past medical history of insulin-dependent diabetes, hypertension, hyperlipidemia  She complains a lot of stress recently, starting have increased headaches since December 2018, also noticed left eye left temporal peripheral vision field flashing light, was diagnosed with diabetic retinopathy, is under close observation of her ophthalmologist.  She also noticed lot of left eye floater.  She has constant bilateral frontal temporal region headaches, also goes to occipital area, it has been ongoing for 4 months since November 2018, daily basis, sometimes spiked a much more severe sharp pain, with associated light noise sensitivity,  She has depression anxiety, has tried over-the-counter Tylenol, NSAIDs with limited help  UPDATE November 12 2017: She is able to tolerate nortriptyline 25 mg every night, able to sleep better, no significant side effect, also helped her depression anxiety, Fioricet as needed was helpful to abort the headaches,  Lab showed normal ESR, CRP.  REVIEW OF SYSTEMS: Full 14 system review of systems performed and notable only for light sensitivity, eye pain, blurred vision, constipation, diarrhea, restless leg, joint pain, back pain, headaches, depression anxiety  ALLERGIES: Allergies  Allergen Reactions  . Bisoprolol-Hydrochlorothiazide Swelling and Other (See Comments)    Face and throat became swollen; wheezing also  . Relafen [Nabumetone] Hives and Shortness Of Breath  .  Aspirin Other (See Comments)    Irritates patient's IBS and Diverticulitis  . Dulaglutide Other (See Comments)    Trulicity: Abdominal pain  . Other Other (See Comments)    Seasonal allergies: Runny nose/eyes and congestion  . Restasis [Cyclosporine] Itching, Rash and Other (See Comments)    Eyes burn and rash develops around the eyes    HOME MEDICATIONS: Current Outpatient Medications  Medication Sig Dispense Refill  . acetaminophen (TYLENOL) 650 MG CR tablet Take 650-1,300 mg by mouth every 8 (eight) hours as needed (for pain or headaches).     .Marland Kitchenalbuterol (PROVENTIL HFA;VENTOLIN HFA) 108 (90 Base) MCG/ACT inhaler Inhale into the lungs.    .Marland Kitchenatorvastatin (LIPITOR) 20 MG tablet Take 1 tablet (20 mg total) by mouth daily. 30 tablet 0  . BAYER MICROLET LANCETS lancets Use as instructed to check blood sugar 2 times per day dx code E11.49 100 each 3  . Blood Glucose Monitoring Suppl (BAYER CONTOUR NEXT MONITOR) w/Device KIT Use to check blood sugar 2 times per day dx code E11.49 1 kit 0  . butalbital-acetaminophen-caffeine (FIORICET, ESGIC) 50-325-40 MG tablet Take 1 tablet by mouth every 6 (six) hours as needed for headache. Do not refill in less than 30 days. 15 tablet 5  . CONTOUR NEXT TEST test strip TEST 2 TIMES A DAY E11.49 100 each 3  . fluconazole (DIFLUCAN) 150 MG tablet Take 1 tablet (150 mg total) by mouth once. 1 tablet 0  . gabapentin (NEURONTIN) 600 MG tablet TAKE 1 TABLET 3 TIMES PER DAY AS NEEDED (Patient taking differently: Take 600 mg by mouth three times a day) 90 tablet 3  . insulin degludec (TRESIBA FLEXTOUCH) 100 UNIT/ML SOPN FlexTouch Pen 26  units once daily (Patient taking differently: Inject 26 Units into the skin daily after breakfast. ) 15 pen 1  . Insulin Pen Needle 32G X 4 MM MISC Use one per day to inject victoza 30 each 5  . INVOKANA 300 MG TABS tablet TAKE 1 TABLET (300 MG TOTAL) BY MOUTH DAILY BEFORE BREAKFAST. 30 tablet 3  . lisinopril (PRINIVIL,ZESTRIL) 10 MG  tablet TAKE 1 TABLET BY MOUTH EVERY DAY 30 tablet 3  . metFORMIN (GLUCOPHAGE-XR) 500 MG 24 hr tablet TAKE 4 TABLETS (2,000 MG TOTAL) BY MOUTH EVERY EVENING. (Patient taking differently: Take 1,000 mg by mouth two times a day) 120 tablet 3  . nitroGLYCERIN (NITROSTAT) 0.4 MG SL tablet Place 1 tablet (0.4 mg total) under the tongue every 5 (five) minutes as needed for chest pain. 30 tablet 12  . nortriptyline (PAMELOR) 25 MG capsule Take 2 capsules (50 mg total) by mouth at bedtime. 60 capsule 11  . OZEMPIC 0.25 or 0.5 MG/DOSE SOPN INJECT 0.5 MG ONCE A WEEK INTO THE SKIN. 2 pen 2  . PROAIR HFA 108 (90 Base) MCG/ACT inhaler Inhale 2 puffs into the lungs every 6 (six) hours as needed.  0  . TRESIBA FLEXTOUCH 100 UNIT/ML SOPN FlexTouch Pen INJECT 0.2 ML (20 UNITS) IN SKIN DAILY 15 pen 1  . Venlafaxine HCl 225 MG TB24 Take 225 mg by mouth daily.     No current facility-administered medications for this visit.     PAST MEDICAL HISTORY: Past Medical History:  Diagnosis Date  . Colon polyps   . Depression 05/10/2013  . Diabetes mellitus without complication ([place])   . 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 Diverticulosis   . Headache   . Hypertension   . Irritable bowel   . 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 Neuropathy   . Neuropathy associated with endocrine disorder ([place])   . Obesity (BMI 30-39.9)   . Osteopenia   . Visual disturbance     PAST SURGICAL HISTORY: Past Surgical History:  Procedure Laterality Date  . arm surgery Right   . plantar faci      FAMILY HISTORY: Family History  Problem Relation Age of Onset  . Diabetes type II Mother   . Stroke Mother   . Hypertension Mother   . Heart disease Father   . Hypertension Father   . Diabetes Father   . Heart attack Father   . Heart disease Brother   . Hypertension Brother   . Diabetes Brother   . Kidney disease Brother   . Diabetes Sister   . Asthma Maternal Grandmother   . Hypertension Maternal  Grandmother   . Heart attack Maternal Grandmother   . Heart attack Maternal Grandfather   . Heart attack Paternal Grandmother   . Diabetes Paternal Grandfather   . Heart attack Paternal Grandfather   . Diabetes Brother   . Heart attack Brother   . Diabetes Brother   . Diabetes Brother   . Other Brother        Bright's Disease  . Leukemia Other        brother's grandson    SOCIAL HISTORY:  Social History   Socioeconomic History  . Marital status: Married    Spouse name: Not on file  . Number of children: 3  . Years of education: 64  . Highest education level: High school graduate  Occupational History  . Occupation: self-employed - sews  Social Needs  . Emergency planning/management officer  strain: Not on file  . Food insecurity:    Worry: Not on file    Inability: Not on file  . Transportation needs:    Medical: Not on file    Non-medical: Not on file  Tobacco Use  . Smoking status: Never Smoker  . Smokeless tobacco: Never Used  Substance and Sexual Activity  . Alcohol use: No  . Drug use: No  . Sexual activity: Yes    Birth control/protection: Post-menopausal  Lifestyle  . Physical activity:    Days per week: Not on file    Minutes per session: Not on file  . Stress: Not on file  Relationships  . Social connections:    Talks on phone: Not on file    Gets together: Not on file    Attends religious service: Not on file    Active member of club or organization: Not on file    Attends meetings of clubs or organizations: Not on file    Relationship status: Not on file  . Intimate partner violence:    Fear of current or ex partner: Not on file    Emotionally abused: Not on file    Physically abused: Not on file    Forced sexual activity: Not on file  Other Topics Concern  . 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 Married for 38 years. Husband is a Therapist, music.   Right-handed.   2 cups caffeine per day.     PHYSICAL  EXAM   Vitals:   11/12/17 1411  BP: 128/82  Pulse: 66  Weight: 233 lb (105.7 kg)  Height: 5' 6"  (1.676 m)    Not recorded      Body mass index is 37.61 kg/m.  PHYSICAL EXAMNIATION:  Gen: NAD, conversant, well nourised, obese, well groomed                     Cardiovascular: Regular rate rhythm, no peripheral edema, warm, nontender. Eyes: Conjunctivae clear without exudates or hemorrhage Neck: Supple, no carotid bruits. Pulmonary: Clear to auscultation bilaterally   NEUROLOGICAL EXAM:  MENTAL STATUS: Speech:    Speech is normal; fluent and spontaneous with normal comprehension.  Cognition:     Orientation to time, place and person     Normal recent and remote memory     Normal Attention span and concentration     Normal Language, naming, repeating,spontaneous speech     Fund of knowledge   CRANIAL NERVES: CN II: Visual fields are full to confrontation. Fundoscopic exam is normal with sharp discs and no vascular changes. Pupils are round equal and briskly reactive to light. CN III, IV, VI: extraocular movement are normal. No ptosis. CN V: Facial sensation is intact to pinprick in all 3 divisions bilaterally. Corneal responses are intact.  CN VII: Face is symmetric with normal eye closure and smile. CN VIII: Hearing is normal to rubbing fingers CN IX, X: Palate elevates symmetrically. Phonation is normal. CN XI: Head turning and shoulder shrug are intact CN XII: Tongue is midline with normal movements and no atrophy.  MOTOR: There is no pronator drift of out-stretched arms. Muscle bulk and tone are normal. Muscle strength is normal.  REFLEXES: Reflexes are 2+ and symmetric at the biceps, triceps, knees, and ankles. Plantar responses are flexor.  SENSORY: Intact to light touch, pinprick, positional sensation and vibratory sensation are intact in fingers and toes.  COORDINATION: Rapid alternating movements  and fine finger movements are intact. There is no dysmetria  on finger-to-nose and heel-knee-shin.    GAIT/STANCE: Posture is normal. Gait is steady with normal steps, base, arm swing, and turning. Heel and toe walking are normal. Tandem gait is normal.  Romberg is absent.   DIAGNOSTIC DATA (LABS, IMAGING, TESTING) - I reviewed patient records, labs, notes, testing and imaging myself where available.   ASSESSMENT AND PLAN  [name] is a 62 y.o. female    Chronic migraine headaches  Keep nortriptyline 32m qhs, 2 tablets every night as migraine prevention.  Has been very helpful  ESR, CRP was normal  Fioricet as needed limit the use to less than 2-3 times each week    YMarcial Pacas M.D. Ph.D.  [facility] [address] [place] [place] [address]Ph: [phone]: [phone] CC: WChristain Sacramento MD

## 2017-11-23 ENCOUNTER — Other Ambulatory Visit: Payer: Self-pay | Admitting: Endocrinology

## 2017-12-05 ENCOUNTER — Other Ambulatory Visit: Payer: Self-pay | Admitting: Family Medicine

## 2017-12-05 ENCOUNTER — Other Ambulatory Visit: Payer: Self-pay | Admitting: Endocrinology

## 2017-12-05 DIAGNOSIS — H349 Unspecified retinal vascular occlusion: Secondary | ICD-10-CM

## 2017-12-10 ENCOUNTER — Ambulatory Visit
Admission: RE | Admit: 2017-12-10 | Discharge: 2017-12-10 | Disposition: A | Discharge status: Home / Self Care | Payer: BLUE CROSS/BLUE SHIELD | Source: Ambulatory Visit | Attending: Family Medicine | Admitting: Family Medicine

## 2017-12-10 DIAGNOSIS — H349 Unspecified retinal vascular occlusion: Secondary | ICD-10-CM

## 2017-12-25 ENCOUNTER — Encounter: Payer: Self-pay | Admitting: Endocrinology

## 2017-12-25 ENCOUNTER — Ambulatory Visit: Payer: BLUE CROSS/BLUE SHIELD | Admitting: Endocrinology

## 2017-12-25 VITALS — BP 128/80 | HR 80 | Ht 66.0 in | Wt 227.4 lb

## 2017-12-25 DIAGNOSIS — E1140 Type 2 diabetes mellitus with diabetic neuropathy, unspecified: Secondary | ICD-10-CM | POA: Diagnosis not present

## 2017-12-25 LAB — LIPID PANEL
CHOL/HDL RATIO: 3
Cholesterol: 159 mg/dL (ref 0–200)
HDL: 62.4 mg/dL (ref >39.00)
LDL Cholesterol: 75 mg/dL (ref 0–99)
NonHDL: 96.7
Triglycerides: 111 mg/dL (ref 0.0–149.0)
VLDL: 22.2 mg/dL (ref 0.0–40.0)

## 2017-12-25 LAB — COMPREHENSIVE METABOLIC PANEL
ALT: 19 U/L (ref 0–35)
AST: 15 U/L (ref 0–37)
Albumin: 4.2 g/dL (ref 3.5–5.2)
Alkaline Phosphatase: 105 U/L (ref 39–117)
BUN: 14 mg/dL (ref 6–23)
CO2: 27 meq/L (ref 19–32)
Calcium: 9.6 mg/dL (ref 8.4–10.5)
Chloride: 101 mEq/L (ref 96–112)
Creatinine, Ser: 0.9 mg/dL (ref 0.40–1.20)
GFR: 67.67 mL/min (ref >60.00)
GLUCOSE: 137 mg/dL — AB (ref 70–99)
POTASSIUM: 3.9 meq/L (ref 3.5–5.1)
Sodium: 137 mEq/L (ref 135–145)
Total Bilirubin: 0.4 mg/dL (ref 0.2–1.2)
Total Protein: 6.8 g/dL (ref 6.0–8.3)

## 2017-12-25 LAB — POCT GLYCOSYLATED HEMOGLOBIN (HGB A1C): Hemoglobin A1C: 6.7 % — AB (ref 4.0–5.6)

## 2017-12-25 NOTE — Progress Notes (Signed)
Patient ID: [name], female   DOB: Dec 13, 1955, 62 y.o.   MRN: [id number]           Reason for Appointment: Follow-up for Type 2 Diabetes  Referring physician: Kathryne Eriksson  History of Present Illness:          Diagnosis: Type 2 diabetes mellitus, date of diagnosis:  2010       Past history:  She was having symptoms of neuropathy at onset At that time she was started on metformin alone, 500 mg twice a day and this was continued unchanged until recently Apparently she was also given Onglyza about 4 years ago but details of her control at that time is not available Her A1c levels have been mild increase over the last year but she thinks her blood sugars had been generally good with only some readings up to 200. A1c was 7.1 in 1/15 and she was continued on metformin and Onglyza When her A1c was 7.9 in August she was told to double her metformin to 2 g daily She  had blood sugars of 200 or more prior to her initial consultation  Her Trulicity was changed to Victoza because of diarrhea and abdominal pain and has been able to tolerate Victoza  1.8 hs  When A1c was 7.6 she was started on Invokana 100 mg in 11/16, this was subsequently increased when her A1c was 7.5 in 10/17 She has been on insulin since 12/2016  Recent history:   INSULIN regimen: Tresiba  26 units daily  Non-insulin hypoglycemic drugs the patient is taking are:  metformin ER 2 g daily, Invokana 300 mg daily, Ozempic 0.5 mg weekly     Her A1c about the same at 6.7 again, highest in the last few visits has been 7.7   Current blood sugar patterns and problems identified:  She has not brought her monitor today  She has been taking Ozempic since about November 2018  She thinks her blood glucose has been fluctuating less, previously had more variability after evening meal  She has gained weight and she thinks this is from not finding the time to walk and exercise  Also she is cooking differently for her family  recently  No side effects from [place] currently and is taking it regularly every week  Also tolerating Invokana well  No hypoglycemia overnight with current dose of Tresiba.     Side effects from medications have been: Diarrhea, abdominal pain with Trulicity Compliance with the medical regimen: Good  Glucose monitoring:  done less than once  a day         Glucometer: Contour       Blood Glucose readings from recall:  Fasting 110-120 After dinner 140-160    Self-care: The diet that the patient has been following is: tries to limit carbohydrates .     Meals: 3 meals per day. Breakfast is yogurt/ cereal at 9 am; , lunch is a sandwich, and dinner at 5 pm will have baked chicken, starch and vegetables.  Has snacks with yogurt, cottage cheese, fruit, nuts and  rice cakes            Exercise: walks at times    Dietician visit, most recent: 9/15              Weight history:  Wt Readings from Last 3 Encounters:  12/25/17 227 lb 6.4 oz (103.1 kg)  11/12/17 233 lb (105.7 kg)  09/24/17 223 lb (101.2 kg)    Glycemic  control:     Lab Results  Component Value Date   HGBA1C 6.7 (A) 12/25/2017   HGBA1C 6.6 09/24/2017   HGBA1C 6.5 05/22/2017   Lab Results  Component Value Date   MICROALBUR <0.7 09/24/2017   LDLCALC 84 05/22/2017   CREATININE 0.90 07/15/2017       Allergies as of 12/25/2017      Reactions   Bisoprolol-hydrochlorothiazide Swelling, Other (See Comments)   Face and throat became swollen; wheezing also   Relafen [nabumetone] Hives, Shortness Of Breath   Aspirin Other (See Comments)   Irritates patient's IBS and Diverticulitis   Dulaglutide Other (See Comments)   Trulicity: Abdominal pain   Other Other (See Comments)   Seasonal allergies: Runny nose/eyes and congestion   Restasis [cyclosporine] Itching, Rash, Other (See Comments)   Eyes burn and rash develops around the eyes      Medication List        Accurate as of 12/25/17 11:22 AM. Always use your most  recent med list.          acetaminophen 650 MG CR tablet Commonly known as:  TYLENOL Take 650-1,300 mg by mouth every 8 (eight) hours as needed (for pain or headaches).   albuterol 108 (90 Base) MCG/ACT inhaler Commonly known as:  PROVENTIL HFA;VENTOLIN HFA Inhale into the lungs.   PROAIR HFA 108 (90 Base) MCG/ACT inhaler Generic drug:  albuterol Inhale 2 puffs into the lungs every 6 (six) hours as needed.   atorvastatin 20 MG tablet Commonly known as:  LIPITOR Take 1 tablet (20 mg total) by mouth daily.   BAYER CONTOUR NEXT MONITOR w/Device Kit Use to check blood sugar 2 times per day dx code E11.49   BAYER MICROLET LANCETS lancets Use as instructed to check blood sugar 2 times per day dx code E11.49   butalbital-acetaminophen-caffeine 50-325-40 MG tablet Commonly known as:  FIORICET, ESGIC Take 1 tablet by mouth every 6 (six) hours as needed for headache. Do not refill in less than 30 days.   CONTOUR NEXT TEST test strip Generic drug:  glucose blood TEST 2 TIMES A DAY E11.49   fluconazole 150 MG tablet Commonly known as:  DIFLUCAN Take 1 tablet (150 mg total) by mouth once.   gabapentin 600 MG tablet Commonly known as:  NEURONTIN TAKE 1 TABLET 3 TIMES PER DAY AS NEEDED   insulin degludec 100 UNIT/ML Sopn FlexTouch Pen Commonly known as:  TRESIBA FLEXTOUCH 26 units once daily   Insulin Pen Needle 32G X 4 MM Misc Use one per day to inject victoza   INVOKANA 300 MG Tabs tablet Generic drug:  canagliflozin TAKE 1 TABLET (300 MG TOTAL) BY MOUTH DAILY BEFORE BREAKFAST.   lisinopril 10 MG tablet Commonly known as:  PRINIVIL,ZESTRIL TAKE 1 TABLET BY MOUTH EVERY DAY   metFORMIN 500 MG 24 hr tablet Commonly known as:  GLUCOPHAGE-XR Take 1,000 mg by mouth two times a day   nitroGLYCERIN 0.4 MG SL tablet Commonly known as:  NITROSTAT Place 1 tablet (0.4 mg total) under the tongue every 5 (five) minutes as needed for chest pain.   nortriptyline 25 MG  capsule Commonly known as:  PAMELOR Take 2 capsules (50 mg total) by mouth at bedtime.   OZEMPIC 0.25 or 0.5 MG/DOSE Sopn Generic drug:  Semaglutide INJECT 0.5 MG ONCE A WEEK INTO THE SKIN.   Venlafaxine HCl 225 MG Tb24 Take 225 mg by mouth daily.       Allergies:  Allergies  Allergen Reactions  .  Bisoprolol-Hydrochlorothiazide Swelling and Other (See Comments)    Face and throat became swollen; wheezing also  . Relafen [Nabumetone] Hives and Shortness Of Breath  . Aspirin Other (See Comments)    Irritates patient's IBS and Diverticulitis  . Dulaglutide Other (See Comments)    Trulicity: Abdominal pain  . Other Other (See Comments)    Seasonal allergies: Runny nose/eyes and congestion  . Restasis [Cyclosporine] Itching, Rash and Other (See Comments)    Eyes burn and rash develops around the eyes    Past Medical History:  Diagnosis Date  . Colon polyps   . Depression 05/10/2013  . Diabetes mellitus without complication ([place])   . 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 Diverticulosis   . Headache   . Hypertension   . Irritable bowel   . 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 Neuropathy   . Neuropathy associated with endocrine disorder ([place])   . Obesity (BMI 30-39.9)   . Osteopenia   . Visual disturbance     Past Surgical History:  Procedure Laterality Date  . arm surgery Right   . plantar faci      Family History  Problem Relation Age of Onset  . Diabetes type II Mother   . Stroke Mother   . Hypertension Mother   . Heart disease Father   . Hypertension Father   . Diabetes Father   . Heart attack Father   . Heart disease Brother   . Hypertension Brother   . Diabetes Brother   . Kidney disease Brother   . Diabetes Sister   . Asthma Maternal Grandmother   . Hypertension Maternal Grandmother   . Heart attack Maternal Grandmother   . Heart attack Maternal Grandfather   . Heart attack Paternal Grandmother   . Diabetes Paternal  Grandfather   . Heart attack Paternal Grandfather   . Diabetes Brother   . Heart attack Brother   . Diabetes Brother   . Diabetes Brother   . Other Brother        Bright's Disease  . Leukemia Other        brother's grandson    Social History:  reports that she has never smoked. She has never used smokeless tobacco. She reports that she does not drink alcohol or use drugs.    Review of Systems    She is having flashes of light and apparently has had some swelling of the optic nerve again which is not improved Also reportedly has had some hemorrhages She is asking about Ozempic causing papillitis, however has been on a GLP-1 drugs for several years and no significant change in blood sugar recently       Lipids: These have been controlled with Lipitor 20 mg daily as of 11/18       Lab Results  Component Value Date   CHOL 169 05/22/2017   HDL 66.40 05/22/2017   LDLCALC 84 05/22/2017   TRIG 94.0 05/22/2017   CHOLHDL 3 05/22/2017                  The blood pressure has been high for several years treated by PCP  Blood pressure is consistent now with taking 10 mg lisinopril    BP Readings from Last 3 Encounters:  12/25/17 128/80  11/12/17 128/82  09/24/17 122/82        She has a long history of Numbness, pain, tingling or burning in feet    Symptoms relieved with  gabapentin to 600 mg 3 times a day Taking tramadol as needed also    Foot exam in 7/18 showed decreased monofilament sensation  Physical Examination:  BP 128/80 (BP Location: Left Arm, Patient Position: Sitting, Cuff Size: Normal)   Pulse 80   Ht 5' 6"  (1.676 m)   Wt 227 lb 6.4 oz (103.1 kg)   SpO2 97%   BMI 36.70 kg/m    ASSESSMENT:  Diabetes type 2, insulin requiring with obesity  She is on a regimen of TRESIBA 26 units daily, Ozempic 0.5 Invokana 300 mg ,  metformin 2 g  See history of present illness for detailed discussion of current diabetes management, blood sugar patterns and problems  identified  Blood sugars are appearing again very well controlled with A1c 6.7 and about the same the last 2 times also Although she has gained a little weight she think she can do better with her diet and exercise regimen Not clear if her blood sugars are consistent as she did not bring her monitor for review today   HYPERTENSION: Blood pressure is controlled with lisinopril   PLAN:   She will continue the same dose of medications Also she thinks she can start getting back on her walking program and consistent diet again She will call us if her blood sugars are unusually higher or lower  HYPERTENSION: She will continue on 10 mg of lisinopril  LIPIDS: We will get follow-up labs   Follow-up in 4 months   There are no Patient Instructions on file for this visit.       Elayne Snare 12/25/2017, 11:22 AM     Note: This office note was prepared with Dragon voice recognition system technology. Any transcriptional errors that result from this process are unintentional.

## 2018-01-23 DIAGNOSIS — H3581 Retinal edema: Secondary | ICD-10-CM | POA: Insufficient documentation

## 2018-01-23 DIAGNOSIS — H35063 Retinal vasculitis, bilateral: Secondary | ICD-10-CM | POA: Insufficient documentation

## 2018-01-23 DIAGNOSIS — H35033 Hypertensive retinopathy, bilateral: Secondary | ICD-10-CM | POA: Insufficient documentation

## 2018-02-14 ENCOUNTER — Other Ambulatory Visit: Payer: Self-pay | Admitting: Endocrinology

## 2018-02-25 ENCOUNTER — Other Ambulatory Visit: Payer: Self-pay | Admitting: Endocrinology

## 2018-03-04 ENCOUNTER — Other Ambulatory Visit: Payer: Self-pay | Admitting: Endocrinology

## 2018-04-23 ENCOUNTER — Other Ambulatory Visit: Payer: Self-pay | Admitting: Endocrinology

## 2018-04-25 ENCOUNTER — Ambulatory Visit: Payer: BLUE CROSS/BLUE SHIELD | Admitting: Endocrinology

## 2018-04-26 ENCOUNTER — Other Ambulatory Visit: Payer: Self-pay | Admitting: Endocrinology

## 2018-04-30 ENCOUNTER — Telehealth: Payer: Self-pay | Admitting: Family Medicine

## 2018-04-30 ENCOUNTER — Ambulatory Visit: Payer: BLUE CROSS/BLUE SHIELD | Admitting: Endocrinology

## 2018-04-30 NOTE — Telephone Encounter (Signed)
[facility]  Pt called wanting to speak to someone in the office regard [place] in [place]

## 2018-05-02 NOTE — Telephone Encounter (Signed)
LMTCB to discuss note below 

## 2018-05-02 NOTE — Telephone Encounter (Signed)
Spoke to patient and she is stating her invokana is not covered- I informed the patient we would need for her to contact insurance company to find out what is covered and call us back to let us know-she agreed to this

## 2018-05-02 NOTE — Telephone Encounter (Signed)
Best number [phone] Pt returned your call Pt will be leaving @2 :45 and would like a call before then

## 2018-05-05 ENCOUNTER — Telehealth: Payer: Self-pay | Admitting: Endocrinology

## 2018-05-05 NOTE — Telephone Encounter (Signed)
Called pt --stated have not been to the office sine with the new insurance. I told we need to scan/copy of the new insurance on the file. Pt understood and will come by the office.

## 2018-05-05 NOTE — Telephone Encounter (Signed)
Patient stated she spoke with a nurse about her medications on her Friday about them not being coverd by insurance.  [name] and Jardiance These medications listed are covered with new insurance. Patient wants Dr. Dwyane Dee to fill whichever medication he feels best. Please Advise. [id number] - [place], [place]

## 2018-05-22 ENCOUNTER — Other Ambulatory Visit: Payer: Self-pay

## 2018-05-22 ENCOUNTER — Telehealth: Payer: Self-pay | Admitting: Endocrinology

## 2018-05-22 MED ORDER — DAPAGLIFLOZIN PROPANEDIOL 10 MG PO TABS: ORAL_TABLET | ORAL | 2 refills | Status: DC

## 2018-05-22 NOTE — Telephone Encounter (Signed)
Per Patient insurance will not cover [name], but will cover [name].  She needs her Rx changed.  Per patient has not had medication since first of month.

## 2018-05-22 NOTE — Telephone Encounter (Signed)
[place] please advise

## 2018-05-22 NOTE — Telephone Encounter (Signed)
She can change to 10 mg [name] once daily

## 2018-05-22 NOTE — Telephone Encounter (Signed)
This has been switched patient notified

## 2018-06-05 ENCOUNTER — Other Ambulatory Visit: Payer: Self-pay | Admitting: Endocrinology

## 2018-06-16 ENCOUNTER — Other Ambulatory Visit: Payer: Self-pay | Admitting: Family Medicine

## 2018-06-16 DIAGNOSIS — Z1231 Encounter for screening mammogram for malignant neoplasm of breast: Secondary | ICD-10-CM

## 2018-06-19 ENCOUNTER — Ambulatory Visit
Admission: RE | Admit: 2018-06-19 | Discharge: 2018-06-19 | Disposition: A | Discharge status: Home / Self Care | Payer: Managed Care, Other (non HMO) | Source: Ambulatory Visit | Attending: Family Medicine | Admitting: Family Medicine

## 2018-06-19 DIAGNOSIS — Z1231 Encounter for screening mammogram for malignant neoplasm of breast: Secondary | ICD-10-CM

## 2018-07-07 ENCOUNTER — Encounter: Payer: Self-pay | Admitting: Endocrinology

## 2018-07-07 ENCOUNTER — Ambulatory Visit: Payer: Managed Care, Other (non HMO) | Admitting: Endocrinology

## 2018-07-07 VITALS — BP 118/70 | HR 89 | Ht 66.0 in | Wt 232.6 lb

## 2018-07-07 DIAGNOSIS — E1140 Type 2 diabetes mellitus with diabetic neuropathy, unspecified: Secondary | ICD-10-CM | POA: Diagnosis not present

## 2018-07-07 DIAGNOSIS — I10 Essential (primary) hypertension: Secondary | ICD-10-CM

## 2018-07-07 LAB — POCT GLYCOSYLATED HEMOGLOBIN (HGB A1C): HEMOGLOBIN A1C: 8 % — AB (ref 4.0–5.6)

## 2018-07-07 LAB — COMPREHENSIVE METABOLIC PANEL WITH GFR
ALT: 33 U/L (ref 0–35)
AST: 22 U/L (ref 0–37)
Albumin: 4.5 g/dL (ref 3.5–5.2)
Alkaline Phosphatase: 90 U/L (ref 39–117)
BUN: 10 mg/dL (ref 6–23)
CO2: 27 meq/L (ref 19–32)
Calcium: 10 mg/dL (ref 8.4–10.5)
Chloride: 101 meq/L (ref 96–112)
Creatinine, Ser: 0.87 mg/dL (ref 0.40–1.20)
GFR: 70.24 mL/min
Glucose, Bld: 148 mg/dL — ABNORMAL HIGH (ref 70–99)
Potassium: 4.3 meq/L (ref 3.5–5.1)
Sodium: 138 meq/L (ref 135–145)
Total Bilirubin: 0.4 mg/dL (ref 0.2–1.2)
Total Protein: 7.1 g/dL (ref 6.0–8.3)

## 2018-07-07 MED ORDER — INSULIN LISPRO (1 UNIT DIAL) 100 UNIT/ML (KWIKPEN): PEN_INJECTOR | SUBCUTANEOUS | 0 refills | Status: DC

## 2018-07-07 MED ORDER — SEMAGLUTIDE (1 MG/DOSE) 2 MG/1.5ML ~~LOC~~ SOPN: 1.0000 mg | PEN_INJECTOR | SUBCUTANEOUS | 1 refills | Status: DC

## 2018-07-07 NOTE — Progress Notes (Signed)
Patient ID: [name], female   DOB: 25-Aug-1955, 62 y.o.   MRN: [id number]           Reason for Appointment: Follow-up for Type 2 Diabetes  Referring physician: Kathryne Eriksson  History of Present Illness:          Diagnosis: Type 2 diabetes mellitus, date of diagnosis:  2010       Past history:  She was having symptoms of neuropathy at onset At that time she was started on metformin alone, 500 mg twice a day and this was continued unchanged until recently Apparently she was also given Onglyza about 4 years ago but details of her control at that time is not available Her A1c levels have been mild increase over the last year but she thinks her blood sugars had been generally good with only some readings up to 200. A1c was 7.1 in 1/15 and she was continued on metformin and Onglyza When her A1c was 7.9 in August she was told to double her metformin to 2 g daily She  had blood sugars of 200 or more prior to her initial consultation  Her Trulicity was changed to Victoza because of diarrhea and abdominal pain and has been able to tolerate Victoza  1.8 hs  When A1c was 7.6 she was started on Invokana 100 mg in 11/16, this was subsequently increased when her A1c was 7.5 in 10/17 She has been on insulin since 12/2016  Recent history:   INSULIN regimen: Tresiba  26 units daily  Non-insulin hypoglycemic drugs the patient is taking are:  metformin ER 2 g daily, Farxiga 10 mg daily, Ozempic 0.5 mg weekly     Her A1c is 8, previously has been fairly steady around 6.5-6.7  Current blood sugar patterns and problems identified:  She has brought her monitor today  She was last seen in 6/19 and has not followed up  She checks her blood sugars mostly in the evenings probably close to bedtime and usually not at other times  Most of her sugars are significantly high with only a couple of near normal blood sugars around 5 PM or 8 PM  Although she thinks she is avoiding high fat foods and sweets  she has gained weight  She has been more depressed  She thinks she is taking her has been taking Ozempic, [place] and metformin regularly  Her insurance made her change from [place] to preferred drug Farxiga  Highest blood sugar was over 300 sometime after lunch   Side effects from medications have been: Diarrhea, abdominal pain with Trulicity Compliance with the medical regimen: Good  Glucose monitoring:  done less than once  a day         Glucometer: Contour       Blood Glucose readings from download   PRE-MEAL Fasting Lunch  evening Bedtime Overall  Glucose range: ?    118-201    Mean/median:     198   POST-MEAL PC Breakfast PC Lunch PC Dinner  Glucose range:  196  201, 321  168-270  Mean/median:        Self-care: The diet that the patient has been following is: tries to limit carbohydrates .     Meals: 3 meals per day. Breakfast is yogurt/ cereal at 10 am; , lunch is a sandwich, and dinner at 5 pm will have baked chicken, starch and vegetables.  Has snacks with yogurt, cottage cheese, fruit, nuts and  rice cakes  Exercise: not walking recently    Dietician visit, most recent: 9/15              Weight history:  Wt Readings from Last 3 Encounters:  07/07/18 232 lb 9.6 oz (105.5 kg)  12/25/17 227 lb 6.4 oz (103.1 kg)  11/12/17 233 lb (105.7 kg)    Glycemic control:     Lab Results  Component Value Date   HGBA1C 8.0 (A) 07/07/2018   HGBA1C 6.7 (A) 12/25/2017   HGBA1C 6.6 09/24/2017   Lab Results  Component Value Date   MICROALBUR <0.7 09/24/2017   LDLCALC 75 12/25/2017   CREATININE 0.90 12/25/2017       Allergies as of 07/07/2018      Reactions   Bisoprolol-hydrochlorothiazide Swelling, Other (See Comments)   Face and throat became swollen; wheezing also   Relafen [nabumetone] Hives, Shortness Of Breath   Aspirin Other (See Comments)   Irritates patient's IBS and Diverticulitis   Dulaglutide Other (See Comments)   Trulicity: Abdominal pain     Other Other (See Comments)   Seasonal allergies: Runny nose/eyes and congestion   Restasis [cyclosporine] Itching, Rash, Other (See Comments)   Eyes burn and rash develops around the eyes      Medication List       Accurate as of July 07, 2018  3:30 PM. Always use your most recent med list.        acetaminophen 650 MG CR tablet Commonly known as:  TYLENOL Take 650-1,300 mg by mouth every 8 (eight) hours as needed (for pain or headaches).   albuterol 108 (90 Base) MCG/ACT inhaler Commonly known as:  PROVENTIL HFA;VENTOLIN HFA Inhale into the lungs.   PROAIR HFA 108 (90 Base) MCG/ACT inhaler Generic drug:  albuterol Inhale 2 puffs into the lungs every 6 (six) hours as needed.   atorvastatin 20 MG tablet Commonly known as:  LIPITOR Take 1 tablet (20 mg total) by mouth daily.   BAYER CONTOUR NEXT MONITOR w/Device Kit Use to check blood sugar 2 times per day dx code E11.49   BAYER MICROLET LANCETS lancets Use as instructed to check blood sugar 2 times per day dx code E11.49   butalbital-acetaminophen-caffeine 50-325-40 MG tablet Commonly known as:  FIORICET, ESGIC Take 1 tablet by mouth every 6 (six) hours as needed for headache. Do not refill in less than 30 days.   CONTOUR NEXT TEST test strip Generic drug:  glucose blood TEST 2 TIMES A DAY E11.49   dapagliflozin propanediol 10 MG Tabs tablet Commonly known as:  FARXIGA Take one tablet daily   gabapentin 600 MG tablet Commonly known as:  NEURONTIN TAKE 1 TABLET 3 TIMES PER DAY AS NEEDED   insulin degludec 100 UNIT/ML Sopn FlexTouch Pen Commonly known as:  TRESIBA FLEXTOUCH 26 units once daily   insulin lispro 100 UNIT/ML KwikPen Commonly known as:  HUMALOG KWIKPEN 8 units before meals   Insulin Pen Needle 32G X 4 MM Misc Use one per day to inject victoza   lisinopril 10 MG tablet Commonly known as:  PRINIVIL,ZESTRIL TAKE 1 TABLET BY MOUTH EVERY DAY   metFORMIN 500 MG 24 hr tablet Commonly known  as:  GLUCOPHAGE-XR TAKE 2 TABLETS BY MOUTH TWO TIMES A DAY   methotrexate 2.5 MG tablet Commonly known as:  RHEUMATREX Take 2.5 mg by mouth once a week. TAKE 6 TABLETS BY MOUTH AT ONE TIME EVERY Monday.   nitroGLYCERIN 0.4 MG SL tablet Commonly known as:  NITROSTAT Place 1  tablet (0.4 mg total) under the tongue every 5 (five) minutes as needed for chest pain.   nortriptyline 25 MG capsule Commonly known as:  PAMELOR Take 2 capsules (50 mg total) by mouth at bedtime.   Semaglutide (1 MG/DOSE) 2 MG/1.5ML Sopn Commonly known as:  OZEMPIC (1 MG/DOSE) Inject 1 mg into the skin once a week.   Venlafaxine HCl 225 MG Tb24 Take 225 mg by mouth daily.       Allergies:  Allergies  Allergen Reactions  . Bisoprolol-Hydrochlorothiazide Swelling and Other (See Comments)    Face and throat became swollen; wheezing also  . Relafen [Nabumetone] Hives and Shortness Of Breath  . Aspirin Other (See Comments)    Irritates patient's IBS and Diverticulitis  . Dulaglutide Other (See Comments)    Trulicity: Abdominal pain  . Other Other (See Comments)    Seasonal allergies: Runny nose/eyes and congestion  . Restasis [Cyclosporine] Itching, Rash and Other (See Comments)    Eyes burn and rash develops around the eyes    Past Medical History:  Diagnosis Date  . Colon polyps   . Depression 05/10/2013  . Diabetes mellitus without complication ([place])   . 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 Diverticulosis   . Headache   . Hypertension   . Irritable bowel   . 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 Neuropathy   . Neuropathy associated with endocrine disorder ([place])   . Obesity (BMI 30-39.9)   . Osteopenia   . Visual disturbance     Past Surgical History:  Procedure Laterality Date  . arm surgery Right   . plantar faci      Family History  Problem Relation Age of Onset  . Diabetes type II Mother   . Stroke Mother   . Hypertension Mother   . Heart disease Father     . Hypertension Father   . Diabetes Father   . Heart attack Father   . Heart disease Brother   . Hypertension Brother   . Diabetes Brother   . Kidney disease Brother   . Diabetes Sister   . Asthma Maternal Grandmother   . Hypertension Maternal Grandmother   . Heart attack Maternal Grandmother   . Heart attack Maternal Grandfather   . Heart attack Paternal Grandmother   . Diabetes Paternal Grandfather   . Heart attack Paternal Grandfather   . Diabetes Brother   . Heart attack Brother   . Diabetes Brother   . Diabetes Brother   . Other Brother        Bright's Disease  . Leukemia Other        brother's grandson  . Breast cancer Neg Hx     Social History:  reports that she has never smoked. She has never used smokeless tobacco. She reports that she does not drink alcohol or use drugs.    Review of Systems    She is now on methotrexate from her ophthalmologist for unknown inflammatory disease she is showing some improvement       Lipids: These have been controlled with Lipitor 20 mg daily as of 11/18       Lab Results  Component Value Date   CHOL 159 12/25/2017   HDL 62.40 12/25/2017   LDLCALC 75 12/25/2017   TRIG 111.0 12/25/2017   CHOLHDL 3 12/25/2017                  The blood pressure has been high for  several years treated by PCP  Blood pressure is consistent with taking 10 mg lisinopril    BP Readings from Last 3 Encounters:  07/07/18 118/70  12/25/17 128/80  11/12/17 128/82        She has a long history of Numbness, pain, tingling and burning in feet    Symptoms relieved with  gabapentin to 600 mg 3 times a day Taking tramadol as needed also    Foot exam in 7/18 showed decreased monofilament sensation  Physical Examination:  BP 118/70 (BP Location: Left Arm, Patient Position: Sitting, Cuff Size: Normal)   Pulse 89   Ht 5' 6" (1.676 m)   Wt 232 lb 9.6 oz (105.5 kg)   SpO2 99%   BMI 37.54 kg/m    ASSESSMENT:  Diabetes type 2, insulin  requiring with obesity  She is on a regimen of TRESIBA 26 units daily, Ozempic 0.5   metformin 2 g and Farxiga  See history of present illness for detailed discussion of current diabetes management, blood sugar patterns and problems identified  Her A1c is 8% which is unusually high for her  Most likely because of her depression and weight gain her blood sugars are poorly controlled However not clear if she may be having some progression of her diabetes also Blood sugar monitoring is inconsistent She thinks her diet is not significantly affected but her weight has gone up, likely may be getting more snacks  Since she has readings over 200 frequently after meals will need to at least cover her main meals with mealtime insulin Also need to know what her fasting blood sugars are since she does not monitor this   HYPERTENSION: Blood pressure is controlled with lisinopril  Depression: She needs to follow-up with PCP and also discussed with him the left upper quadrant abdominal discomfort he is having   PLAN:    Today discussed in detail the need for mealtime insulin to cover postprandial spikes, action of mealtime insulin, use of the insulin pen, timing and action of the rapid acting insulin as well as starting dose and dosage titration to target the two-hour reading of under 180  She will start with 8 units of Humalog with her main meal which is usually dinner but can be taken at lunch for larger meals also  More consistent glucose monitoring  Discussed blood sugar targets  Encouraged her to start regular exercise with at least walking  Diet needs to be consistent with controlling portions, snacks and carbohydrates  If her fasting readings are consistently high may need a higher dose of Tresiba and OZEMPIC will be increased up to 1 mg weekly  More regular follow-up   HYPERTENSION: She will continue on 10 mg of lisinopril  LIPIDS: We will get follow-up fasting labs when blood  sugars are controlled Needs follow-up in 6 weeks  Counseling time on subjects discussed in assessment and plan sections is over 50% of today's 25 minute visit   Patient Instructions  8 Units Humalog before main meals          [name] 07/07/2018, 3:30 PM     Note: This office note was prepared with Dragon voice recognition system technology. Any transcriptional errors that result from this process are unintentional.

## 2018-07-07 NOTE — Patient Instructions (Signed)
8 Units Humalog before main meals

## 2018-08-11 ENCOUNTER — Ambulatory Visit: Payer: Managed Care, Other (non HMO) | Admitting: Endocrinology

## 2018-08-11 ENCOUNTER — Encounter: Payer: Self-pay | Admitting: Endocrinology

## 2018-08-11 VITALS — BP 110/76 | HR 84 | Ht 66.0 in | Wt 231.2 lb

## 2018-08-11 DIAGNOSIS — E1165 Type 2 diabetes mellitus with hyperglycemia: Secondary | ICD-10-CM

## 2018-08-11 DIAGNOSIS — Z794 Long term (current) use of insulin: Secondary | ICD-10-CM | POA: Diagnosis not present

## 2018-08-11 DIAGNOSIS — I10 Essential (primary) hypertension: Secondary | ICD-10-CM

## 2018-08-11 DIAGNOSIS — Z23 Encounter for immunization: Secondary | ICD-10-CM | POA: Diagnosis not present

## 2018-08-11 NOTE — Patient Instructions (Signed)
Take 6-10 Humalog at supper based on type of meal and Carbs  Take 4 Humalog at Bfst  Check blood sugars on waking up 2-3 days a week  Also check blood sugars about 2 hours after meals and do this after different meals by rotation  Recommended blood sugar levels on waking up are 90-130 and about 2 hours after meal is 130-180  Please bring your blood sugar monitor to each visit, thank you

## 2018-08-11 NOTE — Progress Notes (Signed)
Patient ID: [name], female   DOB: 05/06/1956, 63 y.o.   MRN: [id number]           Reason for Appointment: Follow-up for Type 2 Diabetes  Referring physician: Kathryne Eriksson  History of Present Illness:          Diagnosis: Type 2 diabetes mellitus, date of diagnosis:  2010       Past history:  She was having symptoms of neuropathy at onset At that time she was started on metformin alone, 500 mg twice a day and this was continued unchanged until recently Apparently she was also given Onglyza about 4 years ago but details of her control at that time is not available Her A1c levels have been mild increase over the last year but she thinks her blood sugars had been generally good with only some readings up to 200. A1c was 7.1 in 1/15 and she was continued on metformin and Onglyza When her A1c was 7.9 in August she was told to double her metformin to 2 g daily She  had blood sugars of 200 or more prior to her initial consultation  Her Trulicity was changed to Victoza because of diarrhea and abdominal pain and has been able to tolerate Victoza  1.8 hs  When A1c was 7.6 she was started on Invokana 100 mg in 11/16, this was subsequently increased when her A1c was 7.5 in 10/17 She has been on insulin since 12/2016  Recent history:   INSULIN regimen: Tresiba  26 units daily, Humalog 6-8 at suppertime  Non-insulin hypoglycemic drugs the patient is taking are:  metformin ER 2 g daily, Farxiga 10 mg daily, Ozempic 0.5 mg weekly     Her A1c last was 8, previously has been fairly steady around 6.5-6.7  Current blood sugar patterns and problems identified:  She has brought her monitor today  Since she had persistently high readings after dinner along with high A1c she was started on mealtime insulin at dinnertime  She is having relatively better readings but she feels that when her blood sugar goes down to the 90s she feels weak and nauseated  Otherwise no hypoglycemia documented  She  does understand the need to take her Humalog right before eating at least or a few minutes before  She checks her blood sugars mostly in the evenings sometime after supper  Her blood sugars fluctuate based on her carbohydrate intake  She thinks her highest blood sugars are when she is eating foods like pasta  She is overall feeling better and trying to watch her diet more consistently with better motivation and her weight is down slightly  Also she is trying to walk when she can  However not clear if her blood sugars are consistently high after breakfast and lunch, had a reading of 225 after breakfast and she thinks she is usually eating the same meal in the morning   Side effects from medications have been: Diarrhea, abdominal pain with Trulicity Compliance with the medical regimen: Good  Glucose monitoring:  done less than once  a day         Glucometer: Contour       Blood Glucose readings from download   PRE-MEAL Fasting Lunch Dinner Bedtime Overall  Glucose range:  134      Mean/median:        POST-MEAL PC Breakfast PC Lunch PC Dinner  Glucose range:  225   95-219  Mean/median:    155    PREVIOUS  reading  PRE-MEAL Fasting Lunch  evening Bedtime Overall  Glucose range: ?    118-201    Mean/median:     198   POST-MEAL PC Breakfast PC Lunch PC Dinner  Glucose range:  196  201, 321  168-270  Mean/median:        Self-care: The diet that the patient has been following is: tries to limit carbohydrates .     Meals: 3 meals per day. Breakfast is yogurt/ cereal/eggs with toast at 9-10 am; , lunch is a sandwich, and dinner at 5 pm will have baked chicken, starch and vegetables.  Has snacks with yogurt, cottage cheese, fruit, nuts and  rice cakes            Exercise: Periodically walking    Dietician visit, most recent: 9/15              Weight history:  Wt Readings from Last 3 Encounters:  08/11/18 231 lb 3.2 oz (104.9 kg)  07/07/18 232 lb 9.6 oz (105.5 kg)  12/25/17  227 lb 6.4 oz (103.1 kg)    Glycemic control:     Lab Results  Component Value Date   HGBA1C 8.0 (A) 07/07/2018   HGBA1C 6.7 (A) 12/25/2017   HGBA1C 6.6 09/24/2017   Lab Results  Component Value Date   MICROALBUR <0.7 09/24/2017   [place] 75 12/25/2017   CREATININE 0.87 07/07/2018       Allergies as of 08/11/2018      Reactions   Bisoprolol-hydrochlorothiazide Swelling, Other (See Comments)   Face and throat became swollen; wheezing also   Relafen [nabumetone] Hives, Shortness Of Breath   Aspirin Other (See Comments)   Irritates patient's IBS and Diverticulitis   Dulaglutide Other (See Comments)   Trulicity: Abdominal pain   Other Other (See Comments)   Seasonal allergies: Runny nose/eyes and congestion   Restasis [cyclosporine] Itching, Rash, Other (See Comments)   Eyes burn and rash develops around the eyes      Medication List       Accurate as of August 11, 2018  8:46 PM. Always use your most recent med list.        acetaminophen 650 MG CR tablet Commonly known as:  TYLENOL Take 650-1,300 mg by mouth every 8 (eight) hours as needed (for pain or headaches).   albuterol 108 (90 Base) MCG/ACT inhaler Commonly known as:  PROVENTIL HFA;VENTOLIN HFA Inhale into the lungs.   PROAIR HFA 108 (90 Base) MCG/ACT inhaler Generic drug:  albuterol Inhale 2 puffs into the lungs every 6 (six) hours as needed.   atorvastatin 20 MG tablet Commonly known as:  LIPITOR Take 1 tablet (20 mg total) by mouth daily.   BAYER CONTOUR NEXT MONITOR w/Device Kit Use to check blood sugar 2 times per day dx code E11.49   BAYER MICROLET LANCETS lancets Use as instructed to check blood sugar 2 times per day dx code E11.49   butalbital-acetaminophen-caffeine 50-325-40 MG tablet Commonly known as:  FIORICET, ESGIC Take 1 tablet by mouth every 6 (six) hours as needed for headache. Do not refill in less than 30 days.   CONTOUR NEXT TEST test strip Generic drug:  glucose blood TEST 2  TIMES A DAY E11.49   dapagliflozin propanediol 10 MG Tabs tablet Commonly known as:  FARXIGA Take one tablet daily   gabapentin 600 MG tablet Commonly known as:  NEURONTIN TAKE 1 TABLET 3 TIMES PER DAY AS NEEDED   insulin degludec 100 UNIT/ML Sopn FlexTouch  Pen Commonly known as:  TRESIBA FLEXTOUCH 26 units once daily   insulin lispro 100 UNIT/ML KwikPen Commonly known as:  HUMALOG KWIKPEN 8 units before meals   Insulin Pen Needle 32G X 4 MM Misc Use one per day to inject victoza   lisinopril 10 MG tablet Commonly known as:  PRINIVIL,ZESTRIL TAKE 1 TABLET BY MOUTH EVERY DAY   metFORMIN 500 MG 24 hr tablet Commonly known as:  GLUCOPHAGE-XR TAKE 2 TABLETS BY MOUTH TWO TIMES A DAY   methotrexate 2.5 MG tablet Commonly known as:  RHEUMATREX Take 2.5 mg by mouth once a week. TAKE 6 TABLETS BY MOUTH AT ONE TIME EVERY Monday.   nitroGLYCERIN 0.4 MG SL tablet Commonly known as:  NITROSTAT Place 1 tablet (0.4 mg total) under the tongue every 5 (five) minutes as needed for chest pain.   nortriptyline 25 MG capsule Commonly known as:  PAMELOR Take 2 capsules (50 mg total) by mouth at bedtime.   Semaglutide (1 MG/DOSE) 2 MG/1.5ML Sopn Commonly known as:  OZEMPIC (1 MG/DOSE) Inject 1 mg into the skin once a week.   Venlafaxine HCl 225 MG Tb24 Take 225 mg by mouth daily.       Allergies:  Allergies  Allergen Reactions  . Bisoprolol-Hydrochlorothiazide Swelling and Other (See Comments)    Face and throat became swollen; wheezing also  . Relafen [Nabumetone] Hives and Shortness Of Breath  . Aspirin Other (See Comments)    Irritates patient's IBS and Diverticulitis  . Dulaglutide Other (See Comments)    Trulicity: Abdominal pain  . Other Other (See Comments)    Seasonal allergies: Runny nose/eyes and congestion  . Restasis [Cyclosporine] Itching, Rash and Other (See Comments)    Eyes burn and rash develops around the eyes    Past Medical History:  Diagnosis Date  .  Colon polyps   . Depression 05/10/2013  . Diabetes mellitus without complication ([place])   . 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 Diverticulosis   . Headache   . Hypertension   . Irritable bowel   . 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 Neuropathy   . Neuropathy associated with endocrine disorder ([place])   . Obesity (BMI 30-39.9)   . Osteopenia   . Visual disturbance     Past Surgical History:  Procedure Laterality Date  . arm surgery Right   . plantar faci      Family History  Problem Relation Age of Onset  . Diabetes type II Mother   . Stroke Mother   . Hypertension Mother   . Heart disease Father   . Hypertension Father   . Diabetes Father   . Heart attack Father   . Heart disease Brother   . Hypertension Brother   . Diabetes Brother   . Kidney disease Brother   . Diabetes Sister   . Asthma Maternal Grandmother   . Hypertension Maternal Grandmother   . Heart attack Maternal Grandmother   . Heart attack Maternal Grandfather   . Heart attack Paternal Grandmother   . Diabetes Paternal Grandfather   . Heart attack Paternal Grandfather   . Diabetes Brother   . Heart attack Brother   . Diabetes Brother   . Diabetes Brother   . Other Brother        Bright's Disease  . Leukemia Other        brother's grandson  . Breast cancer Neg Hx     Social History:  reports that  she has never smoked. She has never used smokeless tobacco. She reports that she does not drink alcohol or use drugs.    Review of Systems    She is now on methotrexate injections from her ophthalmologist for unknown inflammatory disease Her vision is improving       Lipids: These have been controlled with Lipitor 20 mg daily as of 11/18       Lab Results  Component Value Date   CHOL 159 12/25/2017   HDL 62.40 12/25/2017   LDLCALC 75 12/25/2017   TRIG 111.0 12/25/2017   CHOLHDL 3 12/25/2017                  The blood pressure has been high for several years treated by  PCP  Blood pressure is controlled with taking 10 mg lisinopril    BP Readings from Last 3 Encounters:  08/11/18 110/76  07/07/18 118/70  12/25/17 128/80        She has a long history of Numbness, pain, tingling and burning in feet    Symptoms relieved with  gabapentin to 600 mg 3 times a day Taking tramadol as needed also    Foot exam in 7/18 showed decreased monofilament sensation  Physical Examination:  BP 110/76 (BP Location: Left Arm, Patient Position: Sitting, Cuff Size: Large)   Pulse 84   Ht _0  (1.676 m)   Wt 231 lb 3.2 oz (104.9 kg)   SpO2 96%   BMI 37.32 kg/m    ASSESSMENT:  Diabetes type 2, insulin requiring with obesity  She is on a regimen of TRESIBA 26 units daily, Ozempic 0.5   metformin 2 g and Farxiga  See history of present illness for detailed discussion of current diabetes management, blood sugar patterns and problems identified  Her A1c is 8% in December  She is now on Humalog for controlling postprandial readings at dinnertime Her blood sugar patterns were assessed from blood sugar meter download and results discussed with patient  She is able to get somewhat better results but blood sugars vary on her carbohydrate intake She is also not as depressed and is trying better to improve her lifestyle Her weight has not gone up any further As before did not have very many fasting readings to assess her basal insulin  Hypertension: Consistently well controlled  Hyperlipidemia: Needs follow-up labs on the next visit  PLAN:   She will need to adjust her evening Humalog based on amount of carbohydrate and the dose will be 6-10 units and this was discussed in detail She will start with 4 units at breakfast also as her blood sugars are likely higher in the morning also To check readings after lunch more often also Encouraged her to continue working on weight loss Blood sugar targets were discussed and given written instructions  No change in  [place] unless morning sugars stay high Continue all other medications for diabetes and recheck A1c on the next visit  Have recommended influenza vaccine which she is asking about even with her taking methotrexate  Counseling time on subjects discussed in assessment and plan sections is over 50% of today's 25 minute visit   Patient Instructions  Take 6-10 Humalog at supper based on type of meal and Carbs  Take 4 Humalog at Bfst  Check blood sugars on waking up 2-3 days a week  Also check blood sugars about 2 hours after meals and do this after different meals by rotation  Recommended blood sugar levels  on waking up are 90-130 and about 2 hours after meal is 130-180  Please bring your blood sugar monitor to each visit, thank you          Elayne Snare 08/11/2018, 8:46 PM     Note: This office note was prepared with Dragon voice recognition system technology. Any transcriptional errors that result from this process are unintentional.

## 2018-08-31 ENCOUNTER — Other Ambulatory Visit: Payer: Self-pay | Admitting: Endocrinology

## 2018-10-15 ENCOUNTER — Ambulatory Visit: Payer: Managed Care, Other (non HMO) | Admitting: Endocrinology

## 2018-11-16 ENCOUNTER — Other Ambulatory Visit: Payer: Self-pay | Admitting: Endocrinology

## 2018-11-25 ENCOUNTER — Other Ambulatory Visit: Payer: Self-pay | Admitting: Endocrinology

## 2018-11-26 ENCOUNTER — Encounter: Payer: Self-pay | Admitting: Endocrinology

## 2018-11-26 ENCOUNTER — Other Ambulatory Visit: Payer: Self-pay

## 2018-11-26 ENCOUNTER — Ambulatory Visit: Payer: Managed Care, Other (non HMO) | Admitting: Endocrinology

## 2018-11-26 VITALS — BP 118/70 | HR 75 | Ht 66.0 in | Wt 221.8 lb

## 2018-11-26 DIAGNOSIS — I10 Essential (primary) hypertension: Secondary | ICD-10-CM

## 2018-11-26 DIAGNOSIS — E1142 Type 2 diabetes mellitus with diabetic polyneuropathy: Secondary | ICD-10-CM | POA: Diagnosis not present

## 2018-11-26 DIAGNOSIS — E1165 Type 2 diabetes mellitus with hyperglycemia: Secondary | ICD-10-CM

## 2018-11-26 DIAGNOSIS — E7800 Pure hypercholesterolemia, unspecified: Secondary | ICD-10-CM | POA: Diagnosis not present

## 2018-11-26 DIAGNOSIS — Z794 Long term (current) use of insulin: Secondary | ICD-10-CM | POA: Diagnosis not present

## 2018-11-26 LAB — COMPREHENSIVE METABOLIC PANEL
ALT: 46 U/L — ABNORMAL HIGH (ref 0–35)
AST: 30 U/L (ref 0–37)
Albumin: 4 g/dL (ref 3.5–5.2)
Alkaline Phosphatase: 93 U/L (ref 39–117)
BUN: 12 mg/dL (ref 6–23)
CO2: 26 mEq/L (ref 19–32)
Calcium: 9.4 mg/dL (ref 8.4–10.5)
Chloride: 103 mEq/L (ref 96–112)
Creatinine, Ser: 0.95 mg/dL (ref 0.40–1.20)
GFR: 59.63 mL/min — ABNORMAL LOW (ref >60.00)
Glucose, Bld: 127 mg/dL — ABNORMAL HIGH (ref 70–99)
Potassium: 4.2 mEq/L (ref 3.5–5.1)
Sodium: 138 mEq/L (ref 135–145)
Total Bilirubin: 0.4 mg/dL (ref 0.2–1.2)
Total Protein: 6.5 g/dL (ref 6.0–8.3)

## 2018-11-26 LAB — MICROALBUMIN / CREATININE URINE RATIO
Creatinine,U: 57.9 mg/dL
Microalb Creat Ratio: 1.2 mg/g (ref 0.0–30.0)
Microalb, Ur: <0.7 mg/dL (ref 0.0–1.9)

## 2018-11-26 LAB — LIPID PANEL
Cholesterol: 177 mg/dL (ref 0–200)
HDL: 60.7 mg/dL (ref >39.00)
LDL Cholesterol: 95 mg/dL (ref 0–99)
NonHDL: 116.02
Total CHOL/HDL Ratio: 3
Triglycerides: 104 mg/dL (ref 0.0–149.0)
VLDL: 20.8 mg/dL (ref 0.0–40.0)

## 2018-11-26 LAB — POCT GLYCOSYLATED HEMOGLOBIN (HGB A1C): Hemoglobin A1C: 7.5 % — AB (ref 4.0–5.6)

## 2018-11-26 NOTE — Patient Instructions (Signed)
Humalog 10 at supper  Tresiba 28 units and need to go to 30 if am sugars still >130  Check blood sugars on waking up 4 days a week  Also check blood sugars about 2 hours after meals and do this after different meals by rotation  Recommended blood sugar levels on waking up are 90-130 and about 2 hours after meal is 130-160  Please bring your blood sugar monitor to each visit, thank you

## 2018-11-26 NOTE — Progress Notes (Signed)
Patient ID: [name], female   DOB: 28-Sep-1955, 63 y.o.   MRN: [id number]           Reason for Appointment: Follow-up for Type 2 Diabetes  Referring physician: Kathryne Eriksson  History of Present Illness:          Diagnosis: Type 2 diabetes mellitus, date of diagnosis:  2010       Past history:  She was having symptoms of neuropathy at onset At that time she was started on metformin alone, 500 mg twice a day and this was continued unchanged until recently Apparently she was also given Onglyza about 4 years ago but details of her control at that time is not available Her A1c levels have been mild increase over the last year but she thinks her blood sugars had been generally good with only some readings up to 200. A1c was 7.1 in 1/15 and she was continued on metformin and Onglyza When her A1c was 7.9 in August she was told to double her metformin to 2 g daily She  had blood sugars of 200 or more prior to her initial consultation  Her Trulicity was changed to Victoza because of diarrhea and abdominal pain and has been able to tolerate Victoza  1.8 hs  When A1c was 7.6 she was started on Invokana 100 mg in 11/16, this was subsequently increased when her A1c was 7.5 in 10/17 She has been on insulin since 12/2016  Recent history:   INSULIN regimen: Tresiba  26 units daily in am, Humalog 4 acb, 8 at suppertime  Non-insulin hypoglycemic drugs the patient is taking are:  metformin ER 2 g daily, Farxiga 10 mg daily, Ozempic 0.5 mg weekly     Her A1c last was 8 and now 7.5, has been as low as 6.5  Current blood sugar patterns and problems identified:  She has brought her monitor today  Since she has had late evening working hours and stress she has not been focusing on monitoring her blood sugars much  She has been told to start Humalog at breakfast on her last visit but has not done any readings after eating  FASTING blood sugars have been checked and she has only 1 recent reading of  153  Blood sugars after dinner are fluctuating and still overall higher than target  She says she is not able to focus on her diet is probably doing more stress eating  Also she has not been able to walk for various reasons  Despite this however she has lost weight since her last visit  She is not adjusting her mealtime insulin based on portions or total caloric intake or carbohydrates   Side effects from medications have been: Diarrhea, abdominal pain with Trulicity Compliance with the medical regimen: Good  Glucose monitoring:  done less than once  a day         Glucometer: Contour       Blood Glucose readings from download   PRE-MEAL Fasting Lunch Dinner Bedtime Overall  Glucose range:  153   140-149    Mean/median:     178   POST-MEAL PC Breakfast PC Lunch PC Dinner  Glucose range:    153-248  Mean/median:    191   Previous readings:  PRE-MEAL Fasting Lunch Dinner Bedtime Overall  Glucose range:  134      Mean/median:        POST-MEAL PC Breakfast PC Lunch PC Dinner  Glucose range:  225  95-219  Mean/median:    155     Self-care: The diet that the patient has been following is: tries to limit carbohydrates .     Meals: 3 meals per day. Breakfast is yogurt/ cereal/eggs with toast at 9-10 am; , lunch is a sandwich, and dinner at 5 pm will have baked chicken, starch and vegetables.  Has snacks with yogurt, cottage cheese, fruit, nuts and  rice cakes            Exercise: not walking recently    Dietician visit, most recent: 9/15              Weight history:  Wt Readings from Last 3 Encounters:  11/26/18 221 lb 12.8 oz (100.6 kg)  08/11/18 231 lb 3.2 oz (104.9 kg)  07/07/18 232 lb 9.6 oz (105.5 kg)    Glycemic control:     Lab Results  Component Value Date   HGBA1C 7.5 (A) 11/26/2018   HGBA1C 8.0 (A) 07/07/2018   HGBA1C 6.7 (A) 12/25/2017   Lab Results  Component Value Date   MICROALBUR <0.7 09/24/2017   LDLCALC 75 12/25/2017   CREATININE 0.87  07/07/2018       Allergies as of 11/26/2018      Reactions   Bisoprolol-hydrochlorothiazide Swelling, Other (See Comments)   Face and throat became swollen; wheezing also   Relafen [nabumetone] Hives, Shortness Of Breath   Aspirin Other (See Comments)   Irritates patient's IBS and Diverticulitis   Dulaglutide Other (See Comments)   Trulicity: Abdominal pain   Other Other (See Comments)   Seasonal allergies: Runny nose/eyes and congestion   Restasis [cyclosporine] Itching, Rash, Other (See Comments)   Eyes burn and rash develops around the eyes      Medication List       Accurate as of Nov 26, 2018  1:35 PM. If you have any questions, ask your nurse or doctor.        acetaminophen 650 MG CR tablet Commonly known as:  TYLENOL Take 650-1,300 mg by mouth every 8 (eight) hours as needed (for pain or headaches).   albuterol 108 (90 Base) MCG/ACT inhaler Commonly known as:  VENTOLIN HFA Inhale into the lungs.   ProAir HFA 108 (90 Base) MCG/ACT inhaler Generic drug:  albuterol Inhale 2 puffs into the lungs every 6 (six) hours as needed.   atorvastatin 20 MG tablet Commonly known as:  LIPITOR Take 1 tablet (20 mg total) by mouth daily.   Bayer Contour Next Monitor w/Device Kit Use to check blood sugar 2 times per day dx code E11.49   Bayer Microlet Lancets lancets Use as instructed to check blood sugar 2 times per day dx code E11.49   butalbital-acetaminophen-caffeine 50-325-40 MG tablet Commonly known as:  FIORICET Take 1 tablet by mouth every 6 (six) hours as needed for headache. Do not refill in less than 30 days.   Contour Next Test test strip Generic drug:  glucose blood TEST 2 TIMES A DAY E11.49   Farxiga 10 MG Tabs tablet Generic drug:  dapagliflozin propanediol TAKE 1 TABLET BY MOUTH EVERY DAY   gabapentin 600 MG tablet Commonly known as:  NEURONTIN TAKE 1 TABLET 3 TIMES PER DAY AS NEEDED What changed:  See the new instructions.   insulin degludec 100  UNIT/ML Sopn FlexTouch Pen Commonly known as:  Tyler Aas FlexTouch Inject 0.26 mLs (26 Units total) into the skin daily after breakfast.   insulin lispro 100 UNIT/ML KwikPen Commonly known as:  HumaLOG  KwikPen 8 units before meals   Insulin Pen Needle 32G X 4 MM Misc Use one per day to inject victoza   lisinopril 10 MG tablet Commonly known as:  ZESTRIL TAKE 1 TABLET BY MOUTH EVERY DAY   metFORMIN 500 MG 24 hr tablet Commonly known as:  GLUCOPHAGE-XR TAKE 2 TABLETS BY MOUTH TWO TIMES A DAY   methotrexate 2.5 MG tablet Commonly known as:  RHEUMATREX Take 2.5 mg by mouth once a week. TAKE 6 TABLETS BY MOUTH AT ONE TIME EVERY Monday.   nitroGLYCERIN 0.4 MG SL tablet Commonly known as:  NITROSTAT Place 1 tablet (0.4 mg total) under the tongue every 5 (five) minutes as needed for chest pain.   nortriptyline 25 MG capsule Commonly known as:  PAMELOR Take 2 capsules (50 mg total) by mouth at bedtime.   Ozempic (1 MG/DOSE) 2 MG/1.5ML Sopn Generic drug:  Semaglutide (1 MG/DOSE) INJECT 1 MG INTO THE SKIN ONCE A WEEK.   Venlafaxine HCl 225 MG Tb24 Take 225 mg by mouth daily.       Allergies:  Allergies  Allergen Reactions   Bisoprolol-Hydrochlorothiazide Swelling and Other (See Comments)    Face and throat became swollen; wheezing also   Relafen [Nabumetone] Hives and Shortness Of Breath   Aspirin Other (See Comments)    Irritates patient's IBS and Diverticulitis   Dulaglutide Other (See Comments)    Trulicity: Abdominal pain   Other Other (See Comments)    Seasonal allergies: Runny nose/eyes and congestion   Restasis [Cyclosporine] Itching, Rash and Other (See Comments)    Eyes burn and rash develops around the eyes    Past Medical History:  Diagnosis Date   Colon polyps    Depression 05/10/2013   Diabetes mellitus without complication ([place])    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[place])    Diverticulosis    Headache    Hypertension    Irritable bowel     Kidney disease, chronic, stage II (GFR 60-89 ml/min)    Neuropathy    Neuropathy associated with endocrine disorder ([place])    Obesity (BMI 30-39.9)    Osteopenia    Visual disturbance     Past Surgical History:  Procedure Laterality Date   arm surgery Right    plantar faci      Family History  Problem Relation Age of Onset   Diabetes type II Mother    Stroke Mother    Hypertension Mother    Heart disease Father    Hypertension Father    Diabetes Father    Heart attack Father    Heart disease Brother    Hypertension Brother    Diabetes Brother    Kidney disease Brother    Diabetes Sister    Asthma Maternal Grandmother    Hypertension Maternal Grandmother    Heart attack Maternal Grandmother    Heart attack Maternal Grandfather    Heart attack Paternal Grandmother    Diabetes Paternal Grandfather    Heart attack Paternal Grandfather    Diabetes Brother    Heart attack Brother    Diabetes Brother    Diabetes Brother    Other Brother        Bright's Disease   Leukemia Other        brother's grandson   Breast cancer Neg Hx     Social History:  reports that she has never smoked. She has never used smokeless tobacco. She reports that she does not drink alcohol or use drugs.  Review of Systems    She is on methotrexate tablets now from her ophthalmologist for unknown inflammatory disease Her vision is better       Lipids: These have been controlled with Lipitor 20 mg daily as of 6/19, has not had follow-up with PCP       Lab Results  Component Value Date   CHOL 159 12/25/2017   HDL 62.40 12/25/2017   LDLCALC 75 12/25/2017   TRIG 111.0 12/25/2017   CHOLHDL 3 12/25/2017                  The blood pressure has been high for several years treated by PCP  Blood pressure is controlled with taking 10 mg lisinopril    BP Readings from Last 3 Encounters:  11/26/18 118/70  08/11/18 110/76  07/07/18 118/70        She has a  long history of Numbness, pain, tingling and burning in feet    Symptoms being treated with  gabapentin to 600 mg 3 times a day She does not think she has adequate relief of her discomfort and worse at night Previously on nortriptyline which caused daytime drowsiness and she is not taking this Also with previously high doses of venlafaxine she had better relief of her neuropathy but she has been told to take only 150 mg   Foot exam in 5/20 showed decreased monofilament sensation in the toes  Physical Examination:  BP 118/70 (BP Location: Left Arm, Patient Position: Sitting, Cuff Size: Normal)    Pulse 75    Ht _0  (1.676 m)    Wt 221 lb 12.8 oz (100.6 kg)    SpO2 97%    BMI 35.80 kg/m   Diabetic Foot Exam - Simple   Simple Foot Form Diabetic Foot exam was performed with the following findings:  Yes   Visual Inspection No deformities, no ulcerations, no other skin breakdown bilaterally:  Yes Sensation Testing See comments:  Yes Pulse Check See comments:  Yes Comments Right pedal pulses not palpable Decreased monofilament sensation in the distal toes bilaterally     ASSESSMENT:  Diabetes type 2, insulin requiring with obesity  She is on a regimen of TRESIBA 26 units daily, Humalog twice daily, Ozempic 0.5 weekly,   metformin 2 g and Farxiga 10 mg  See history of present illness for detailed discussion of current diabetes management, blood sugar patterns and problems identified  Her A1c which was 8% in December is now 7.5  She is probably having better blood sugar control overall with losing weight However recently has not done well with her diet consistently and has had blood sugars after dinner as high as 248 Also is not monitoring her blood sugars as directed and has only 1 fasting reading Recently has not been exercising partly because of lack of motivation and also not being able to go to the gym  Again discussed day-to-day management of her diabetes including meal  planning, insulin adjustment based on blood sugar patterns and meal size, frequency and targets of blood sugars being tested, need for regular exercise, moderating on portions and carbohydrates  NEUROPATHY: She has significant neuropathy with mild stable sensory loss However painful symptoms and burning are not controlled with gabapentin 600 mg 3 times daily Although she may be a candidate for Cymbalta she is already on Effexor for depression Did not tolerate nortriptyline  Hypertension: Consistently well controlled  Hyperlipidemia: Needs follow-up labs today, currently on Lipitor 20 mg  PLAN:  She will need to start checking her blood sugars much more regularly and discussed importance of doing this For now we will at least increase her Tresiba by 2 units to get her morning sugars below 130 If needed she can go up to 30 units also Written instructions given for blood sugar targets  Also need to check sugars after breakfast and if eating lunch to consider increasing the doses of Humalog Most likely she will need to take 10 units of Humalog for average meals and up to 12 for larger meals In the meantime she needs to address her problems with depression and ask her PCP to increase her Effexor This may help her dietary compliance She will start going to the [place] locally to walk for exercise  She will be discussing her dose of Effexor with PCP and likely at the higher dose will help her neuropathy also otherwise consider Cymbalta Check chemistry panel today Recheck lipids   Counseling time on subjects discussed in assessment and plan sections is over 50% of today's 25 minute visit   There are no Patient Instructions on file for this visit.       Elayne Snare 11/26/2018, 1:35 PM     Note: This office note was prepared with Dragon voice recognition system technology. Any transcriptional errors that result from this process are unintentional.

## 2018-11-28 ENCOUNTER — Telehealth: Payer: Self-pay

## 2018-11-28 NOTE — Telephone Encounter (Signed)
PA submitted via [email or web address] for Ozempic 1mg  once weekly injection. (Key: [id number]) Rx #: [id number] Ozempic (1 MG/DOSE) 2MG /1.5ML pen-injectors   Form Librarian, academic PA Form

## 2018-12-03 ENCOUNTER — Other Ambulatory Visit: Payer: Self-pay | Admitting: Endocrinology

## 2018-12-04 ENCOUNTER — Other Ambulatory Visit: Payer: Self-pay

## 2018-12-04 MED ORDER — SEMAGLUTIDE (1 MG/DOSE) 2 MG/1.5ML ~~LOC~~ SOPN: 1.0000 mg | PEN_INJECTOR | SUBCUTANEOUS | 2 refills | Status: DC

## 2018-12-10 ENCOUNTER — Other Ambulatory Visit: Payer: Self-pay

## 2018-12-10 MED ORDER — INSULIN DEGLUDEC 100 UNIT/ML ~~LOC~~ SOPN: PEN_INJECTOR | SUBCUTANEOUS | 1 refills | Status: DC

## 2018-12-11 ENCOUNTER — Other Ambulatory Visit: Payer: Self-pay | Admitting: Gastroenterology

## 2018-12-11 ENCOUNTER — Other Ambulatory Visit (HOSPITAL_COMMUNITY): Payer: Self-pay | Admitting: Gastroenterology

## 2018-12-11 DIAGNOSIS — R1011 Right upper quadrant pain: Secondary | ICD-10-CM

## 2018-12-30 ENCOUNTER — Ambulatory Visit (HOSPITAL_COMMUNITY)
Admission: RE | Admit: 2018-12-30 | Discharge: 2018-12-30 | Disposition: A | Discharge status: Home / Self Care | Payer: Managed Care, Other (non HMO) | Source: Ambulatory Visit | Attending: Gastroenterology | Admitting: Gastroenterology

## 2018-12-30 ENCOUNTER — Encounter (HOSPITAL_COMMUNITY)
Admission: RE | Admit: 2018-12-30 | Discharge: 2018-12-30 | Disposition: A | Discharge status: Home / Self Care | Payer: Managed Care, Other (non HMO) | Source: Ambulatory Visit | Attending: Gastroenterology | Admitting: Gastroenterology

## 2018-12-30 ENCOUNTER — Other Ambulatory Visit: Payer: Self-pay

## 2018-12-30 DIAGNOSIS — R1011 Right upper quadrant pain: Secondary | ICD-10-CM | POA: Diagnosis present

## 2018-12-30 MED ORDER — TECHNETIUM TC 99M MEBROFENIN IV KIT
5.0000 | PACK | Freq: Once | INTRAVENOUS | Status: AC | PRN
  Administered 2018-12-30: 09:00:00 5 via INTRAVENOUS

## 2019-01-19 ENCOUNTER — Encounter: Payer: Self-pay | Admitting: Endocrinology

## 2019-01-20 ENCOUNTER — Other Ambulatory Visit: Payer: Self-pay | Admitting: Endocrinology

## 2019-02-25 ENCOUNTER — Ambulatory Visit: Payer: Managed Care, Other (non HMO) | Admitting: Endocrinology

## 2019-02-25 ENCOUNTER — Other Ambulatory Visit: Payer: Self-pay | Admitting: Endocrinology

## 2019-02-27 ENCOUNTER — Ambulatory Visit: Payer: Managed Care, Other (non HMO) | Admitting: Endocrinology

## 2019-03-02 ENCOUNTER — Encounter: Payer: Self-pay | Admitting: Endocrinology

## 2019-03-02 ENCOUNTER — Ambulatory Visit: Payer: Managed Care, Other (non HMO) | Admitting: Endocrinology

## 2019-03-02 ENCOUNTER — Other Ambulatory Visit: Payer: Self-pay

## 2019-03-02 VITALS — BP 112/70 | HR 83 | Ht 66.0 in | Wt 216.6 lb

## 2019-03-02 DIAGNOSIS — Z794 Long term (current) use of insulin: Secondary | ICD-10-CM

## 2019-03-02 DIAGNOSIS — E1165 Type 2 diabetes mellitus with hyperglycemia: Secondary | ICD-10-CM

## 2019-03-02 LAB — POCT GLYCOSYLATED HEMOGLOBIN (HGB A1C): Hemoglobin A1C: 6.9 % — AB (ref 4.0–5.6)

## 2019-03-02 NOTE — Patient Instructions (Addendum)
Tresiba 30 units  Humalog 6-12 at supper based on Carbs and meal size  Check blood sugars on waking up 3 days a week  Also check blood sugars about 2 hours after meals and do this after different meals by rotation  Recommended blood sugar levels on waking up are 90-130 and about 2 hours after meal is 130-160  Please bring your blood sugar monitor to each visit, thank you

## 2019-03-02 NOTE — Progress Notes (Signed)
Patient ID: [name], female   DOB: June 07, 1956, 63 y.o.   MRN: [id number]           Reason for Appointment: Follow-up for Type 2 Diabetes  Referring physician: Kathryne Eriksson  History of Present Illness:          Diagnosis: Type 2 diabetes mellitus, date of diagnosis:  2010       Past history:  She was having symptoms of neuropathy at onset At that time she was started on metformin alone, 500 mg twice a day and this was continued unchanged until recently Apparently she was also given Onglyza about 4 years ago but details of her control at that time is not available Her A1c levels have been mild increase over the last year but she thinks her blood sugars had been generally good with only some readings up to 200. A1c was 7.1 in 1/15 and she was continued on metformin and Onglyza When her A1c was 7.9 in August she was told to double her metformin to 2 g daily She  had blood sugars of 200 or more prior to her initial consultation  Her Trulicity was changed to Victoza because of diarrhea and abdominal pain and has been able to tolerate Victoza  1.8 hs  When A1c was 7.6 she was started on Invokana 100 mg in 11/16, this was subsequently increased when her A1c was 7.5 in 10/17 She has been on insulin since 12/2016  Recent history:   INSULIN regimen: Tresiba  28 units daily in am, Humalog 0-4 acb, 8 at suppertime  Non-insulin hypoglycemic drugs the patient is taking are:  metformin ER 2 g daily, Farxiga 10 mg daily, Ozempic 0.5 mg weekly     Her A1c last was 7.5, now is better at 6.9  Current blood sugar patterns and problems identified:  She has not been able to do much physical activity because of a meniscus tear in her knee  As before she is not checking her FASTING readings as directed  She thinks her home fasting readings are mostly about 130-140s but today was 183  She thinks sometimes blood sugars are higher from stress  Blood sugars are quite variable after dinner based on  what she is eating; if she is eating salads blood sugar may be as low as 102 but if she is eating more carbohydrate they will be over 200  She does not understand the need to adjust her mealtime dose based on what she is eating at dinnertime  Although she has started doing some Humalog at breakfast she is not doing that consistently even though sometimes she will have carbohydrate in the morning including cereal  However she is overall cutting back on portions and calories and is able to lose some weight, Down 5 pounds  No difficulties with taking Farxiga and Ozempic also   Side effects from medications have been: Diarrhea, abdominal pain with Trulicity Compliance with the medical regimen: Good  Glucose monitoring:  done less than once  a day         Glucometer: Contour        Blood Glucose readings from download   PRE-MEAL Fasting Lunch Dinner Bedtime Overall  Glucose range:       Mean/median:  183     164   POST-MEAL PC Breakfast PC Lunch PC Dinner  Glucose range:   102-246  102-264  Mean/median:   179  162   PREVIOUS readings:  PRE-MEAL Fasting Lunch Dinner Bedtime  Overall  Glucose range:  153   140-149    Mean/median:     178   POST-MEAL PC Breakfast PC Lunch PC Dinner  Glucose range:    153-248  Mean/median:    191      Self-care: The diet that the patient has been following is: tries to limit carbohydrates .     Meals: 3 meals per day. Breakfast is yogurt/ cereal/eggs with toast at 9-10 am; , lunch is a sandwich, and dinner at 5 pm will have baked chicken, starch and vegetables.  Has snacks with yogurt, cottage cheese, fruit, nuts and  rice cakes               Dietician visit, most recent: 9/15              Weight history:  Wt Readings from Last 3 Encounters:  03/02/19 216 lb 9.6 oz (98.2 kg)  11/26/18 221 lb 12.8 oz (100.6 kg)  08/11/18 231 lb 3.2 oz (104.9 kg)    Glycemic control:     Lab Results  Component Value Date   HGBA1C 6.9 (A) 03/02/2019    HGBA1C 7.5 (A) 11/26/2018   HGBA1C 8.0 (A) 07/07/2018   Lab Results  Component Value Date   MICROALBUR <0.7 11/26/2018   [place] 95 11/26/2018   CREATININE 0.95 11/26/2018       Allergies as of 03/02/2019      Reactions   Bisoprolol-hydrochlorothiazide Swelling, Other (See Comments)   Face and throat became swollen; wheezing also   Relafen [nabumetone] Hives, Shortness Of Breath   Aspirin Other (See Comments)   Irritates patient's IBS and Diverticulitis   Dulaglutide Other (See Comments)   Trulicity: Abdominal pain   Other Other (See Comments)   Seasonal allergies: Runny nose/eyes and congestion   Restasis [cyclosporine] Itching, Rash, Other (See Comments)   Eyes burn and rash develops around the eyes      Medication List       Accurate as of March 02, 2019  8:57 PM. If you have any questions, ask your nurse or doctor.        acetaminophen 650 MG CR tablet Commonly known as: TYLENOL Take 650-1,300 mg by mouth every 8 (eight) hours as needed (for pain or headaches).   albuterol 108 (90 Base) MCG/ACT inhaler Commonly known as: VENTOLIN HFA Inhale into the lungs.   ProAir HFA 108 (90 Base) MCG/ACT inhaler Generic drug: albuterol Inhale 2 puffs into the lungs every 6 (six) hours as needed.   atorvastatin 20 MG tablet Commonly known as: LIPITOR Take 1 tablet (20 mg total) by mouth daily.   Bayer Contour Next Monitor w/Device Kit Use to check blood sugar 2 times per day dx code E11.49   Bayer Microlet Lancets lancets Use as instructed to check blood sugar 2 times per day dx code E11.49   butalbital-acetaminophen-caffeine 50-325-40 MG tablet Commonly known as: FIORICET Take 1 tablet by mouth every 6 (six) hours as needed for headache. Do not refill in less than 30 days.   Contour Next Test test strip Generic drug: glucose blood TEST 2 TIMES A DAY E11.49   Farxiga 10 MG Tabs tablet Generic drug: dapagliflozin propanediol TAKE 1 TABLET BY MOUTH EVERY DAY    gabapentin 600 MG tablet Commonly known as: NEURONTIN TAKE 1 TABLET 3 TIMES PER DAY AS NEEDED What changed: See the new instructions.   insulin degludec 100 UNIT/ML Sopn FlexTouch Pen Commonly known as: [place] FlexTouch Inject 28 units under  the skin once daily.   insulin lispro 100 UNIT/ML KwikPen Commonly known as: HumaLOG KwikPen 8 units before meals   Insulin Pen Needle 32G X 4 MM Misc Use one per day to inject victoza   lisinopril 10 MG tablet Commonly known as: ZESTRIL TAKE 1 TABLET BY MOUTH EVERY DAY   metFORMIN 500 MG 24 hr tablet Commonly known as: GLUCOPHAGE-XR TAKE 2 TABLETS BY MOUTH TWO TIMES A DAY   methotrexate 2.5 MG tablet Commonly known as: RHEUMATREX Take 2.5 mg by mouth once a week. TAKE 6 TABLETS BY MOUTH AT ONE TIME EVERY Monday.   nitroGLYCERIN 0.4 MG SL tablet Commonly known as: NITROSTAT Place 1 tablet (0.4 mg total) under the tongue every 5 (five) minutes as needed for chest pain.   Semaglutide (1 MG/DOSE) 2 MG/1.5ML Sopn Commonly known as: Ozempic (1 MG/DOSE) Inject 1 mg into the skin once a week.   Venlafaxine HCl 225 MG Tb24 Take 225 mg by mouth daily.       Allergies:  Allergies  Allergen Reactions  . Bisoprolol-Hydrochlorothiazide Swelling and Other (See Comments)    Face and throat became swollen; wheezing also  . Relafen [Nabumetone] Hives and Shortness Of Breath  . Aspirin Other (See Comments)    Irritates patient's IBS and Diverticulitis  . Dulaglutide Other (See Comments)    Trulicity: Abdominal pain  . Other Other (See Comments)    Seasonal allergies: Runny nose/eyes and congestion  . Restasis [Cyclosporine] Itching, Rash and Other (See Comments)    Eyes burn and rash develops around the eyes    Past Medical History:  Diagnosis Date  . Colon polyps   . Depression 05/10/2013  . Diabetes mellitus without complication ([place])   . 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 Diverticulosis   . Headache   . Hypertension   .  Irritable bowel   . 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 Neuropathy   . Neuropathy associated with endocrine disorder ([place])   . Obesity (BMI 30-39.9)   . Osteopenia   . Visual disturbance     Past Surgical History:  Procedure Laterality Date  . arm surgery Right   . plantar faci      Family History  Problem Relation Age of Onset  . Diabetes type II Mother   . Stroke Mother   . Hypertension Mother   . Heart disease Father   . Hypertension Father   . Diabetes Father   . Heart attack Father   . Heart disease Brother   . Hypertension Brother   . Diabetes Brother   . Kidney disease Brother   . Diabetes Sister   . Asthma Maternal Grandmother   . Hypertension Maternal Grandmother   . Heart attack Maternal Grandmother   . Heart attack Maternal Grandfather   . Heart attack Paternal Grandmother   . Diabetes Paternal Grandfather   . Heart attack Paternal Grandfather   . Diabetes Brother   . Heart attack Brother   . Diabetes Brother   . Diabetes Brother   . Other Brother        Bright's Disease  . Leukemia Other        brother's grandson  . Breast cancer Neg Hx     Social History:  reports that she has never smoked. She has never used smokeless tobacco. She reports that she does not drink alcohol or use drugs.    Review of Systems    She is on methotrexate  tablets now from her ophthalmologist for unknown inflammatory disease Her vision is improving She has been monitored with chemistry panel and CBC every 3 months or so, last labs were normal       Lipids: These have been controlled with Lipitor 20 mg daily as of 11/2018       Lab Results  Component Value Date   CHOL 177 11/26/2018   HDL 60.70 11/26/2018   LDLCALC 95 11/26/2018   TRIG 104.0 11/26/2018   CHOLHDL 3 11/26/2018                  The blood pressure has been high for several years treated by PCP  Blood pressure is controlled with taking 10 mg lisinopril    BP Readings from  Last 3 Encounters:  03/02/19 112/70  11/26/18 118/70  08/11/18 110/76        She has a long history of Numbness, pain, tingling and burning in feet    Symptoms being treated with  gabapentin to 600 mg 3 times a day She does not think she has adequate relief of her discomfort and worse at night Previously on nortriptyline which caused daytime drowsiness and she is not taking this Also with previously high doses of venlafaxine she had better relief of her neuropathy   Foot exam in 5/20 showed decreased monofilament sensation in the toes  Physical Examination:  BP 112/70 (BP Location: Left Arm, Patient Position: Sitting, Cuff Size: Large)   Pulse 83   Ht 5' 6"  (1.676 m)   Wt 216 lb 9.6 oz (98.2 kg)   SpO2 98%   BMI 34.96 kg/m    ASSESSMENT:  Diabetes type 2, insulin requiring with obesity  She is on a regimen of TRESIBA 28 units daily, Humalog twice daily, Ozempic 0.5 weekly,   metformin 2 g and Farxiga 10 mg  See history of present illness for detailed discussion of current diabetes management, blood sugar patterns and problems identified  Her A1c which was 8% in December has improved progressively It is now 6.9  She is doing well with diet with losing weight again This is despite not being able to exercise Difficult to manage her insulin since she is not checking blood sugars fasting, after breakfast and evening blood sugars are at variable times after dinner  She does appear to have significant variability of postprandial readings based on type of meals she is eating   Hypertension: Is well controlled  She will have labs done for her chemistry panel next month with her ophthalmologist  PLAN:   Increase Tresiba to 30 units Discussed that this will need to be adjusted further based on her blood sugar reading before her first meal of the day whenever that is She does need to take pre-meal Humalog at breakfast regardless of whether she has checked her sugar and not and  go up with at least 4 units Her evening dose currently is not been adjusted and she can go up to as much as 12 units for meals with significant carbohydrate but reduce it down to 6 units when eating salads only without carbohydrate  Start back on walking regularly once she has had her knee surgery   Patient Instructions  Tresiba 30 units  Humalog 6-12 at supper based on Carbs and meal size  Check blood sugars on waking up 3 days a week  Also check blood sugars about 2 hours after meals and do this after different meals by rotation  Recommended blood  sugar levels on waking up are 90-130 and about 2 hours after meal is 130-160  Please bring your blood sugar monitor to each visit, thank you          Elayne Snare 03/02/2019, 8:57 PM     Note: This office note was prepared with Dragon voice recognition system technology. Any transcriptional errors that result from this process are unintentional.

## 2019-03-31 ENCOUNTER — Other Ambulatory Visit: Payer: Self-pay | Admitting: Endocrinology

## 2019-04-08 ENCOUNTER — Other Ambulatory Visit: Payer: Self-pay | Admitting: Endocrinology

## 2019-04-12 NOTE — Progress Notes (Signed)
[facility]  04/13/2019     CHIEF COMPLAINT Patient presents for Retina Evaluation   HISTORY OF PRESENT ILLNESS: [name] is a 63 y.o. female who presents to the clinic today for:   HPI    Retina Evaluation    In both eyes.  This started 2 years ago.  Duration of 2 years.  Associated Symptoms Floaters and Flashes.  Negative for Blind Spot, Photophobia, Scalp Tenderness, Fever, Pain, Glare, Jaw Claudication, Weight Loss, Distortion, Redness, Trauma, Shoulder/Hip pain and Fatigue.  Context:  distance vision, mid-range vision and near vision.  Treatments tried: methotrexate.  Response to treatment was moderate improvement.  I, the attending physician,  performed the HPI with the patient and updated documentation appropriately.          Comments    Patient referred by Dr. Manuella Ghazi for chorioretinal inflammation. Diagnosed about 2 years ago. On methotrexate, 6 pills once a week (15 mg total). Medicine seems to be controlling retinal inflammation per patient. IDDM for the past 10 years. BS was 154 this am. Last a1c was 6.9, checked 1 month ago. Patient has floaters and flashes OU, but these are not new. No worsening of floaters and flashes recently.        Last edited by Bernarda Caffey, MD on 04/13/2019  4:23 PM. (History)     Pt of Dr. Manuella Ghazi x 2 yrs.  Symptoms seem to be gradually improving.  Last FA with Dr. Manuella Ghazi x 1 yr.  Methotrexate changed several mos ago.  Referring physician: Feliz Beam, MD [place],  [place] [id number]  HISTORICAL INFORMATION:   Selected notes from the MEDICAL RECORD NUMBER Referred by Dr. Dwana Melena for concern of chorioretinal inflammation OU LEE: 9.15.20 Matilde Bash) [BCVA: OD: 20/30 OS: 20/25] Ocular Hx-HTN ret, retinal vaculitis PMH-DM (A1c: 6.9 [08.17.20], takes [place], Humalog, metformin),    CURRENT MEDICATIONS: No current outpatient medications on file. (Ophthalmic Drugs)   No current  facility-administered medications for this visit.  (Ophthalmic Drugs)   Current Outpatient Medications (Other)  Medication Sig  . acetaminophen (TYLENOL) 650 MG CR tablet Take 650-1,300 mg by mouth every 8 (eight) hours as needed (for pain or headaches).   Marland Kitchen atorvastatin (LIPITOR) 20 MG tablet Take 1 tablet (20 mg total) by mouth daily.  Marland Kitchen BAYER MICROLET LANCETS lancets Use as instructed to check blood sugar 2 times per day dx code E11.49  . Blood Glucose Monitoring Suppl (BAYER CONTOUR NEXT MONITOR) w/Device KIT Use to check blood sugar 2 times per day dx code E11.49  . butalbital-acetaminophen-caffeine (FIORICET, ESGIC) 50-325-40 MG tablet Take 1 tablet by mouth every 6 (six) hours as needed for headache. Do not refill in less than 30 days.  . CONTOUR NEXT TEST test strip TEST 2 TIMES A DAY E11.49  . FARXIGA 10 MG TABS tablet TAKE 1 TABLET BY MOUTH EVERY DAY  . gabapentin (NEURONTIN) 600 MG tablet TAKE 1 TABLET 3 TIMES PER DAY AS NEEDED (Patient taking differently: Take 600 mg by mouth three times a day)  . insulin degludec (TRESIBA FLEXTOUCH) 100 UNIT/ML SOPN FlexTouch Pen Inject 28 units under the skin once daily.  . insulin lispro (HUMALOG KWIKPEN) 100 UNIT/ML KwikPen INJECT 8 UNITS SUBCUTANEOUSLY BEFORE MEALS  . Insulin Pen Needle 32G X 4 MM MISC Use one per day to inject victoza  . lisinopril (ZESTRIL) 10 MG tablet TAKE 1 TABLET BY MOUTH EVERY DAY  . metFORMIN (GLUCOPHAGE-XR) 500  MG 24 hr tablet TAKE 2 TABLETS BY MOUTH TWO TIMES A DAY  . methotrexate (RHEUMATREX) 2.5 MG tablet Take 2.5 mg by mouth once a week. TAKE 6 TABLETS BY MOUTH AT ONE TIME EVERY Monday.  . nitroGLYCERIN (NITROSTAT) 0.4 MG SL tablet Place 1 tablet (0.4 mg total) under the tongue every 5 (five) minutes as needed for chest pain.  Marland Kitchen OZEMPIC, 1 MG/DOSE, 2 MG/1.5ML SOPN INJECT 1 MG INTO THE SKIN ONCE A WEEK.  Marland Kitchen PROAIR HFA 108 (90 Base) MCG/ACT inhaler Inhale 2 puffs into the lungs every 6 (six) hours as needed.  .  Venlafaxine HCl 225 MG TB24 Take 225 mg by mouth daily.  Marland Kitchen albuterol (PROVENTIL HFA;VENTOLIN HFA) 108 (90 Base) MCG/ACT inhaler Inhale into the lungs.   No current facility-administered medications for this visit.  (Other)      REVIEW OF SYSTEMS: ROS    Positive for: Endocrine, Cardiovascular, Eyes, Respiratory   Negative for: Constitutional, Gastrointestinal, Neurological, Skin, Genitourinary, Musculoskeletal, HENT, Allergic/Imm, Heme/Lymph   Last edited by Roselee Nova D, COT on 04/13/2019  2:00 PM. (History)       ALLERGIES Allergies  Allergen Reactions  . Bisoprolol-Hydrochlorothiazide Swelling and Other (See Comments)    Face and throat became swollen; wheezing also  . Relafen [Nabumetone] Hives and Shortness Of Breath  . Aspirin Other (See Comments)    Irritates patient's IBS and Diverticulitis  . Dulaglutide Other (See Comments)    Trulicity: Abdominal pain  . Other Other (See Comments)    Seasonal allergies: Runny nose/eyes and congestion  . Restasis [Cyclosporine] Itching, Rash and Other (See Comments)    Eyes burn and rash develops around the eyes    PAST MEDICAL HISTORY Past Medical History:  Diagnosis Date  . Colon polyps   . Depression 05/10/2013  . Diabetes mellitus without complication ([place])   . 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 Diverticulosis   . Headache   . Hypertension   . Irritable bowel   . 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 Neuropathy   . Neuropathy associated with endocrine disorder ([place])   . Obesity (BMI 30-39.9)   . Osteopenia   . Visual disturbance    Past Surgical History:  Procedure Laterality Date  . arm surgery Right   . plantar faci      FAMILY HISTORY Family History  Problem Relation Age of Onset  . Diabetes type II Mother   . Stroke Mother   . Hypertension Mother   . Heart disease Father   . Hypertension Father   . Diabetes Father   . Heart attack Father   . Heart disease Brother   .  Hypertension Brother   . Diabetes Brother   . Kidney disease Brother   . Diabetes Sister   . Asthma Maternal Grandmother   . Hypertension Maternal Grandmother   . Heart attack Maternal Grandmother   . Heart attack Maternal Grandfather   . Heart attack Paternal Grandmother   . Diabetes Paternal Grandfather   . Heart attack Paternal Grandfather   . Diabetes Brother   . Heart attack Brother   . Diabetes Brother   . Diabetes Brother   . Other Brother        Bright's Disease  . Leukemia Other        brother's grandson  . Breast cancer Neg Hx     SOCIAL HISTORY Social History   Tobacco Use  . Smoking status: Never Smoker  .  Smokeless tobacco: Never Used  Substance Use Topics  . Alcohol use: No  . Drug use: No         OPHTHALMIC EXAM:  Base Eye Exam    Visual Acuity (Snellen - Linear)      Right Left   Dist cc 20/30 20/25 +2   Dist ph cc 20/20 NI       Tonometry (Tonopen, 2:12 PM)      Right Left   Pressure 18 15       Pupils      Dark Light Shape React APD   Right 5 4 Round Brisk None   Left 5 4 Round Brisk None       Visual Fields (Counting fingers)      Left Right    Full Full       Extraocular Movement      Right Left    Full, Ortho Full, Ortho       Neuro/Psych    Oriented x3: Yes   Mood/Affect: Normal       Dilation    Both eyes: 1.0% Mydriacyl, 2.5% Phenylephrine @ 2:12 PM        Slit Lamp and Fundus Exam    Slit Lamp Exam      Right Left   Lids/Lashes Dermato, mild MGD Dermato, mild MGD   Conjunctiva/Sclera White and quiet White and quiet   Cornea 1-2+ inferior PEE 1-2+ inferior PEE   Anterior Chamber Deep and clear Deep and clear   Iris Round and well dilated Round and well dilated   Lens 1-2+ NS, 2+ cortical 1-2+ NS, 2+ cortical   Vitreous Synerisis; no cell or pigment Synerisis; no cell or pigment       Fundus Exam      Right Left   Disc Pink, sharp, mild temporal PPA + hyperemia; superior vascular loops   C/D Ratio 0.4  0.2   Macula flat, good foveal reflex, mild rpe mottling, no heme or edema flat, blunted foveal reflex, mild rpe mottling and clumping, no heme or edema   Vessels Attenuated and tortuous Attentuated and tortuous   Periphery Rare DBH temporal periphery, attached blot hemorrhages temporal periphery; periphery attached.  IRH and DBH (greatest temporally)        Refraction    Wearing Rx      Sphere Cylinder Axis Add   Right -6.75 +3.50 108 +2.25   Left -5.50 +2.50 077 +2.25       Manifest Refraction      Sphere Cylinder Axis Dist VA   Right -6.25 +3.25 112 20/25+2   Left -5.00 +2.25 080 20/20-1          IMAGING AND PROCEDURES  Imaging and Procedures for _0 @  OCT, Retina - OU - Both Eyes       Right Eye Quality was good. Central Foveal Thickness: 279. Progression has no prior data. Findings include normal foveal contour, no IRF, no SRF, vitreomacular adhesion .   Left Eye Quality was good. Central Foveal Thickness: 284. Progression has no prior data. Findings include normal foveal contour, no IRF, no SRF, vitreomacular adhesion .   Notes *Images captured and stored on drive  Diagnosis / Impression:  NFP, No IRF/SRF OU VMA OU  Clinical management:  See below  Abbreviations: NFP - Normal foveal profile. CME - cystoid macular edema. PED - pigment epithelial detachment. IRF - intraretinal fluid. SRF - subretinal fluid. EZ - ellipsoid zone. ERM - epiretinal membrane. ORA - outer  retinal atrophy. ORT - outer retinal tubulation. SRHM - subretinal hyper-reflective material          Fluorescein Angiography Optos (Transit OD)       Right Eye   Progression has no prior data. Early phase findings include vascular perfusion defect, microaneurysm. Mid/Late phase findings include staining, leakage (Hyperflouresence of disc; perivascular leakage temporal periphery).   Left Eye   Progression has no prior data. Early phase findings include microaneurysm, leakage,  staining. Mid/Late phase findings include leakage, staining (Hyperflouresence of disc; perivascular leakage almost 360).   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 Retinal edema  H35.81 OCT, Retina - OU - Both Eyes  4. Hypertensive retinopathy of both eyes  H35.033 Fluorescein Angiography Optos (Transit OD)  5. Diabetes mellitus type 2 without retinopathy ([place])  E11.9   6. Combined forms of age-related cataract of both eyes  H25.813     1,2. Peripheral focal chorioretinal inflammation and retinal vasculitis OU - under the expert management of Dr. Dwana Melena, Uveitis/Retina Specialist - currently on '15mg'$  po MTX wkly + 1 mg folic acid - pt reports improved symptoms since initiation of immunosuppression - BCVA 20/20 OD, 20/25+2 OS - referred here for fluorescein angiogram - FA 9.28.2020 shows persistent hyperfluorescence of disc and peripheral perivascular leakage OU (OS > OD) - will share images with Dr. Manuella Ghazi for review and comparison to prior studies - f/u here prn per Dr. Manuella Ghazi  3. No retinal edema on exam or OCT  4. Hypertensive Retinopathy of both eyes - discussed importance of tight BP control - monitor  5. Diabetes mellitus, type 2 without retinopathy - The incidence, risk factors for progression, natural history and treatment options for diabetic retinopathy  were discussed with patient.   - The need for close monitoring of blood glucose, blood pressure, and serum lipids, avoiding cigarette or any type of tobacco, and the need for long term follow up was also discussed with patient.  6. Mixed cataract OU - The symptoms of cataract, surgical options, and treatments and risks were discussed with patient. - monitor   Ophthalmic Meds Ordered this  visit:  No orders of the defined types were placed in this encounter.      Return if symptoms worsen or fail to improve.  There are no Patient Instructions on file for this visit.   Explained the diagnoses, plan, and follow up with the patient and they expressed understanding.  Patient expressed understanding of the importance of proper follow up care.   This document serves as a record of services personally performed by Gardiner Sleeper, MD, PhD. It was created on their behalf by Ernest Mallick, OA, an ophthalmic assistant. The creation of this record is the provider's dictation and/or activities during the visit.    Electronically signed by: Ernest Mallick, OA  09.27.2020 8:28 PM    Gardiner Sleeper, M.D., Ph.D. Diseases & Surgery of the Retina and Vitreous [facility]  I have reviewed the above documentation for accuracy and completeness, and I agree with the above. Gardiner Sleeper, M.D., Ph.D. 04/13/19 8:28 PM   Abbreviations: M myopia (nearsighted); A astigmatism; H hyperopia (farsighted); P presbyopia; Mrx spectacle  prescription;  CTL contact lenses; OD right eye; OS left eye; OU both eyes  XT exotropia; ET esotropia; PEK punctate epithelial keratitis; PEE punctate epithelial erosions; DES dry eye syndrome; MGD meibomian gland dysfunction; ATs artificial tears; PFAT's preservative free artificial tears; [place] nuclear sclerotic cataract; PSC posterior subcapsular cataract; ERM epi-retinal membrane; PVD posterior vitreous detachment; RD retinal detachment; DM diabetes mellitus; DR diabetic retinopathy; NPDR non-proliferative diabetic retinopathy; PDR proliferative diabetic retinopathy; CSME clinically significant macular edema; DME diabetic macular edema; dbh dot blot hemorrhages; CWS cotton wool spot; POAG primary open angle glaucoma; C/D cup-to-disc ratio; HVF humphrey visual field; GVF goldmann visual field; OCT optical coherence tomography; IOP intraocular pressure;  BRVO Branch retinal vein occlusion; CRVO central retinal vein occlusion; CRAO central retinal artery occlusion; BRAO branch retinal artery occlusion; RT retinal tear; SB scleral buckle; PPV pars plana vitrectomy; VH Vitreous hemorrhage; PRP panretinal laser photocoagulation; IVK intravitreal kenalog; VMT vitreomacular traction; MH Macular hole;  NVD neovascularization of the disc; NVE neovascularization elsewhere; AREDS age related eye disease study; ARMD age related macular degeneration; POAG primary open angle glaucoma; EBMD epithelial/anterior basement membrane dystrophy; ACIOL anterior chamber intraocular lens; IOL intraocular lens; PCIOL posterior chamber intraocular lens; Phaco/IOL phacoemulsification with intraocular lens placement; Center photorefractive keratectomy; LASIK laser assisted in situ keratomileusis; HTN hypertension; DM diabetes mellitus; COPD chronic obstructive pulmonary disease

## 2019-04-13 ENCOUNTER — Ambulatory Visit (INDEPENDENT_AMBULATORY_CARE_PROVIDER_SITE_OTHER): Payer: Managed Care, Other (non HMO) | Admitting: Ophthalmology

## 2019-04-13 ENCOUNTER — Other Ambulatory Visit: Payer: Self-pay

## 2019-04-13 ENCOUNTER — Encounter (INDEPENDENT_AMBULATORY_CARE_PROVIDER_SITE_OTHER): Payer: Self-pay | Admitting: Ophthalmology

## 2019-04-13 DIAGNOSIS — H35063 Retinal vasculitis, bilateral: Secondary | ICD-10-CM | POA: Diagnosis not present

## 2019-04-13 DIAGNOSIS — H25813 Combined forms of age-related cataract, bilateral: Secondary | ICD-10-CM

## 2019-04-13 DIAGNOSIS — E119 Type 2 diabetes mellitus without complications: Secondary | ICD-10-CM

## 2019-04-13 DIAGNOSIS — H3581 Retinal edema: Secondary | ICD-10-CM

## 2019-04-13 DIAGNOSIS — H30033 Focal chorioretinal inflammation, peripheral, bilateral: Secondary | ICD-10-CM

## 2019-04-13 DIAGNOSIS — H35033 Hypertensive retinopathy, bilateral: Secondary | ICD-10-CM

## 2019-04-30 ENCOUNTER — Other Ambulatory Visit: Payer: Self-pay | Admitting: Neurology

## 2019-05-31 ENCOUNTER — Other Ambulatory Visit: Payer: Self-pay | Admitting: Endocrinology

## 2019-06-01 ENCOUNTER — Other Ambulatory Visit: Payer: Self-pay

## 2019-06-02 ENCOUNTER — Ambulatory Visit: Payer: Managed Care, Other (non HMO) | Admitting: Endocrinology

## 2019-06-02 ENCOUNTER — Encounter: Payer: Self-pay | Admitting: Endocrinology

## 2019-06-02 VITALS — BP 128/78 | HR 76 | Ht 66.0 in | Wt 207.2 lb

## 2019-06-02 DIAGNOSIS — Z794 Long term (current) use of insulin: Secondary | ICD-10-CM

## 2019-06-02 DIAGNOSIS — E1165 Type 2 diabetes mellitus with hyperglycemia: Secondary | ICD-10-CM

## 2019-06-02 DIAGNOSIS — Z23 Encounter for immunization: Secondary | ICD-10-CM | POA: Diagnosis not present

## 2019-06-02 LAB — POCT GLYCOSYLATED HEMOGLOBIN (HGB A1C): Hemoglobin A1C: 6.3 % — AB (ref 4.0–5.6)

## 2019-06-02 NOTE — Progress Notes (Signed)
Patient ID: [name], female   DOB: 20-Jan-1956, 63 y.o.   MRN: [id number]           Reason for Appointment: Follow-up for Type 2 Diabetes  Referring physician: Kathryne Boyer  History of Present Illness:          Diagnosis: Type 2 diabetes mellitus, date of diagnosis:  2010       Past history:  She was having symptoms of neuropathy at onset At that time she was started on metformin alone, 500 mg twice a day and this was continued unchanged until recently Apparently she was also given Onglyza about 4 years ago but details of her control at that time is not available Her A1c levels have been mild increase over the last year but she thinks her blood sugars had been generally good with only some readings up to 200. A1c was 7.1 in 1/15 and she was continued on metformin and Onglyza When her A1c was 7.9 in August she was told to double her metformin to 2 g daily She  had blood sugars of 200 or more prior to her initial consultation  Her Trulicity was changed to Victoza because of diarrhea and abdominal pain and has been able to tolerate Victoza  1.8 hs  When A1c was 7.6 she was started on Invokana 100 mg in 11/16, this was subsequently increased when her A1c was 7.5 in 10/17 She has been on insulin since 12/2016  Recent history:   INSULIN regimen: Tresiba  26 units daily in am, Humalog 0  acb, 8 units at suppertime  Non-insulin hypoglycemic drugs the patient is taking are:  metformin ER 2 g daily, Farxiga 10 mg daily, Ozempic 0.5 mg weekly     Her A1c last was 7.5, now is better at 6.9  Current blood sugar patterns and problems identified:  She still has not been able to do any walking because of a meniscus tear in her knee that has been operated on  She had 1 blood sugar of 85 at one time and since she felt her blood sugars were coming down and she was losing weight she cut down her Tresiba by 2 units  This is despite her fasting blood sugars being not checked much at all and the  only 2 readings that are available are over 130  Also in the last few days her bedtime readings have been over 200 from eating snacks with carbohydrate and occasionally sweets  However overall she is cutting back on portions and has been able to lose a significant amount of weight in the last 3 months  She is regular with her [name] in the morning and Ozempic every week  She checks her blood sugars somewhat randomly late in the evening and not postprandially mostly   Dinner usually at 4-5 pm  Side effects from medications have been: Diarrhea, abdominal pain with Trulicity Compliance with the medical regimen: Good  Glucose monitoring:  done less than once  a day         Glucometer: Contour        Blood Glucose readings from download   PRE-MEAL Fasting Lunch Dinner Bedtime Overall  Glucose range:  134, 158    134-257   Mean/median:      177   Previous readings:  PRE-MEAL Fasting Lunch Dinner Bedtime Overall  Glucose range:       Mean/median:  183     164   POST-MEAL PC Breakfast PC Lunch PC Dinner  Glucose range:   102-246  102-264  Mean/median:   179  162     Self-care: The diet that the patient has been following is: tries to limit carbohydrates .     Meals: 3 meals per day. Breakfast is yogurt/ cereal/eggs with toast at 9-10 am; , lunch is a sandwich, and dinner at 5 pm will have baked chicken, starch and vegetables.  Has snacks with yogurt, cottage cheese, fruit, nuts and  rice cakes               Dietician visit, most recent: 9/15              Weight history:  Wt Readings from Last 3 Encounters:  06/02/19 207 lb 3.2 oz (94 kg)  03/02/19 216 lb 9.6 oz (98.2 kg)  11/26/18 221 lb 12.8 oz (100.6 kg)    Glycemic control:     Lab Results  Component Value Date   HGBA1C 6.3 (A) 06/02/2019   HGBA1C 6.9 (A) 03/02/2019   HGBA1C 7.5 (A) 11/26/2018   Lab Results  Component Value Date   MICROALBUR <0.7 11/26/2018   [place] 95 11/26/2018   CREATININE 0.95 11/26/2018        Allergies as of 06/02/2019      Reactions   Bisoprolol-hydrochlorothiazide Swelling, Other (See Comments)   Face and throat became swollen; wheezing also   Relafen [nabumetone] Hives, Shortness Of Breath   Aspirin Other (See Comments)   Irritates patient's IBS and Diverticulitis   Dulaglutide Other (See Comments)   Trulicity: Abdominal pain   Other Other (See Comments)   Seasonal allergies: Runny nose/eyes and congestion   Restasis [cyclosporine] Itching, Rash, Other (See Comments)   Eyes burn and rash develops around the eyes      Medication List       Accurate as of June 02, 2019  1:59 PM. If you have any questions, ask your nurse or doctor.        acetaminophen 650 MG CR tablet Commonly known as: TYLENOL Take 650-1,300 mg by mouth every 8 (eight) hours as needed (for pain or headaches).   albuterol 108 (90 Base) MCG/ACT inhaler Commonly known as: VENTOLIN HFA Inhale into the lungs.   ProAir HFA 108 (90 Base) MCG/ACT inhaler Generic drug: albuterol Inhale 2 puffs into the lungs every 6 (six) hours as needed.   atorvastatin 20 MG tablet Commonly known as: LIPITOR Take 1 tablet (20 mg total) by mouth daily.   Bayer Contour Next Monitor w/Device Kit Use to check blood sugar 2 times per day dx code E11.49   Bayer Microlet Lancets lancets Use as instructed to check blood sugar 2 times per day dx code E11.49   butalbital-acetaminophen-caffeine 50-325-40 MG tablet Commonly known as: FIORICET Take 1 tablet by mouth every 6 (six) hours as needed for headache. Do not refill in less than 30 days.   Contour Next Test test strip Generic drug: glucose blood TEST 2 TIMES A DAY E11.49   Farxiga 10 MG Tabs tablet Generic drug: dapagliflozin propanediol TAKE 1 TABLET BY MOUTH EVERY DAY   gabapentin 600 MG tablet Commonly known as: NEURONTIN TAKE 1 TABLET 3 TIMES PER DAY AS NEEDED What changed: See the new instructions.   insulin degludec 100 UNIT/ML Sopn  FlexTouch Pen Commonly known as: Tresiba FlexTouch Inject 28 units under the skin once daily.   insulin lispro 100 UNIT/ML KwikPen Commonly known as: HumaLOG KwikPen INJECT 8 UNITS SUBCUTANEOUSLY BEFORE MEALS   Insulin Pen Needle  32G X 4 MM Misc Use one per day to inject victoza   lisinopril 10 MG tablet Commonly known as: ZESTRIL TAKE 1 TABLET BY MOUTH EVERY DAY   metFORMIN 500 MG 24 hr tablet Commonly known as: GLUCOPHAGE-XR TAKE 2 TABLETS BY MOUTH TWO TIMES A DAY   methotrexate 2.5 MG tablet Commonly known as: RHEUMATREX Take 2.5 mg by mouth once a week. TAKE 6 TABLETS BY MOUTH AT ONE TIME EVERY Monday.   nitroGLYCERIN 0.4 MG SL tablet Commonly known as: NITROSTAT Place 1 tablet (0.4 mg total) under the tongue every 5 (five) minutes as needed for chest pain.   Ozempic (1 MG/DOSE) 2 MG/1.5ML Sopn Generic drug: Semaglutide (1 MG/DOSE) INJECT 1 MG INTO THE SKIN ONCE A WEEK.   Venlafaxine HCl 225 MG Tb24 Take 225 mg by mouth daily.       Allergies:  Allergies  Allergen Reactions  . Bisoprolol-Hydrochlorothiazide Swelling and Other (See Comments)    Face and throat became swollen; wheezing also  . Relafen [Nabumetone] Hives and Shortness Of Breath  . Aspirin Other (See Comments)    Irritates patient's IBS and Diverticulitis  . Dulaglutide Other (See Comments)    Trulicity: Abdominal pain  . Other Other (See Comments)    Seasonal allergies: Runny nose/eyes and congestion  . Restasis [Cyclosporine] Itching, Rash and Other (See Comments)    Eyes burn and rash develops around the eyes    Past Medical History:  Diagnosis Date  . Colon polyps   . Depression 05/10/2013  . Diabetes mellitus without complication ([place])   . 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 Diverticulosis   . Headache   . Hypertension   . Irritable bowel   . 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 Neuropathy   . Neuropathy associated with endocrine disorder ([place])   . Obesity  (BMI 30-39.9)   . Osteopenia   . Visual disturbance     Past Surgical History:  Procedure Laterality Date  . arm surgery Right   . plantar faci      Family History  Problem Relation Age of Onset  . Diabetes type II Mother   . Stroke Mother   . Hypertension Mother   . Heart disease Father   . Hypertension Father   . Diabetes Father   . Heart attack Father   . Heart disease Brother   . Hypertension Brother   . Diabetes Brother   . Kidney disease Brother   . Diabetes Sister   . Asthma Maternal Grandmother   . Hypertension Maternal Grandmother   . Heart attack Maternal Grandmother   . Heart attack Maternal Grandfather   . Heart attack Paternal Grandmother   . Diabetes Paternal Grandfather   . Heart attack Paternal Grandfather   . Diabetes Brother   . Heart attack Brother   . Diabetes Brother   . Diabetes Brother   . Other Brother        Bright's Disease  . Leukemia Other        brother's grandson  . Breast cancer Neg Hx     Social History:  reports that she has never smoked. She has never used smokeless tobacco. She reports that she does not drink alcohol or use drugs.    Review of Systems    She is on methotrexate tablets from her ophthalmologist for unknown inflammatory disease Her vision is improving She has been monitored with chemistry panel and CBC every 3 months or so,  last labs were normal       Lipids: These have been controlled with Lipitor 20 mg daily as of 11/2018       Lab Results  Component Value Date   CHOL 177 11/26/2018   HDL 60.70 11/26/2018   LDLCALC 95 11/26/2018   TRIG 104.0 11/26/2018   CHOLHDL 3 11/26/2018                  The blood pressure has been high for several years treated by PCP  Blood pressure is controlled with taking 10 mg lisinopril    BP Readings from Last 3 Encounters:  06/02/19 128/78  03/02/19 112/70  11/26/18 118/70        She has a long history of Numbness, pain, tingling and burning in feet     Symptoms being treated with  gabapentin to 600 mg 3 times a day and she has better control of symptoms as long as she does not forget her medication   Foot exam in 5/20 showed decreased monofilament sensation in the toes  Physical Examination:  BP 128/78 (BP Location: Left Arm, Patient Position: Sitting, Cuff Size: Normal)   Pulse 76   Ht 5' 6"  (1.676 m)   Wt 207 lb 3.2 oz (94 kg)   SpO2 94%   BMI 33.44 kg/m    ASSESSMENT:  Diabetes type 2, insulin requiring with obesity  She is on a regimen of TRESIBA 26 units daily, Humalog twice daily, Ozempic 0.5 weekly,   metformin 2 g and Farxiga 10 mg  See history of present illness for detailed discussion of current diabetes management, blood sugar patterns and problems identified  Her A1c which was 8% in December has improved progressively  It is now 6.3 which is the best she has had This is likely to be from her overall reduced calorie and carbohydrate intake since she is not exercising However does not appear to be needing any less insulin recently   Hypertension: Is well controlled  NEUROPATHY: Recently better controlled likely be from improved diabetes management  PLAN:   Increase Tresiba to 28 units This needs to be adjusted only if her morning sugars are outside the 90-130 target range She will need to check readings after breakfast and dinner about 2 hours later to help adjust her Humalog If she is eating a small meal or less carbohydrates she can reduce her supper dose down to 6 units For any large snacks later in the evening she will need to take about 4 units Humalog also  No change in Ozempic, since she has slightly decreased appetite overall will not increase the dose  Start back on walking regularly once she is able to do so   There are no Patient Instructions on file for this visit.   Influenza vaccine given    Elayne Snare 06/02/2019, 1:59 PM     Note: This office note was prepared with Dragon voice  recognition system technology. Any transcriptional errors that result from this process are unintentional.

## 2019-06-02 NOTE — Patient Instructions (Signed)
28 Tresiba  4 Units for large snacks  Check blood sugars on waking up 3 days a week  Also check blood sugars about 2 hours after meals and do this after different meals by rotation  Recommended blood sugar levels on waking up are 90-130 and about 2 hours after meal is 130-160  Please bring your blood sugar monitor to each visit, thank you

## 2019-06-25 ENCOUNTER — Other Ambulatory Visit: Payer: Self-pay | Admitting: Family Medicine

## 2019-06-25 DIAGNOSIS — Z1231 Encounter for screening mammogram for malignant neoplasm of breast: Secondary | ICD-10-CM

## 2019-08-02 ENCOUNTER — Other Ambulatory Visit: Payer: Self-pay | Admitting: Endocrinology

## 2019-08-03 ENCOUNTER — Telehealth: Payer: Self-pay

## 2019-08-03 NOTE — Telephone Encounter (Signed)
PA initiated via [email or web address] for Ozempic 1.0mg  weekly injection.   [name] (Key: [id number]) Rx #: [phone] Ozempic (1 MG/DOSE) 2MG /1.5ML pen-injectors   Form [employer] Electronic PA Form Created 5 hours ago Sent to Plan 1 minute ago Plan Response 1 minute ago Submit Clinical Questions less than a minute ago Determination Wait for Determination Please wait for [employer] to return a determination.  Message from [place];Review Type:Prior Auth;Coverage Start Date:07/04/2019;Coverage End Date:08/02/2022;

## 2019-08-14 ENCOUNTER — Ambulatory Visit
Admission: RE | Admit: 2019-08-14 | Discharge: 2019-08-14 | Disposition: A | Discharge status: Home / Self Care | Payer: 59 | Source: Ambulatory Visit | Attending: Family Medicine | Admitting: Family Medicine

## 2019-08-14 ENCOUNTER — Other Ambulatory Visit: Payer: Self-pay

## 2019-08-14 DIAGNOSIS — Z1231 Encounter for screening mammogram for malignant neoplasm of breast: Secondary | ICD-10-CM

## 2019-08-25 ENCOUNTER — Telehealth: Payer: Self-pay

## 2019-08-25 IMAGING — US ULTRASOUND ABDOMEN LIMITED
1 series · 14 of 25 positions shown · non-contrast
Comparison: None.

CLINICAL DATA: Right upper quadrant abdominal pain for several
months. Postprandial diarrhea.

EXAM:
ULTRASOUND ABDOMEN LIMITED RIGHT UPPER QUADRANT

[Series 1: ultrasound abdomen limited · 14 of 74 slices shown]
[im 1/74]
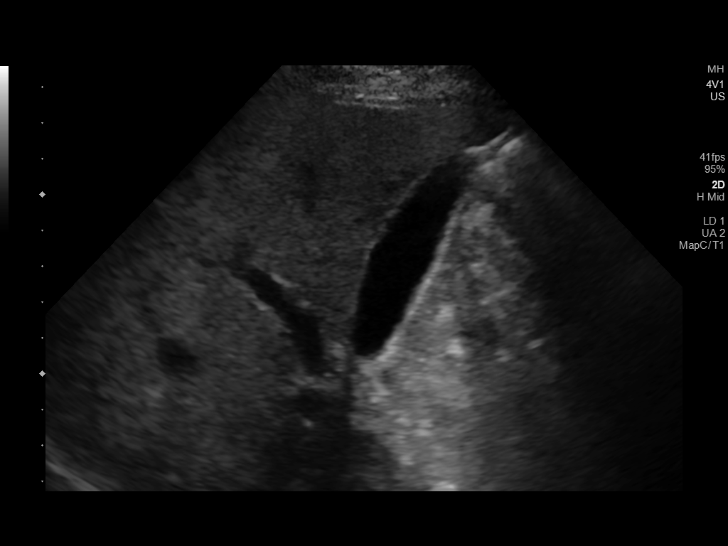
[im 7/74]
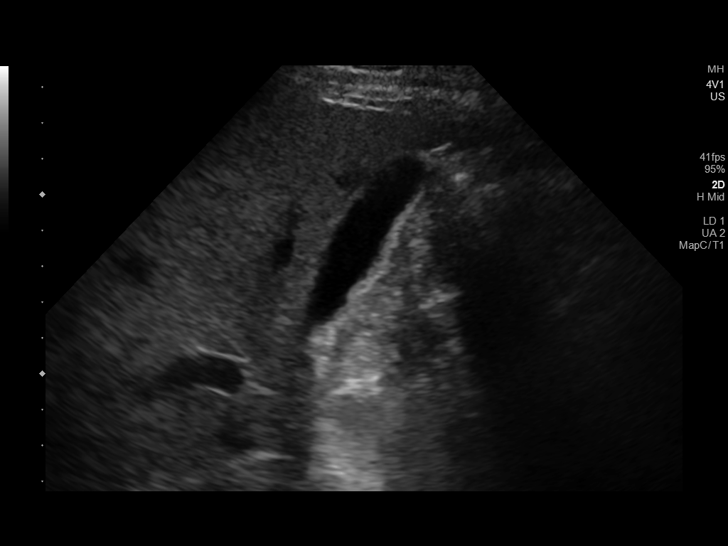
[im 13/74]
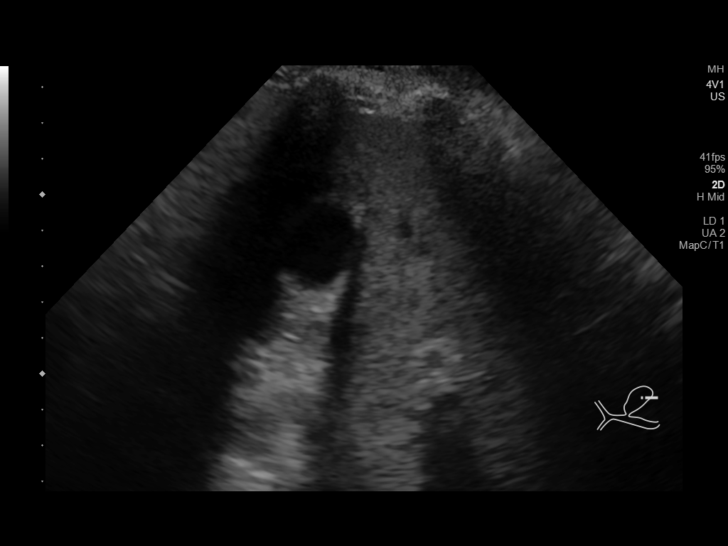
[im 19/74]
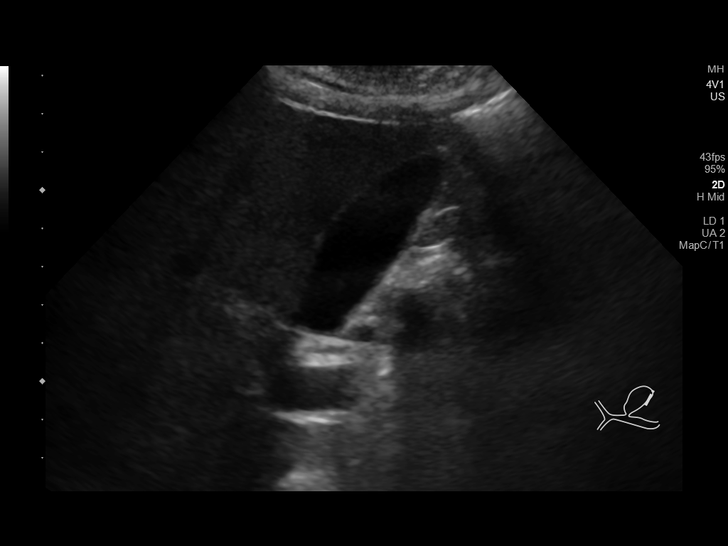
[im 25/74]
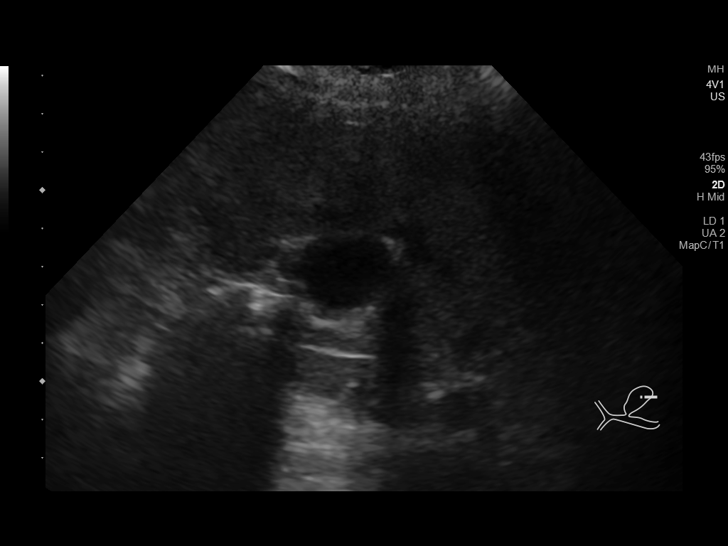
[im 28/74]
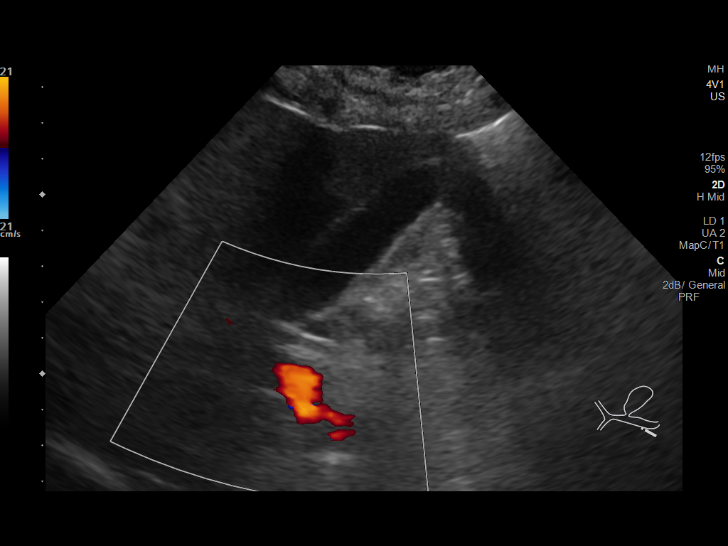
[im 34/74]
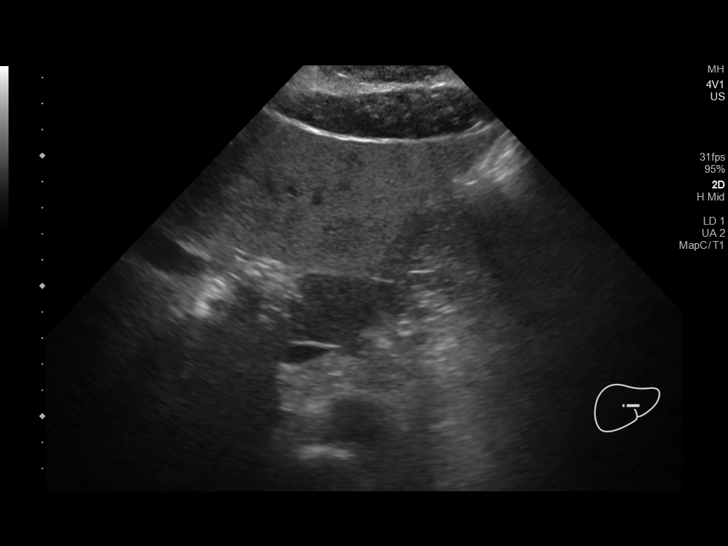
[im 40/74]
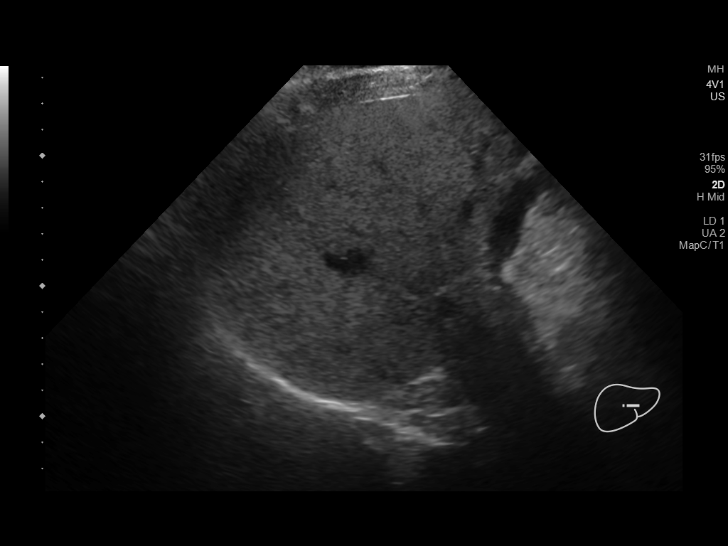
[im 46/74]
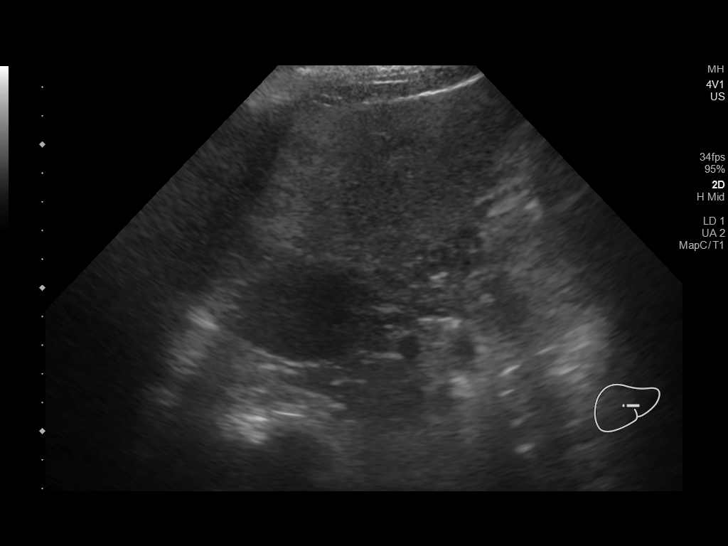
[im 49/74]
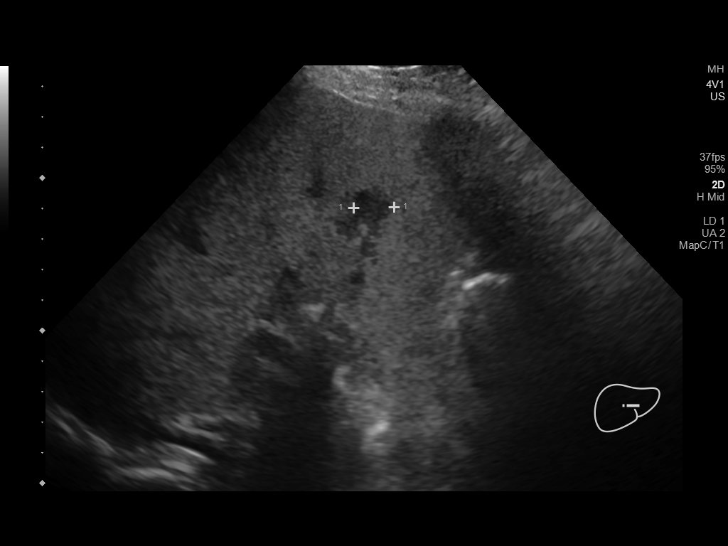
[im 55/74]
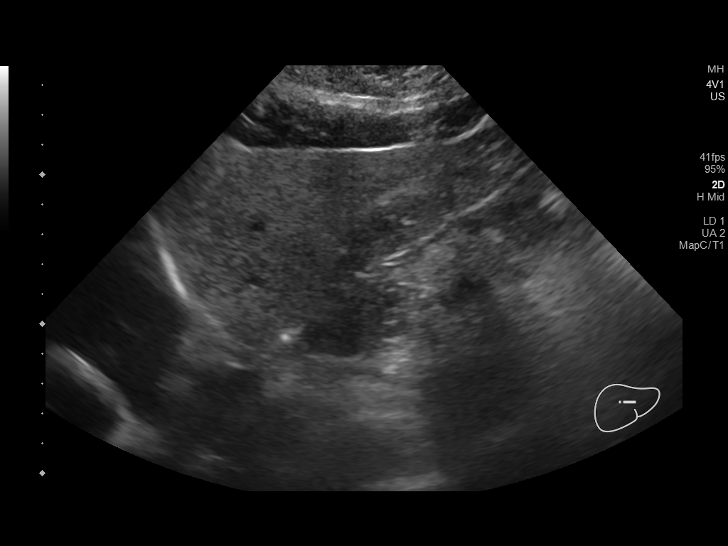
[im 61/74]
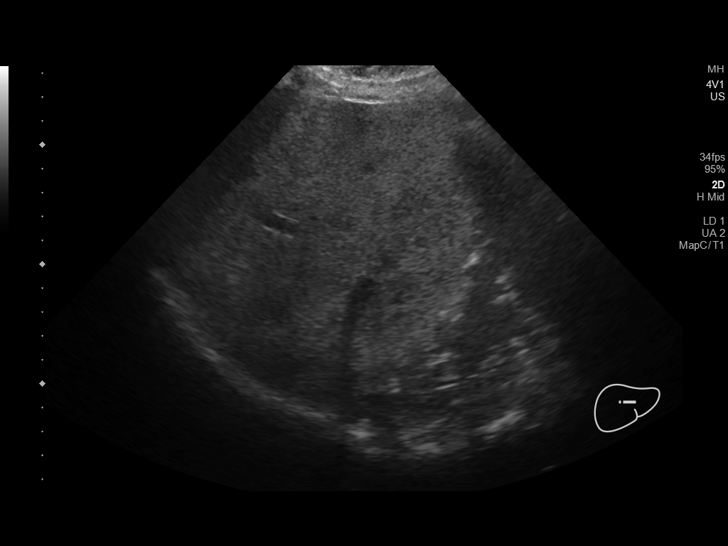
[im 67/74]
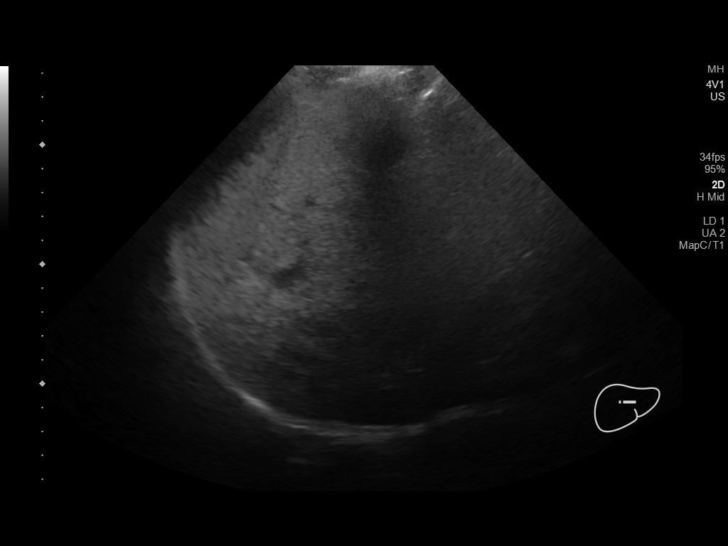
[im 74/74]
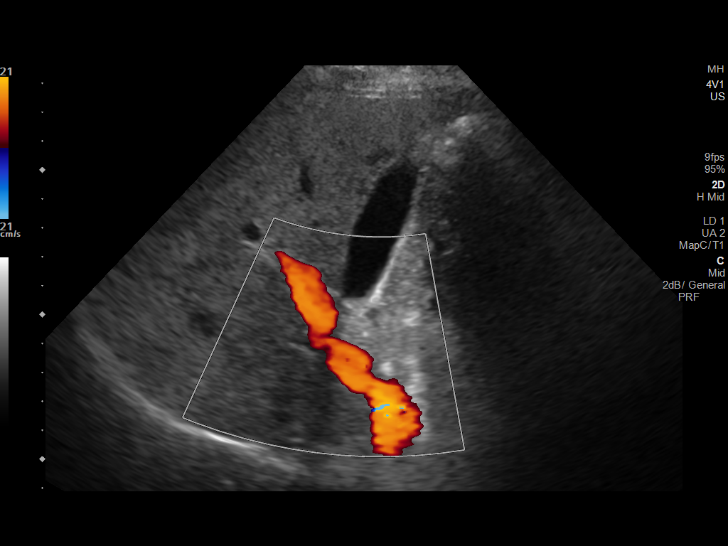

[14 of 25 positions shown; findings below may reference images not displayed]

FINDINGS: Gallbladder:

No gallstones or wall thickening visualized. No sonographic Murphy
sign noted by sonographer.

Common bile duct:

Diameter: 3.5 mm

Liver:

There is diffuse increased echogenicity of the liver and decreased
through transmission consistent with fatty infiltration. No focal
lesions or biliary dilatation. Focal fatty sparing noted near the
gallbladder. Portal vein is patent on color Doppler imaging with
normal direction of blood flow towards the liver.
IMPRESSION: 1. Normal gallbladder and normal caliber common bile duct.
2. Diffuse fatty infiltration of the liver with focal fatty sparing
near the gallbladder fossa.

## 2019-08-25 NOTE — Telephone Encounter (Signed)
PA initiated and approved via [email or web address] for [place].  [name] (Key: [name]) [name] 10MG  OR TABS   Form [employer] Electronic PA Form [phone] NCPDP) Created 4 minutes ago Sent to Plan 3 minutes ago Plan Response 3 minutes ago Submit Clinical Questions 1 minute ago Determination Favorable 1 minute ago Message from [place] Approved;Review Type:Prior Auth;Coverage Start Date:07/26/2019;Coverage End Date:08/24/2020;

## 2019-08-25 NOTE — Telephone Encounter (Signed)
Received fax from [email or web address] stating that a PA is required for [place]. Covered alternatives are as follows:  GLIPIZIDE TABS 5MG   GLIPIZIDE TABS 10MG   METFORMIN HCL TABS 500MG   METFORMIN HCL TABS 850MG   METFORMIN HCL TABS 1000MG   GLIPIZIDE ER TABS 2.5MG   GLIPIZIDE ER TABS 5MG   GLIPIZIDE ER TABS 10MG   Would you like to change to one of these or complete PA?

## 2019-08-25 NOTE — Telephone Encounter (Signed)
She has failed glipizide and Metformin.  Already taking Metformin

## 2019-09-04 ENCOUNTER — Ambulatory Visit: Payer: Managed Care, Other (non HMO) | Admitting: Endocrinology

## 2019-09-09 ENCOUNTER — Ambulatory Visit: Payer: Managed Care, Other (non HMO) | Admitting: Endocrinology

## 2019-09-22 LAB — HM DIABETES EYE EXAM

## 2019-09-23 ENCOUNTER — Other Ambulatory Visit: Payer: Self-pay | Admitting: Endocrinology

## 2019-10-08 NOTE — Progress Notes (Signed)
Patient ID: [name], female   DOB: June 26, 1956, 64 y.o.   MRN: [id number]           Reason for Appointment: Follow-up for Type 2 Diabetes  Referring physician: Kathryne Eriksson  History of Present Illness:          Diagnosis: Type 2 diabetes mellitus, date of diagnosis:  2010       Past history:  She was having symptoms of neuropathy at onset At that time she was started on metformin alone, 500 mg twice a day and this was continued unchanged until recently Apparently she was also given Onglyza about 4 years ago but details of her control at that time is not available Her A1c levels have been mild increase over the last year but she thinks her blood sugars had been generally good with only some readings up to 200. A1c was 7.1 in 1/15 and she was continued on metformin and Onglyza When her A1c was 7.9 in August she was told to double her metformin to 2 g daily She  had blood sugars of 200 or more prior to her initial consultation  Her Trulicity was changed to Victoza because of diarrhea and abdominal pain and has been able to tolerate Victoza  1.8 hs  When A1c was 7.6 she was started on Invokana 100 mg in 11/16, this was subsequently increased when her A1c was 7.5 in 10/17 She has been on insulin since 12/2016  Recent history:   INSULIN regimen: Tresiba  28 units daily in am, Humalog 3  acb, 6 units at suppertime  Non-insulin hypoglycemic drugs the patient is taking are:  metformin ER 2 g daily, Farxiga 10 mg daily, Ozempic 1 mg weekly  Her A1c is about the same at 6.5  Current blood sugar patterns and problems identified:  She has mostly checked her blood sugar the last week or so  Her blood sugars at home are averaging 163 recently and higher than expected for her A1c  She thinks that because of depression she has gone off her diet and is not doing well at all  Also not doing much walking even though her knee pain is better  Her weight however is somewhat less and likely  benefiting from 1 mg of Ozempic along with the [place]  Blood sugars are mostly high in the evenings after dinner based on her diet   Dinner usually at 4-5 pm  Side effects from medications have been: Diarrhea, abdominal pain with Trulicity Compliance with the medical regimen: Good  Glucose monitoring:  done less than once  a day         Glucometer: Contour        Blood Glucose readings from download   PRE-MEAL Fasting Lunch Dinner Bedtime Overall  Glucose range: 132-178      Mean/median:  146     163   POST-MEAL PC Breakfast PC Lunch PC Dinner  Glucose range:   102-254  Mean/median:    186   Previous readings:  PRE-MEAL Fasting Lunch Dinner Bedtime Overall  Glucose range:  134, 158    134-257   Mean/median:      177      Self-care: The diet that the patient has been following is: tries to limit carbohydrates .     Meals: 3 meals per day. Breakfast is yogurt/ cereal/eggs with toast at 9-10 am; , lunch is a sandwich, and dinner at 5 pm will have baked chicken, starch and vegetables.  Has snacks with yogurt, cottage cheese, fruit, nuts and  rice cakes               Dietician visit, most recent: 9/15              Weight history:  Wt Readings from Last 3 Encounters:  10/09/19 201 lb 9.6 oz (91.4 kg)  06/02/19 207 lb 3.2 oz (94 kg)  03/02/19 216 lb 9.6 oz (98.2 kg)    Glycemic control:     Lab Results  Component Value Date   HGBA1C 6.5 (A) 10/09/2019   HGBA1C 6.3 (A) 06/02/2019   HGBA1C 6.9 (A) 03/02/2019   Lab Results  Component Value Date   MICROALBUR <0.7 10/09/2019   LDLCALC 66 10/09/2019   CREATININE 0.83 10/09/2019       Allergies as of 10/09/2019      Reactions   Bisoprolol-hydrochlorothiazide Swelling, Other (See Comments)   Face and throat became swollen; wheezing also   Relafen [nabumetone] Hives, Shortness Of Breath   Aspirin Other (See Comments)   Irritates patient's IBS and Diverticulitis   Dulaglutide Other (See Comments)   Trulicity:  Abdominal pain   Other Other (See Comments)   Seasonal allergies: Runny nose/eyes and congestion   Restasis [cyclosporine] Itching, Rash, Other (See Comments)   Eyes burn and rash develops around the eyes      Medication List       Accurate as of October 09, 2019 11:59 PM. If you have any questions, ask your nurse or doctor.        STOP taking these medications   Venlafaxine HCl 225 MG Tb24 Stopped by: Elayne Snare, MD     TAKE these medications   acetaminophen 650 MG CR tablet Commonly known as: TYLENOL Take 650-1,300 mg by mouth every 8 (eight) hours as needed (for pain or headaches).   albuterol 108 (90 Base) MCG/ACT inhaler Commonly known as: VENTOLIN HFA Inhale into the lungs.   ProAir HFA 108 (90 Base) MCG/ACT inhaler Generic drug: albuterol Inhale 2 puffs into the lungs every 6 (six) hours as needed.   ASPIRIN 81 PO Take 1 tablet by mouth daily.   atorvastatin 20 MG tablet Commonly known as: LIPITOR Take 1 tablet (20 mg total) by mouth daily.   Bayer Contour Next Monitor w/Device Kit Use to check blood sugar 2 times per day dx code E11.49   Bayer Microlet Lancets lancets Use as instructed to check blood sugar 2 times per day dx code E11.49   butalbital-acetaminophen-caffeine 50-325-40 MG tablet Commonly known as: FIORICET Take 1 tablet by mouth every 6 (six) hours as needed for headache. Do not refill in less than 30 days.   Contour Next Test test strip Generic drug: glucose blood TEST 2 TIMES A DAY E11.49   Farxiga 10 MG Tabs tablet Generic drug: dapagliflozin propanediol TAKE 1 TABLET BY MOUTH EVERY DAY   gabapentin 600 MG tablet Commonly known as: NEURONTIN TAKE 1 TABLET 3 TIMES PER DAY AS NEEDED What changed: See the new instructions.   insulin degludec 100 UNIT/ML FlexTouch Pen Commonly known as: Tresiba FlexTouch Inject 28 units under the skin once daily.   insulin lispro 100 UNIT/ML KwikPen Commonly known as: HumaLOG KwikPen INJECT 8 UNITS  SUBCUTANEOUSLY BEFORE MEALS   Insulin Pen Needle 32G X 4 MM Misc Use one per day to inject victoza   lisinopril 10 MG tablet Commonly known as: ZESTRIL TAKE 1 TABLET BY MOUTH EVERY DAY   metFORMIN 500 MG 24  hr tablet Commonly known as: GLUCOPHAGE-XR TAKE 2 TABLETS BY MOUTH TWO TIMES A DAY   methotrexate 2.5 MG tablet Commonly known as: RHEUMATREX Take 2.5 mg by mouth once a week. TAKE 10 TABLETS BY MOUTH AT ONE TIME EVERY Monday.   nitroGLYCERIN 0.4 MG SL tablet Commonly known as: NITROSTAT Place 1 tablet (0.4 mg total) under the tongue every 5 (five) minutes as needed for chest pain.   Ozempic (1 MG/DOSE) 2 MG/1.5ML Sopn Generic drug: Semaglutide (1 MG/DOSE) INJECT 1 MG INTO THE SKIN ONCE A WEEK.   sertraline 100 MG tablet Commonly known as: ZOLOFT Take 200 mg by mouth daily. Take 2 tablets by mouth once daily.       Allergies:  Allergies  Allergen Reactions  . Bisoprolol-Hydrochlorothiazide Swelling and Other (See Comments)    Face and throat became swollen; wheezing also  . Relafen [Nabumetone] Hives and Shortness Of Breath  . Aspirin Other (See Comments)    Irritates patient's IBS and Diverticulitis  . Dulaglutide Other (See Comments)    Trulicity: Abdominal pain  . Other Other (See Comments)    Seasonal allergies: Runny nose/eyes and congestion  . Restasis [Cyclosporine] Itching, Rash and Other (See Comments)    Eyes burn and rash develops around the eyes    Past Medical History:  Diagnosis Date  . Colon polyps   . Depression 05/10/2013  . Diabetes mellitus without complication ([place])   . 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 Diverticulosis   . Headache   . Hypertension   . Irritable bowel   . 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 Neuropathy   . Neuropathy associated with endocrine disorder ([place])   . Obesity (BMI 30-39.9)   . Osteopenia   . Visual disturbance     Past Surgical History:  Procedure Laterality Date  . arm surgery  Right   . plantar faci      Family History  Problem Relation Age of Onset  . Diabetes type II Mother   . Stroke Mother   . Hypertension Mother   . Heart disease Father   . Hypertension Father   . Diabetes Father   . Heart attack Father   . Heart disease Brother   . Hypertension Brother   . Diabetes Brother   . Kidney disease Brother   . Diabetes Sister   . Asthma Maternal Grandmother   . Hypertension Maternal Grandmother   . Heart attack Maternal Grandmother   . Heart attack Maternal Grandfather   . Heart attack Paternal Grandmother   . Diabetes Paternal Grandfather   . Heart attack Paternal Grandfather   . Diabetes Brother   . Heart attack Brother   . Diabetes Brother   . Diabetes Brother   . Other Brother        Bright's Disease  . Leukemia Other        brother's grandson  . Breast cancer Neg Hx     Social History:  reports that she has never smoked. She has never used smokeless tobacco. She reports that she does not drink alcohol or use drugs.    Review of Systems    She is on methotrexate tablets from her ophthalmologist for inflammatory disease Her vision is better She has been monitored with chemistry panel and CBC every 3 months       Lipids: These have been controlled with Lipitor 20 mg daily as of 11/2018 and no recent labs available  Lab Results  Component Value Date   CHOL 144 10/09/2019   HDL 58.50 10/09/2019   LDLCALC 66 10/09/2019   TRIG 96.0 10/09/2019   CHOLHDL 2 10/09/2019                  The blood pressure has been high for several years treated by PCP  Blood pressure is controlled with taking 10 mg lisinopril    BP Readings from Last 3 Encounters:  10/09/19 110/64  06/02/19 128/78  03/02/19 112/70        She has a long history of Numbness, pain, tingling and burning in feet    Symptoms being treated with  gabapentin to 600 mg 3 times a day and she has better control of symptoms as long as she does not forget her  medication   Foot exam in 5/20 showed decreased monofilament sensation in the toes  Physical Examination:  BP 110/64 (BP Location: Left Arm, Patient Position: Sitting, Cuff Size: Large)   Pulse 90   Ht 5' 6"  (1.676 m)   Wt 201 lb 9.6 oz (91.4 kg)   SpO2 97%   BMI 32.54 kg/m   No ankle edema  ASSESSMENT:  Diabetes type 2, insulin requiring with obesity  She is on a regimen of TRESIBA 28 units daily, Humalog twice daily, Ozempic 0.5 weekly,   metformin 2 g and Farxiga 10 mg  See history of present illness for detailed discussion of current diabetes management, blood sugar patterns and problems identified  Her A1c is 6.5%  However recently because of poor diet her blood sugars have been as high as 254 after meals Fasting readings are mildly increased overall also She is however losing weight either from stress or continued use of Ozempic and [place]  She has not taken as much Humalog as recommended at suppertime and has only 1 or 2 good readings postprandially  Hypertension: Is well controlled with only low-dose lisinopril  LIPIDS: Needs follow-up labs which are overdue  PLAN:   Increase Tresiba to 30 units at least and may need to go up further if morning sugars are not under 130 She needs to take at least 6 units and preferably 10 units based on her meal size at dinnertime to keep her postprandial readings under 160 She will try to cut back on portions, carbohydrates and high-fat meals Start walking more regularly Check blood sugars after breakfast to help adjust the mealtime dose in the morning . She will continue to work with her PCP regarding management of her depression which will in turn help her diabetes management  LIPIDS: Needs follow-up labs to help monitor her treatment   Patient Instructions  30 Tresiba and keep am sugar <130   Check blood sugars on waking up days a week  Also check blood sugars about 2 hours after meals and do this after different  meals by rotation  Recommended blood sugar levels on waking up are 90-130 and about 2 hours after meal is 130-180  Please bring your blood sugar monitor to each visit, thank you  Take 6-10 Humalog at supper         Elayne Snare 10/11/2019, 9:04 PM     Note: This office note was prepared with Dragon voice recognition system technology. Any transcriptional errors that result from this process are unintentional.

## 2019-10-09 ENCOUNTER — Ambulatory Visit (INDEPENDENT_AMBULATORY_CARE_PROVIDER_SITE_OTHER): Payer: 59 | Admitting: Endocrinology

## 2019-10-09 ENCOUNTER — Other Ambulatory Visit: Payer: Self-pay

## 2019-10-09 ENCOUNTER — Encounter: Payer: Self-pay | Admitting: Endocrinology

## 2019-10-09 VITALS — BP 110/64 | HR 90 | Ht 66.0 in | Wt 201.6 lb

## 2019-10-09 DIAGNOSIS — Z794 Long term (current) use of insulin: Secondary | ICD-10-CM

## 2019-10-09 DIAGNOSIS — I10 Essential (primary) hypertension: Secondary | ICD-10-CM

## 2019-10-09 DIAGNOSIS — E7800 Pure hypercholesterolemia, unspecified: Secondary | ICD-10-CM

## 2019-10-09 DIAGNOSIS — E1165 Type 2 diabetes mellitus with hyperglycemia: Secondary | ICD-10-CM

## 2019-10-09 LAB — LIPID PANEL
Cholesterol: 144 mg/dL (ref 0–200)
HDL: 58.5 mg/dL (ref >39.00)
LDL Cholesterol: 66 mg/dL (ref 0–99)
NonHDL: 85.55
Total CHOL/HDL Ratio: 2
Triglycerides: 96 mg/dL (ref 0.0–149.0)
VLDL: 19.2 mg/dL (ref 0.0–40.0)

## 2019-10-09 LAB — COMPREHENSIVE METABOLIC PANEL
ALT: 35 U/L (ref 0–35)
AST: 21 U/L (ref 0–37)
Albumin: 4.2 g/dL (ref 3.5–5.2)
Alkaline Phosphatase: 95 U/L (ref 39–117)
BUN: 14 mg/dL (ref 6–23)
CO2: 26 mEq/L (ref 19–32)
Calcium: 9.2 mg/dL (ref 8.4–10.5)
Chloride: 104 mEq/L (ref 96–112)
Creatinine, Ser: 0.83 mg/dL (ref 0.40–1.20)
GFR: 69.49 mL/min (ref >60.00)
Glucose, Bld: 131 mg/dL — ABNORMAL HIGH (ref 70–99)
Potassium: 3.9 mEq/L (ref 3.5–5.1)
Sodium: 138 mEq/L (ref 135–145)
Total Bilirubin: 0.4 mg/dL (ref 0.2–1.2)
Total Protein: 6.7 g/dL (ref 6.0–8.3)

## 2019-10-09 LAB — POCT GLYCOSYLATED HEMOGLOBIN (HGB A1C): Hemoglobin A1C: 6.5 % — AB (ref 4.0–5.6)

## 2019-10-09 LAB — MICROALBUMIN / CREATININE URINE RATIO
Creatinine,U: 62.1 mg/dL
Microalb Creat Ratio: 1.1 mg/g (ref 0.0–30.0)
Microalb, Ur: <0.7 mg/dL (ref 0.0–1.9)

## 2019-10-09 NOTE — Patient Instructions (Signed)
30 Tresiba and keep am sugar <130   Check blood sugars on waking up days a week  Also check blood sugars about 2 hours after meals and do this after different meals by rotation  Recommended blood sugar levels on waking up are 90-130 and about 2 hours after meal is 130-180  Please bring your blood sugar monitor to each visit, thank you  Take 6-10 Humalog at supper

## 2019-11-27 ENCOUNTER — Other Ambulatory Visit: Payer: Self-pay

## 2019-11-27 ENCOUNTER — Other Ambulatory Visit: Payer: Self-pay | Admitting: Endocrinology

## 2020-01-13 ENCOUNTER — Ambulatory Visit: Payer: 59 | Admitting: Endocrinology

## 2020-01-26 DIAGNOSIS — R7989 Other specified abnormal findings of blood chemistry: Secondary | ICD-10-CM | POA: Insufficient documentation

## 2020-02-01 ENCOUNTER — Ambulatory Visit (INDEPENDENT_AMBULATORY_CARE_PROVIDER_SITE_OTHER): Payer: 59 | Admitting: Endocrinology

## 2020-02-01 ENCOUNTER — Other Ambulatory Visit: Payer: Self-pay

## 2020-02-01 ENCOUNTER — Other Ambulatory Visit: Payer: Self-pay | Admitting: Endocrinology

## 2020-02-01 ENCOUNTER — Encounter: Payer: Self-pay | Admitting: Endocrinology

## 2020-02-01 VITALS — BP 118/70 | HR 75 | Ht 66.0 in | Wt 205.6 lb

## 2020-02-01 DIAGNOSIS — E1165 Type 2 diabetes mellitus with hyperglycemia: Secondary | ICD-10-CM | POA: Diagnosis not present

## 2020-02-01 DIAGNOSIS — E063 Autoimmune thyroiditis: Secondary | ICD-10-CM | POA: Diagnosis not present

## 2020-02-01 DIAGNOSIS — Z794 Long term (current) use of insulin: Secondary | ICD-10-CM | POA: Diagnosis not present

## 2020-02-01 DIAGNOSIS — R42 Dizziness and giddiness: Secondary | ICD-10-CM

## 2020-02-01 LAB — POCT GLYCOSYLATED HEMOGLOBIN (HGB A1C): Hemoglobin A1C: 7 % — AB (ref 4.0–5.6)

## 2020-02-01 NOTE — Patient Instructions (Addendum)
Take 32 Tresiba  Upto 14 Novolog at dinner  Check coverage for Accucheck meter  Walk daily  Lisinopril: stop

## 2020-02-01 NOTE — Progress Notes (Signed)
Patient ID: [name], female   DOB: 1956-02-10, 64 y.o.   MRN: [id number]           Reason for Appointment: Follow-up    Referring physician: Kathryne Eriksson  History of Present Illness:          Diagnosis: Type 2 diabetes mellitus, date of diagnosis:  2010       Past history:  She was having symptoms of neuropathy at onset At that time she was started on metformin alone, 500 mg twice a day and this was continued unchanged until recently Apparently she was also given Onglyza about 4 years ago but details of her control at that time is not available Her A1c levels have been mild increase over the last year but she thinks her blood sugars had been generally good with only some readings up to 200. A1c was 7.1 in 1/15 and she was continued on metformin and Onglyza When her A1c was 7.9 in August she was told to double her metformin to 2 g daily She  had blood sugars of 200 or more prior to her initial consultation  Her Trulicity was changed to Victoza because of diarrhea and abdominal pain and has been able to tolerate Victoza  1.8 hs  When A1c was 7.6 she was started on Invokana 100 mg in 11/16, this was subsequently increased when her A1c was 7.5 in 10/17 She has been on insulin since 12/2016  Recent history:   INSULIN regimen: Tresiba  30 units daily in am, Humalog usually 10 units at suppertime  Non-insulin hypoglycemic drugs the patient is taking are:  metformin ER 2 g daily, Farxiga 10 mg daily, Ozempic 1 mg weekly  Her A1c is lower than expected at 7.1  Current blood sugar patterns and problems identified:  She has checked her blood sugars mostly with her new meter which does not give a proper analysis or format for blood sugars at different times of the day  She says she has had a lot of stress with family illness  Likely she is not planning her meals well, sometimes stress eating  With this her blood sugars are quite variable at all times although highest after  dinner  She is mostly eating 1 meal a day  Her weight is up 4 pounds  She thinks that she is consistent with her Ozempic and [place]  No hypoglycemia even though she has gone up to 10 units on her Humalog with her evening meal  However did not increase her Tyler Aas as directed on her last visit   Dinner usually at 4-5 pm Bf 10 am  Side effects from medications have been: Diarrhea, abdominal pain with Trulicity   Glucose monitoring:  done less than once  a day         Glucometer:   [place]     Blood Glucose readings from her meter   PRE-MEAL Fasting Lunch Dinner Bedtime Overall  Glucose range:  144-252      Mean/median:  190?    198   POST-MEAL PC Breakfast PC Lunch PC Dinner  Glucose range:   123-305  Mean/median:    207?   Previous readings:  PRE-MEAL Fasting Lunch Dinner Bedtime Overall  Glucose range: 132-178      Mean/median:  146     163   POST-MEAL PC Breakfast PC Lunch PC Dinner  Glucose range:   102-254  Mean/median:    186      Self-care: The diet that  the patient has been following is: tries to limit carbohydrates .     Meals: 3 meals per day. Breakfast is yogurt/ cereal/eggs with toast at 9-10 am; , lunch is a sandwich, and dinner at 5 pm will have baked chicken, starch and vegetables.  Has snacks with yogurt, cottage cheese, fruit, nuts and  rice cakes               Dietician visit, most recent: 9/15              Weight history:  Wt Readings from Last 3 Encounters:  02/01/20 205 lb 9.6 oz (93.3 kg)  10/09/19 201 lb 9.6 oz (91.4 kg)  06/02/19 207 lb 3.2 oz (94 kg)    Glycemic control:     Lab Results  Component Value Date   HGBA1C 7.0 (A) 02/01/2020   HGBA1C 6.5 (A) 10/09/2019   HGBA1C 6.3 (A) 06/02/2019   Lab Results  Component Value Date   MICROALBUR <0.7 10/09/2019   LDLCALC 66 10/09/2019   CREATININE 0.83 10/09/2019       Allergies as of 02/01/2020      Reactions   Bisoprolol-hydrochlorothiazide Swelling, Other (See Comments)    Face and throat became swollen; wheezing also   Relafen [nabumetone] Hives, Shortness Of Breath   Aspirin Other (See Comments)   Irritates patient's IBS and Diverticulitis   Dulaglutide Other (See Comments)   Trulicity: Abdominal pain   Other Other (See Comments)   Seasonal allergies: Runny nose/eyes and congestion   Restasis [cyclosporine] Itching, Rash, Other (See Comments)   Eyes burn and rash develops around the eyes      Medication List       Accurate as of February 01, 2020 11:30 AM. If you have any questions, ask your nurse or doctor.        acetaminophen 650 MG CR tablet Commonly known as: TYLENOL Take 650-1,300 mg by mouth every 8 (eight) hours as needed (for pain or headaches).   albuterol 108 (90 Base) MCG/ACT inhaler Commonly known as: VENTOLIN HFA Inhale into the lungs.   ProAir HFA 108 (90 Base) MCG/ACT inhaler Generic drug: albuterol Inhale 2 puffs into the lungs every 6 (six) hours as needed.   ASPIRIN 81 PO Take 1 tablet by mouth daily.   atorvastatin 20 MG tablet Commonly known as: LIPITOR Take 1 tablet (20 mg total) by mouth daily.   Bayer Contour Next Monitor w/Device Kit Use to check blood sugar 2 times per day dx code E11.49   Bayer Microlet Lancets lancets Use as instructed to check blood sugar 2 times per day dx code E11.49   butalbital-acetaminophen-caffeine 50-325-40 MG tablet Commonly known as: FIORICET Take 1 tablet by mouth every 6 (six) hours as needed for headache. Do not refill in less than 30 days.   Contour Next Test test strip Generic drug: glucose blood TEST 2 TIMES A DAY E11.49   Farxiga 10 MG Tabs tablet Generic drug: dapagliflozin propanediol TAKE 1 TABLET BY MOUTH EVERY DAY   gabapentin 600 MG tablet Commonly known as: NEURONTIN TAKE 1 TABLET 3 TIMES PER DAY AS NEEDED What changed: See the new instructions.   insulin degludec 100 UNIT/ML FlexTouch Pen Commonly known as: Tresiba FlexTouch Inject 28 units under the skin  once daily. What changed:   how much to take  additional instructions   insulin lispro 100 UNIT/ML KwikPen Commonly known as: HumaLOG KwikPen INJECT 8 UNITS SUBCUTANEOUSLY BEFORE MEALS   Insulin Pen Needle 32G X  4 MM Misc Use one per day to inject victoza   levothyroxine 25 MCG tablet Commonly known as: SYNTHROID Take 25 mcg by mouth daily before breakfast.   lisinopril 10 MG tablet Commonly known as: ZESTRIL TAKE 1 TABLET BY MOUTH EVERY DAY   metFORMIN 500 MG 24 hr tablet Commonly known as: GLUCOPHAGE-XR TAKE 2 TABLETS BY MOUTH TWO TIMES A DAY   methotrexate 2.5 MG tablet Commonly known as: RHEUMATREX Take 2.5 mg by mouth once a week. TAKE 10 TABLETS BY MOUTH AT ONE TIME EVERY Monday.   nitroGLYCERIN 0.4 MG SL tablet Commonly known as: NITROSTAT Place 1 tablet (0.4 mg total) under the tongue every 5 (five) minutes as needed for chest pain.   Ozempic (1 MG/DOSE) 2 MG/1.5ML Sopn Generic drug: Semaglutide (1 MG/DOSE) INJECT 1 MG INTO THE SKIN ONCE A WEEK.   sertraline 100 MG tablet Commonly known as: ZOLOFT Take 200 mg by mouth daily. Take 2 tablets by mouth once daily.       Allergies:  Allergies  Allergen Reactions  . Bisoprolol-Hydrochlorothiazide Swelling and Other (See Comments)    Face and throat became swollen; wheezing also  . Relafen [Nabumetone] Hives and Shortness Of Breath  . Aspirin Other (See Comments)    Irritates patient's IBS and Diverticulitis  . Dulaglutide Other (See Comments)    Trulicity: Abdominal pain  . Other Other (See Comments)    Seasonal allergies: Runny nose/eyes and congestion  . Restasis [Cyclosporine] Itching, Rash and Other (See Comments)    Eyes burn and rash develops around the eyes    Past Medical History:  Diagnosis Date  . Colon polyps   . Depression 05/10/2013  . Diabetes mellitus without complication ([place])   . 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 Diverticulosis   . Headache   . Hypertension   . Irritable  bowel   . 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 Neuropathy   . Neuropathy associated with endocrine disorder ([place])   . Obesity (BMI 30-39.9)   . Osteopenia   . Visual disturbance     Past Surgical History:  Procedure Laterality Date  . arm surgery Right   . plantar faci      Family History  Problem Relation Age of Onset  . Diabetes type II Mother   . Stroke Mother   . Hypertension Mother   . Heart disease Father   . Hypertension Father   . Diabetes Father   . Heart attack Father   . Heart disease Brother   . Hypertension Brother   . Diabetes Brother   . Kidney disease Brother   . Diabetes Sister   . Asthma Maternal Grandmother   . Hypertension Maternal Grandmother   . Heart attack Maternal Grandmother   . Heart attack Maternal Grandfather   . Heart attack Paternal Grandmother   . Diabetes Paternal Grandfather   . Heart attack Paternal Grandfather   . Diabetes Brother   . Heart attack Brother   . Diabetes Brother   . Diabetes Brother   . Other Brother        Bright's Disease  . Leukemia Other        brother's grandson  . Breast cancer Neg Hx     Social History:  reports that she has never smoked. She has never used smokeless tobacco. She reports that she does not drink alcohol and does not use drugs.    Review of Systems    She is  on methotrexate tablets from her ophthalmologist for inflammatory disease Her vision is better She has been monitored with chemistry panel and CBC every 3 months       Lipids: These have been controlled with Lipitor 20 mg daily as of 11/2018 and recent LDL 73 done in 7/21 by PCP       Lab Results  Component Value Date   CHOL 144 10/09/2019   HDL 58.50 10/09/2019   LDLCALC 66 10/09/2019   TRIG 96.0 10/09/2019   CHOLHDL 2 10/09/2019                  The blood pressure has been high for several years treated by PCP  Blood pressure is controlled with taking 10 mg lisinopril Home BP 100/58 recently She  said that sometimes she will feel like she is going to pass out Blood pressure also was normal with her PCP recently but lisinopril has been continued  BP Readings from Last 3 Encounters:  02/01/20 118/70  10/09/19 110/64  06/02/19 128/78        She has a long history of Numbness, pain, tingling and burning in feet    Symptoms being treated with  gabapentin to 600 mg 3 times a day and she has usually good control of symptoms  Foot exam in 5/20 showed decreased monofilament sensation in the toes  She has had fatigue for some time and PCP on her TSH to be 6.2, she has just started taking 25 mcg levothyroxine (   Lab Results  Component Value Date   TSH 2.51 12/20/2016   TSH 3.156 05/11/2013     Physical Examination:  BP 118/70 (BP Location: Left Arm, Patient Position: Sitting, Cuff Size: Large)   Pulse 75   Ht _0  (1.676 m)   Wt 205 lb 9.6 oz (93.3 kg)   SpO2 97%   BMI 33.18 kg/m   No pedal edema    ASSESSMENT:  Diabetes type 2, insulin requiring with obesity  She is on a regimen of TRESIBA 28 units daily, Humalog twice daily, Ozempic 0.5 weekly,   metformin 2 g and Farxiga 10 mg  See history of present illness for detailed discussion of current diabetes management, blood sugar patterns and problems identified  Her A1c is 7%  However again her blood sugars are difficult to control and likely worse recently She appears to be more insulin deficient also since she is having postprandial readings over 300 occasionally As before her diet has been inconsistent and sometimes not controlled well She is not exercising Difficult to adjust her doses of insulin since her blood sugars are variable based on her stress level and diet  Hypertension: Blood pressure is low normal and occasionally she is having presyncope-like symptoms May be related to low blood pressure   LIPIDS: Well-controlled  Hypothyroidism: Although this is likely related to autoimmune disease not clear  if she needs long-term supplementation, fatigue may be nonspecific  PLAN:   Increase Tresiba to 32 for now May take up to 14 units at dinnertime for mealtime coverage Make sure she takes her Humalog before starting to eat Start back on walking regularly She will try to see if Accu-Chek is covered by her insurance Otherwise she will need to keep a log of her blood sugars for better interpretation, log book given  Check thyroid levels on the next visit  Stop lisinopril .    Patient Instructions  Take 32 Tresiba  Upto 14 Novolog at dinner  Check coverage for Accucheck meter  Walk daily  Lisinopril: stop       Elayne Snare 02/01/2020, 11:30 AM     Note: This office note was prepared with Dragon voice recognition system technology. Any transcriptional errors that result from this process are unintentional.

## 2020-02-02 ENCOUNTER — Other Ambulatory Visit: Payer: Self-pay | Admitting: Endocrinology

## 2020-02-02 ENCOUNTER — Telehealth: Payer: Self-pay | Admitting: *Deleted

## 2020-02-02 ENCOUNTER — Other Ambulatory Visit: Payer: Self-pay | Admitting: *Deleted

## 2020-02-02 MED ORDER — ONETOUCH VERIO FLEX SYSTEM W/DEVICE KIT: PACK | 0 refills | Status: AC

## 2020-02-02 MED ORDER — CONTOUR NEXT TEST VI STRP: ORAL_STRIP | 3 refills | Status: DC

## 2020-02-02 MED ORDER — ONETOUCH DELICA LANCETS 30G MISC: 1 refills | Status: AC

## 2020-02-02 MED ORDER — ONETOUCH VERIO VI STRP: ORAL_STRIP | 1 refills | Status: AC

## 2020-02-02 NOTE — Telephone Encounter (Signed)
Advised patient RX sent to pharmacy for Use OneTouch Verio Flex, test strips, and lancets. Patient verbalized understanding and did not have any questions.

## 2020-03-28 ENCOUNTER — Other Ambulatory Visit: Payer: Self-pay

## 2020-03-28 ENCOUNTER — Ambulatory Visit (INDEPENDENT_AMBULATORY_CARE_PROVIDER_SITE_OTHER): Payer: 59 | Admitting: Endocrinology

## 2020-03-28 ENCOUNTER — Other Ambulatory Visit (INDEPENDENT_AMBULATORY_CARE_PROVIDER_SITE_OTHER): Payer: 59

## 2020-03-28 ENCOUNTER — Encounter: Payer: Self-pay | Admitting: Endocrinology

## 2020-03-28 VITALS — BP 118/80 | HR 73 | Ht 68.0 in | Wt 206.0 lb

## 2020-03-28 DIAGNOSIS — E063 Autoimmune thyroiditis: Secondary | ICD-10-CM

## 2020-03-28 DIAGNOSIS — Z794 Long term (current) use of insulin: Secondary | ICD-10-CM | POA: Diagnosis not present

## 2020-03-28 DIAGNOSIS — E1165 Type 2 diabetes mellitus with hyperglycemia: Secondary | ICD-10-CM | POA: Diagnosis not present

## 2020-03-28 DIAGNOSIS — R5383 Other fatigue: Secondary | ICD-10-CM | POA: Diagnosis not present

## 2020-03-28 LAB — BASIC METABOLIC PANEL
BUN: 11 mg/dL (ref 6–23)
CO2: 26 mEq/L (ref 19–32)
Calcium: 9.4 mg/dL (ref 8.4–10.5)
Chloride: 103 mEq/L (ref 96–112)
Creatinine, Ser: 0.95 mg/dL (ref 0.40–1.20)
GFR: 59.38 mL/min — ABNORMAL LOW (ref >60.00)
Glucose, Bld: 106 mg/dL — ABNORMAL HIGH (ref 70–99)
Potassium: 3.6 mEq/L (ref 3.5–5.1)
Sodium: 139 mEq/L (ref 135–145)

## 2020-03-28 LAB — T4, FREE: Free T4: 0.79 ng/dL (ref 0.60–1.60)

## 2020-03-28 LAB — TSH: TSH: 2.32 u[IU]/mL (ref 0.35–4.50)

## 2020-03-28 NOTE — Patient Instructions (Addendum)
Take 16 Tresiba and adjust based on am sugars   Adjust Humalog based on Carbs/ calories  Check blood sugars on waking up 3 days a week  Also check blood sugars about 2 hours after meals and do this after different meals by rotation  Recommended blood sugar levels on waking up are 90-130 and about 2 hours after meal is 130-160  Please bring your blood sugar monitor to each visit, thank you

## 2020-03-28 NOTE — Progress Notes (Signed)
Patient ID: [name], female   DOB: 11/07/1955, 64 y.o.   MRN: [id number]           Reason for Appointment: Follow-up    Referring physician: Kathryne Eriksson  History of Present Illness:          Diagnosis: Type 2 diabetes mellitus, date of diagnosis:  2010       Past history:  She was having symptoms of neuropathy at onset At that time she was started on metformin alone, 500 mg twice a day and this was continued unchanged until recently Apparently she was also given Onglyza about 4 years ago but details of her control at that time is not available Her A1c levels have been mild increase over the last year but she thinks her blood sugars had been generally good with only some readings up to 200. A1c was 7.1 in 1/15 and she was continued on metformin and Onglyza When her A1c was 7.9 in August she was told to double her metformin to 2 g daily She  had blood sugars of 200 or more prior to her initial consultation  Her Trulicity was changed to Victoza because of diarrhea and abdominal pain and has been able to tolerate Victoza  1.8 hs  When A1c was 7.6 she was started on Invokana 100 mg in 11/16, this was subsequently increased when her A1c was 7.5 in 10/17 She has been on insulin since 12/2016  Recent history:   INSULIN regimen: Tresiba  28 units daily in am, Humalog usually 0-12 units at suppertime  Non-insulin hypoglycemic drugs the patient is taking are:  metformin ER 2 g daily, Farxiga 10 mg daily, Ozempic 1 mg weekly  Her A1c in 7/21 was lower than expected at 7.0  Current blood sugar patterns and problems identified:  She has still not checked readings before breakfast as directed  Currently using her Contour meter which was downloaded today  Most of her blood sugars are at variable times in the evenings  Blood sugars are variably high in the evening but on an average still over 200  She has gone up 2 units on her Humalog at dinnertime but has not adjusted based on what  she is eating  Her highest blood sugars are usually when she is not eating at home in the evening and periodically will forget to take her Humalog at that time  Also when eating out she will also not watch her carbohydrates or fat intake  Has only 1 reading after breakfast above 180  However not clear why she does not take the prescribed dose of Tresiba and has cut it down to 28 units.  She says she feels more tired with the higher dose  Only recently has started walking, yesterday walked 2 miles    Mealtimes: Breakfast 10 AM, dinner usually at 5 pm  Side effects from medications have been: Diarrhea, abdominal pain with Trulicity   Glucose monitoring:  done less than once  a day         Glucometer:   [place]     Blood Glucose readings from her meter   PRE-MEAL Fasting Lunch Dinner Bedtime Overall  Glucose range:    107-209    Mean/median:      191   POST-MEAL PC Breakfast PC Lunch PC Dinner  Glucose range:  155-225   121-369  Mean/median:    210  Previous readings:  PRE-MEAL Fasting Lunch Dinner Bedtime Overall  Glucose range:  144-252  Mean/median:  190?    198   POST-MEAL PC Breakfast PC Lunch PC Dinner  Glucose range:   123-305  Mean/median:    207?      Self-care: The diet that the patient has been following is: tries to limit carbohydrates .     Meals: 3 meals per day. Breakfast is yogurt/ cereal/eggs with toast at 9-10 am; , lunch is a sandwich, and dinner at 5 pm will have baked chicken, starch and vegetables.  Has snacks with yogurt, cottage cheese, fruit, nuts and  rice cakes               Dietician visit, most recent: 9/15              Weight history:  Wt Readings from Last 3 Encounters:  03/28/20 206 lb (93.4 kg)  02/01/20 205 lb 9.6 oz (93.3 kg)  10/09/19 201 lb 9.6 oz (91.4 kg)    Glycemic control:     Lab Results  Component Value Date   HGBA1C 7.0 (A) 02/01/2020   HGBA1C 6.5 (A) 10/09/2019   HGBA1C 6.3 (A) 06/02/2019   Lab Results   Component Value Date   MICROALBUR <0.7 10/09/2019   LDLCALC 66 10/09/2019   CREATININE 0.95 03/28/2020       Allergies as of 03/28/2020      Reactions   Bisoprolol-hydrochlorothiazide Swelling, Other (See Comments)   Face and throat became swollen; wheezing also   Relafen [nabumetone] Hives, Shortness Of Breath   Aspirin Other (See Comments)   Irritates patient's IBS and Diverticulitis   Dulaglutide Other (See Comments)   Trulicity: Abdominal pain   Other Other (See Comments)   Seasonal allergies: Runny nose/eyes and congestion   Restasis [cyclosporine] Itching, Rash, Other (See Comments)   Eyes burn and rash develops around the eyes      Medication List       Accurate as of March 28, 2020  4:50 PM. If you have any questions, ask your nurse or doctor.        STOP taking these medications   lisinopril 10 MG tablet Commonly known as: ZESTRIL Stopped by: Elayne Snare, MD     TAKE these medications   acetaminophen 650 MG CR tablet Commonly known as: TYLENOL Take 650-1,300 mg by mouth every 8 (eight) hours as needed (for pain or headaches).   albuterol 108 (90 Base) MCG/ACT inhaler Commonly known as: VENTOLIN HFA Inhale into the lungs.   ProAir HFA 108 (90 Base) MCG/ACT inhaler Generic drug: albuterol Inhale 2 puffs into the lungs every 6 (six) hours as needed.   ASPIRIN 81 PO Take 1 tablet by mouth daily.   atorvastatin 20 MG tablet Commonly known as: LIPITOR Take 1 tablet (20 mg total) by mouth daily.   butalbital-acetaminophen-caffeine 50-325-40 MG tablet Commonly known as: FIORICET Take 1 tablet by mouth every 6 (six) hours as needed for headache. Do not refill in less than 30 days.   Farxiga 10 MG Tabs tablet Generic drug: dapagliflozin propanediol TAKE 1 TABLET BY MOUTH EVERY DAY   gabapentin 600 MG tablet Commonly known as: NEURONTIN TAKE 1 TABLET 3 TIMES PER DAY AS NEEDED What changed: See the new instructions.   insulin degludec 100 UNIT/ML  FlexTouch Pen Commonly known as: Tresiba FlexTouch Inject 28 units under the skin once daily. What changed:   how much to take  additional instructions   insulin lispro 100 UNIT/ML KwikPen Commonly known as: HumaLOG KwikPen INJECT 8 UNITS SUBCUTANEOUSLY BEFORE MEALS  Insulin Pen Needle 32G X 4 MM Misc Use one per day to inject victoza   levothyroxine 25 MCG tablet Commonly known as: SYNTHROID Take 25 mcg by mouth daily before breakfast.   metFORMIN 500 MG 24 hr tablet Commonly known as: GLUCOPHAGE-XR TAKE 2 TABLETS BY MOUTH TWO TIMES A DAY   methotrexate 2.5 MG tablet Commonly known as: RHEUMATREX Take 2.5 mg by mouth once a week. TAKE 10 TABLETS BY MOUTH AT ONE TIME EVERY Monday.   nitroGLYCERIN 0.4 MG SL tablet Commonly known as: NITROSTAT Place 1 tablet (0.4 mg total) under the tongue every 5 (five) minutes as needed for chest pain.   OneTouch Delica Lancets 70V Misc Use OneTouch Delica Lancets to test blood sugars twice daily. DX: E11.49   OneTouch Verio Flex System w/Device Kit Use OneTouch Verio Flex to check blood sugar twice daily. DX: E11.49   OneTouch Verio test strip Generic drug: glucose blood Use OneTouch Verio test strips to test blood sugars twice daily. DX: E11.49   Ozempic (1 MG/DOSE) 2 MG/1.5ML Sopn Generic drug: Semaglutide (1 MG/DOSE) INJECT 1 MG INTO THE SKIN ONCE A WEEK.   sertraline 100 MG tablet Commonly known as: ZOLOFT Take 200 mg by mouth daily. Take 2 tablets by mouth once daily.       Allergies:  Allergies  Allergen Reactions  . Bisoprolol-Hydrochlorothiazide Swelling and Other (See Comments)    Face and throat became swollen; wheezing also  . Relafen [Nabumetone] Hives and Shortness Of Breath  . Aspirin Other (See Comments)    Irritates patient's IBS and Diverticulitis  . Dulaglutide Other (See Comments)    Trulicity: Abdominal pain  . Other Other (See Comments)    Seasonal allergies: Runny nose/eyes and congestion  .  Restasis [Cyclosporine] Itching, Rash and Other (See Comments)    Eyes burn and rash develops around the eyes    Past Medical History:  Diagnosis Date  . Colon polyps   . Depression 05/10/2013  . Diabetes mellitus without complication ([place])   . 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 Diverticulosis   . Headache   . Hypertension   . Irritable bowel   . 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 Neuropathy   . Neuropathy associated with endocrine disorder ([place])   . Obesity (BMI 30-39.9)   . Osteopenia   . Visual disturbance     Past Surgical History:  Procedure Laterality Date  . arm surgery Right   . plantar faci      Family History  Problem Relation Age of Onset  . Diabetes type II Mother   . Stroke Mother   . Hypertension Mother   . Heart disease Father   . Hypertension Father   . Diabetes Father   . Heart attack Father   . Heart disease Brother   . Hypertension Brother   . Diabetes Brother   . Kidney disease Brother   . Diabetes Sister   . Asthma Maternal Grandmother   . Hypertension Maternal Grandmother   . Heart attack Maternal Grandmother   . Heart attack Maternal Grandfather   . Heart attack Paternal Grandmother   . Diabetes Paternal Grandfather   . Heart attack Paternal Grandfather   . Diabetes Brother   . Heart attack Brother   . Diabetes Brother   . Diabetes Brother   . Other Brother        Bright's Disease  . Leukemia Other        brother's  grandson  . Breast cancer Neg Hx     Social History:  reports that she has never smoked. She has never used smokeless tobacco. She reports that she does not drink alcohol and does not use drugs.    Review of Systems    She is on methotrexate tablets from her ophthalmologist for inflammatory disease Her vision is better She has been monitored with chemistry panel and CBC every 3 months       Lipids: These have been controlled with Lipitor 20 mg daily as of 11/2018 and recent LDL 73 done in  7/21 by PCP       Lab Results  Component Value Date   CHOL 144 10/09/2019   HDL 58.50 10/09/2019   LDLCALC 66 10/09/2019   TRIG 96.0 10/09/2019   CHOLHDL 2 10/09/2019                  The blood pressure has been high for several years treated by PCP  Blood pressure is controlled   with taking 10 mg lisinopril Home BP upto 126 recently  Blood pressure also was normal with her PCP recently but lisinopril has been continued  BP Readings from Last 3 Encounters:  03/28/20 118/80  02/01/20 118/70  10/09/19 110/64        She has a long history of Numbness, pain, tingling and burning in feet    Symptoms being treated with  gabapentin to 600 mg 3 times a day and she has usually good control of symptoms  Foot exam in 5/20 showed decreased monofilament sensation in the toes  She has had fatigue for some time and PCP on her TSH to be 6.2, she has started taking 25 mcg levothyroxine hs She does not feel any better  Lab Results  Component Value Date   TSH 2.32 03/28/2020   TSH 2.51 12/20/2016   TSH 3.156 05/11/2013   FREET4 0.79 03/28/2020     Physical Examination:  BP 118/80   Pulse 73   Ht 5' 8"  (1.727 m)   Wt 206 lb (93.4 kg)   SpO2 96%   BMI 31.32 kg/m      ASSESSMENT:  Diabetes type 2, insulin requiring with obesity  She is on a regimen of TRESIBA 28 units daily, Humalog twice daily, Ozempic 0.5 weekly,   metformin 2 g and Farxiga 10 mg  See history of present illness for detailed discussion of current diabetes management, blood sugar patterns and problems identified  Her A1c is last 7%  She is insulin-dependent and appears to be needing generally more insulin both basal and bolus than what she is taking Although she thinks she gets fatigued with higher doses of Tresiba reassured her that this is not insulin related and she has not had any overnight hypoglycemia from this Recently has not been able to lose weight or exercise consistently also when eating  out she is not able to watch her diet consistently and also forgets her evening Humalog periodically  Hypertension: Blood pressure is controlled even without lisinopril which was causing hypotension    LIPIDS: Well-controlled on last measurement with PCP  Hypothyroidism: She has not subjectively felt any better with 25 mg levothyroxine started her PCP and still has fatigue We will recheck thyroid levels today She may well have subclinical hypothyroidism  PLAN:   Increase Tresiba to 32 for now Go up 2 to 4 units on the Humalog at dinnertime based on her meal size and carbohydrate content and not  the premeal blood sugar She needs to take her insulin with her when eating out Check fasting readings regularly and adjust her [place] Aas based on fasting blood sugars Blood sugar targets discussed More regular exercise Avoid high-fat and higher carbohydrate meals and snacks  Check thyroid levels for fatigue Also discuss fatigue with PCP    Patient Instructions  Take 72 Tresiba and adjust based on am sugars   Adjust Humalog based on Carbs/ calories  Check blood sugars on waking up 3 days a week  Also check blood sugars about 2 hours after meals and do this after different meals by rotation  Recommended blood sugar levels on waking up are 90-130 and about 2 hours after meal is 130-160  Please bring your blood sugar monitor to each visit, thank you         Elayne Snare 03/28/2020, 4:50 PM     Note: This office note was prepared with Dragon voice recognition system technology. Any transcriptional errors that result from this process are unintentional.

## 2020-03-30 NOTE — Progress Notes (Signed)
Mychart message sent.

## 2020-04-15 ENCOUNTER — Other Ambulatory Visit: Payer: Self-pay | Admitting: Endocrinology

## 2020-06-28 ENCOUNTER — Ambulatory Visit: Payer: 59 | Admitting: Endocrinology

## 2020-07-21 ENCOUNTER — Other Ambulatory Visit: Payer: Self-pay | Admitting: Family Medicine

## 2020-07-21 DIAGNOSIS — Z1231 Encounter for screening mammogram for malignant neoplasm of breast: Secondary | ICD-10-CM

## 2020-07-25 ENCOUNTER — Ambulatory Visit: Payer: 59 | Admitting: Endocrinology

## 2020-08-16 ENCOUNTER — Other Ambulatory Visit: Payer: Self-pay | Admitting: Endocrinology

## 2020-09-09 ENCOUNTER — Other Ambulatory Visit: Payer: Self-pay

## 2020-09-13 ENCOUNTER — Ambulatory Visit (INDEPENDENT_AMBULATORY_CARE_PROVIDER_SITE_OTHER): Payer: 59 | Admitting: Endocrinology

## 2020-09-13 ENCOUNTER — Other Ambulatory Visit: Payer: Self-pay | Admitting: Endocrinology

## 2020-09-13 ENCOUNTER — Encounter: Payer: Self-pay | Admitting: Endocrinology

## 2020-09-13 VITALS — BP 130/82 | HR 68 | Ht 66.5 in | Wt 214.0 lb

## 2020-09-13 DIAGNOSIS — E063 Autoimmune thyroiditis: Secondary | ICD-10-CM

## 2020-09-13 DIAGNOSIS — Z794 Long term (current) use of insulin: Secondary | ICD-10-CM

## 2020-09-13 DIAGNOSIS — E7800 Pure hypercholesterolemia, unspecified: Secondary | ICD-10-CM | POA: Diagnosis not present

## 2020-09-13 DIAGNOSIS — E1165 Type 2 diabetes mellitus with hyperglycemia: Secondary | ICD-10-CM | POA: Diagnosis not present

## 2020-09-13 LAB — LIPID PANEL
Cholesterol: 162 mg/dL (ref 0–200)
HDL: 73.2 mg/dL (ref >39.00)
LDL Cholesterol: 71 mg/dL (ref 0–99)
NonHDL: 89.11
Total CHOL/HDL Ratio: 2
Triglycerides: 91 mg/dL (ref 0.0–149.0)
VLDL: 18.2 mg/dL (ref 0.0–40.0)

## 2020-09-13 LAB — URINALYSIS, ROUTINE W REFLEX MICROSCOPIC
Bilirubin Urine: NEGATIVE
Hgb urine dipstick: NEGATIVE
Ketones, ur: NEGATIVE
Leukocytes,Ua: NEGATIVE
Nitrite: NEGATIVE
RBC / HPF: NONE SEEN (ref 0–?)
Specific Gravity, Urine: 1.02 (ref 1.000–1.030)
Total Protein, Urine: NEGATIVE
Urine Glucose: >=1000 — AB
Urobilinogen, UA: 0.2 (ref 0.0–1.0)
pH: 5 (ref 5.0–8.0)

## 2020-09-13 LAB — COMPREHENSIVE METABOLIC PANEL
ALT: 21 U/L (ref 0–35)
AST: 18 U/L (ref 0–37)
Albumin: 4.2 g/dL (ref 3.5–5.2)
Alkaline Phosphatase: 95 U/L (ref 39–117)
BUN: 8 mg/dL (ref 6–23)
CO2: 27 mEq/L (ref 19–32)
Calcium: 9.4 mg/dL (ref 8.4–10.5)
Chloride: 103 mEq/L (ref 96–112)
Creatinine, Ser: 0.97 mg/dL (ref 0.40–1.20)
GFR: 62.12 mL/min (ref >60.00)
Glucose, Bld: 126 mg/dL — ABNORMAL HIGH (ref 70–99)
Potassium: 3.2 mEq/L — ABNORMAL LOW (ref 3.5–5.1)
Sodium: 139 mEq/L (ref 135–145)
Total Bilirubin: 0.7 mg/dL (ref 0.2–1.2)
Total Protein: 7 g/dL (ref 6.0–8.3)

## 2020-09-13 LAB — POCT GLYCOSYLATED HEMOGLOBIN (HGB A1C): Hemoglobin A1C: 7.8 % — AB (ref 4.0–5.6)

## 2020-09-13 LAB — MICROALBUMIN / CREATININE URINE RATIO
Creatinine,U: 85 mg/dL
Microalb Creat Ratio: 0.8 mg/g (ref 0.0–30.0)
Microalb, Ur: <0.7 mg/dL (ref 0.0–1.9)

## 2020-09-13 LAB — TSH: TSH: 2.48 u[IU]/mL (ref 0.35–4.50)

## 2020-09-13 LAB — T4, FREE: Free T4: 0.96 ng/dL (ref 0.60–1.60)

## 2020-09-13 MED ORDER — FREESTYLE LIBRE 2 SENSOR MISC: 2.0000 | 3 refills | Status: DC

## 2020-09-13 NOTE — Patient Instructions (Signed)
Tresiba 34 daily  Humalog with all meals with carbs  Check blood sugars on waking up 3-4  days a week  Also check blood sugars about 2 hours after meals and do this after different meals by rotation  Recommended blood sugar levels on waking up are 90-130 and about 2 hours after meal is 130-160  Please bring your blood sugar monitor to each visit, thank you

## 2020-09-13 NOTE — Progress Notes (Signed)
Patient ID: [name], female   DOB: Sep 21, 1955, 65 y.o.   MRN: [id number]           Reason for Appointment: Follow-up    Referring physician: Kathryne Eriksson  History of Present Illness:          Diagnosis: Type 2 diabetes mellitus, date of diagnosis:  2010       Past history:  She was having symptoms of neuropathy at onset At that time she was started on metformin alone, 500 mg twice a day and this was continued unchanged until recently Apparently she was also given Onglyza about 4 years ago but details of her control at that time is not available Her A1c levels have been mild increase over the last year but she thinks her blood sugars had been generally good with only some readings up to 200. A1c was 7.1 in 1/15 and she was continued on metformin and Onglyza When her A1c was 7.9 in August she was told to double her metformin to 2 g daily She  had blood sugars of 200 or more prior to her initial consultation  Her Trulicity was changed to Victoza because of diarrhea and abdominal pain and has been able to tolerate Victoza  1.8 hs  When A1c was 7.6 she was started on Invokana 100 mg in 11/16, this was subsequently increased when her A1c was 7.5 in 10/17 She has been on insulin since 12/2016  Recent history:   INSULIN regimen: Tresiba  32 units daily in am, Humalog usually 6-12 units at suppertime  Non-insulin hypoglycemic drugs the patient is taking are:  metformin ER 2 g daily, Farxiga 10 mg daily, Ozempic 1 mg weekly  Her A1c is higher than usual at 7.8  Current blood sugar patterns and problems identified:  She says that her blood sugars are higher and she has gained weight from stress and depression  She checks her blood sugar somewhat erratically and does not like to prick her finger  Has had only a couple of good readings around bedtime but overall most of her sugars are above target both morning and late evening  She will sometimes not take insulin to cover her main  meal at suppertime especially the blood sugars are not high  She usually does have some carbohydrate at dinnertime but not at her breakfast  She has not done any walking lately because of being involved with the family  FASTING readings are still relatively high even with going up 4 units of Tresiba last time  Blood sugars may be as high as 309 with stress eating  Mealtimes: Breakfast 10 AM, dinner usually at 5 pm  Side effects from medications have been: Diarrhea, abdominal pain with Trulicity   Glucose monitoring:  done less than once  a day         Glucometer:        Blood Glucose readings from her meter download    PRE-MEAL Fasting Lunch Dinner Bedtime Overall  Glucose range: 146-198    107-309  Mean/median:  170    168  187   POST-MEAL PC Breakfast PC Lunch  10-12 PM  Glucose range:     Mean/median:    213    PREVIOUSLY:   PRE-MEAL Fasting Lunch Dinner Bedtime Overall  Glucose range:    107-209    Mean/median:      191   POST-MEAL PC Breakfast PC Lunch PC Dinner  Glucose range:  155-225   121-369  Mean/median:    210       Self-care: The diet that the patient has been following is: tries to limit carbohydrates .     Meals: 3 meals per day. Breakfast is yogurt/ cereal/eggs with toast at 9-10 am; , lunch is a sandwich, and dinner at 5 pm will have baked chicken, starch and vegetables.  Has snacks with yogurt, cottage cheese, fruit, nuts and  rice cakes               Dietician visit, most recent: 9/15              Weight history:  Wt Readings from Last 3 Encounters:  09/13/20 214 lb (97.1 kg)  03/28/20 206 lb (93.4 kg)  02/01/20 205 lb 9.6 oz (93.3 kg)    Glycemic control:     Lab Results  Component Value Date   HGBA1C 7.8 (A) 09/13/2020   HGBA1C 7.0 (A) 02/01/2020   HGBA1C 6.5 (A) 10/09/2019   Lab Results  Component Value Date   MICROALBUR <0.7 10/09/2019   LDLCALC 66 10/09/2019   CREATININE 0.95 03/28/2020       Allergies as of 09/13/2020       Reactions   Bisoprolol-hydrochlorothiazide Swelling, Other (See Comments)   Face and throat became swollen; wheezing also   Relafen [nabumetone] Hives, Shortness Of Breath   Aspirin Other (See Comments)   Irritates patient's IBS and Diverticulitis   Dulaglutide Other (See Comments)   Trulicity: Abdominal pain   Other Other (See Comments)   Seasonal allergies: Runny nose/eyes and congestion   Restasis [cyclosporine] Itching, Rash, Other (See Comments)   Eyes burn and rash develops around the eyes      Medication List       Accurate as of September 13, 2020  1:21 PM. If you have any questions, ask your nurse or doctor.        acetaminophen 650 MG CR tablet Commonly known as: TYLENOL Take 650-1,300 mg by mouth every 8 (eight) hours as needed (for pain or headaches).   albuterol 108 (90 Base) MCG/ACT inhaler Commonly known as: VENTOLIN HFA Inhale into the lungs.   ProAir HFA 108 (90 Base) MCG/ACT inhaler Generic drug: albuterol Inhale 2 puffs into the lungs every 6 (six) hours as needed.   ASPIRIN 81 PO Take 1 tablet by mouth daily.   atorvastatin 20 MG tablet Commonly known as: LIPITOR Take 1 tablet (20 mg total) by mouth daily.   butalbital-acetaminophen-caffeine 50-325-40 MG tablet Commonly known as: FIORICET Take 1 tablet by mouth every 6 (six) hours as needed for headache. Do not refill in less than 30 days.   Farxiga 10 MG Tabs tablet Generic drug: dapagliflozin propanediol TAKE 1 TABLET BY MOUTH EVERY DAY   gabapentin 600 MG tablet Commonly known as: NEURONTIN TAKE 1 TABLET 3 TIMES PER DAY AS NEEDED   insulin lispro 100 UNIT/ML KwikPen Commonly known as: HumaLOG KwikPen INJECT 8 UNITS SUBCUTANEOUSLY BEFORE MEALS   Insulin Pen Needle 32G X 4 MM Misc Use one per day to inject victoza   levothyroxine 25 MCG tablet Commonly known as: SYNTHROID Take 25 mcg by mouth daily before breakfast.   metFORMIN 500 MG 24 hr tablet Commonly known as:  GLUCOPHAGE-XR TAKE 2 TABLETS BY MOUTH TWO TIMES A DAY   methotrexate 2.5 MG tablet Commonly known as: RHEUMATREX Take 2.5 mg by mouth once a week. TAKE 10 TABLETS BY MOUTH AT ONE TIME EVERY Monday.   nitroGLYCERIN 0.4 MG SL  tablet Commonly known as: NITROSTAT Place 1 tablet (0.4 mg total) under the tongue every 5 (five) minutes as needed for chest pain.   OneTouch Delica Lancets 78I Misc Use OneTouch Delica Lancets to test blood sugars twice daily. DX: E11.49   OneTouch Verio Flex System w/Device Kit Use OneTouch Verio Flex to check blood sugar twice daily. DX: E11.49   OneTouch Verio test strip Generic drug: glucose blood Use OneTouch Verio test strips to test blood sugars twice daily. DX: E11.49   Ozempic (1 MG/DOSE) 2 MG/1.5ML Sopn Generic drug: Semaglutide (1 MG/DOSE) INJECT 1 MG INTO THE SKIN ONCE A WEEK.   Ozempic (1 MG/DOSE) 4 MG/3ML Sopn Generic drug: Semaglutide (1 MG/DOSE) INJECT 1MG INTO THE SKIN ONCE A WEEK   sertraline 100 MG tablet Commonly known as: ZOLOFT Take 200 mg by mouth daily. Take 2 tablets by mouth once daily.   Tyler Aas FlexTouch 100 UNIT/ML FlexTouch Pen Generic drug: insulin degludec INJECT 30 UNITS UNDER THE SKIN ONCE DAILY.       Allergies:  Allergies  Allergen Reactions  . Bisoprolol-Hydrochlorothiazide Swelling and Other (See Comments)    Face and throat became swollen; wheezing also  . Relafen [Nabumetone] Hives and Shortness Of Breath  . Aspirin Other (See Comments)    Irritates patient's IBS and Diverticulitis  . Dulaglutide Other (See Comments)    Trulicity: Abdominal pain  . Other Other (See Comments)    Seasonal allergies: Runny nose/eyes and congestion  . Restasis [Cyclosporine] Itching, Rash and Other (See Comments)    Eyes burn and rash develops around the eyes    Past Medical History:  Diagnosis Date  . Colon polyps   . Depression 05/10/2013  . Diabetes mellitus without complication ([place])   . 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 Diverticulosis   . Headache   . Hypertension   . Irritable bowel   . 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 Neuropathy   . Neuropathy associated with endocrine disorder ([place])   . Obesity (BMI 30-39.9)   . Osteopenia   . Visual disturbance     Past Surgical History:  Procedure Laterality Date  . arm surgery Right   . plantar faci      Family History  Problem Relation Age of Onset  . Diabetes type II Mother   . Stroke Mother   . Hypertension Mother   . Heart disease Father   . Hypertension Father   . Diabetes Father   . Heart attack Father   . Heart disease Brother   . Hypertension Brother   . Diabetes Brother   . Kidney disease Brother   . Diabetes Sister   . Asthma Maternal Grandmother   . Hypertension Maternal Grandmother   . Heart attack Maternal Grandmother   . Heart attack Maternal Grandfather   . Heart attack Paternal Grandmother   . Diabetes Paternal Grandfather   . Heart attack Paternal Grandfather   . Diabetes Brother   . Heart attack Brother   . Diabetes Brother   . Diabetes Brother   . Other Brother        Bright's Disease  . Leukemia Other        brother's grandson  . Breast cancer Neg Hx     Social History:  reports that she has never smoked. She has never used smokeless tobacco. She reports that she does not drink alcohol and does not use drugs.    Review of Systems  She is on methotrexate tablets from her ophthalmologist for inflammatory disease Her vision is better She has been monitored with chemistry panel and CBC every 3 months       Lipids: These have been controlled with Lipitor 20 mg daily as of 11/2018 and recent LDL 73 done in 7/21 by PCP       Lab Results  Component Value Date   CHOL 144 10/09/2019   HDL 58.50 10/09/2019   LDLCALC 66 10/09/2019   TRIG 96.0 10/09/2019   CHOLHDL 2 10/09/2019                  The blood pressure has been high for several years treated by PCP  Blood  pressure is controlled now without any lisinopril, apparently blood pressure was getting low with this  BP Readings from Last 3 Encounters:  09/13/20 130/82  03/28/20 118/80  02/01/20 118/70        She has a long history of Numbness, pain, tingling and burning in feet    Symptoms being treated with  gabapentin to 600 mg 3 times a day and she has usually good control of symptoms  Foot exam in 3/22 showed decreased monofilament sensation in the left toes  She has had fatigue for some time and PCP following her TSH to be 6.2, she has started taking 25 mcg levothyroxine but not getting much improvement in her fatigue  Lab Results  Component Value Date   TSH 2.32 03/28/2020   TSH 2.51 12/20/2016   TSH 3.156 05/11/2013   FREET4 0.79 03/28/2020     Physical Examination:  BP 130/82   Pulse 68   Ht 5' 6.5" (1.689 m)   Wt 214 lb (97.1 kg)   SpO2 98%   BMI 34.02 kg/m   Diabetic Foot Exam - Simple   Simple Foot Form Diabetic Foot exam was performed with the following findings: Yes   Visual Inspection No deformities, no ulcerations, no other skin breakdown bilaterally: Yes Sensation Testing See comments: Yes Pulse Check Posterior Tibialis and Dorsalis pulse intact bilaterally: Yes Comments 1+ ankle edema Decreased to absent monofilament sensation on the left toes both dorsally and on plantar surfaces       ASSESSMENT:  Diabetes type 2, insulin requiring with obesity  She is on a regimen of TRESIBA 32 units daily, Humalog twice daily, Ozempic 0.5 weekly,   metformin 2 g and Farxiga 10 mg  See history of present illness for detailed discussion of current diabetes management, blood sugar patterns and problems identified  Her A1c is 7.8 compared to 7%  Blood sugars are generally worse and inconsistently controlled from her poor diet, lack of exercise and weight gain Also not taking Humalog consistently at suppertime and likely not taking enough doses 2-hour postprandial  readings not available with her main meal in the evening  Also fasting readings are not controlled  Hypertension: Blood pressure is controlled even without lisinopril and will continue [place] alone    LIPIDS: Needs follow-up labs  Hypothyroidism: She has not subjectively felt any better with 25 mg levothyroxine started her PCP  We will recheck thyroid levels today    PLAN:   Increase Tresiba to 34 for now She needs to take Humalog consistently at suppertime even if blood sugars are normal The dose will be proportional to her carbohydrate intake and portion size To help assess her Humalog dosage she will need to check blood sugar more often For this she will start using the  freestyle libre sensor Discussed in detail how this is used, information provided and how to check the blood sugars; she can use her iPhone for this Start walking for exercise  She will discuss her depression with PCP and hopefully can cut back on portions and carbohydrates including snacks  Follow-up in 2 months    There are no Patient Instructions on file for this visit.      Elayne Snare 09/13/2020, 1:21 PM     Note: This office note was prepared with Dragon voice recognition system technology. Any transcriptional errors that result from this process are unintentional.

## 2020-09-14 NOTE — Progress Notes (Signed)
Potassium level is low everything else is okay.  Please send prescription for K. Dur 20 mEq as potassium supplement, 1 tablet daily for 15 days.  Recommend rechecking potassium in about a month with PCP

## 2020-09-26 ENCOUNTER — Other Ambulatory Visit: Payer: Self-pay

## 2020-09-26 ENCOUNTER — Ambulatory Visit
Admission: RE | Admit: 2020-09-26 | Discharge: 2020-09-26 | Disposition: A | Discharge status: Home / Self Care | Payer: 59 | Source: Ambulatory Visit | Attending: Family Medicine | Admitting: Family Medicine

## 2020-09-26 DIAGNOSIS — Z1231 Encounter for screening mammogram for malignant neoplasm of breast: Secondary | ICD-10-CM

## 2020-11-15 ENCOUNTER — Ambulatory Visit (INDEPENDENT_AMBULATORY_CARE_PROVIDER_SITE_OTHER): Payer: 59 | Admitting: Endocrinology

## 2020-11-15 ENCOUNTER — Encounter: Payer: Self-pay | Admitting: Endocrinology

## 2020-11-15 ENCOUNTER — Other Ambulatory Visit: Payer: Self-pay

## 2020-11-15 VITALS — BP 130/86 | HR 66 | Ht 67.0 in | Wt 214.6 lb

## 2020-11-15 DIAGNOSIS — I10 Essential (primary) hypertension: Secondary | ICD-10-CM | POA: Diagnosis not present

## 2020-11-15 DIAGNOSIS — E7800 Pure hypercholesterolemia, unspecified: Secondary | ICD-10-CM

## 2020-11-15 DIAGNOSIS — Z794 Long term (current) use of insulin: Secondary | ICD-10-CM | POA: Diagnosis not present

## 2020-11-15 DIAGNOSIS — E1165 Type 2 diabetes mellitus with hyperglycemia: Secondary | ICD-10-CM | POA: Diagnosis not present

## 2020-11-15 DIAGNOSIS — Z6833 Body mass index (BMI) 33.0-33.9, adult: Secondary | ICD-10-CM | POA: Diagnosis not present

## 2020-11-15 NOTE — Patient Instructions (Addendum)
Humalog 14U 2x daily Adjust based on meal size and sugar level  Check sugar 4x daily  Call when needing Ozempic, need 2mg  dose

## 2020-11-15 NOTE — Progress Notes (Signed)
Patient ID: [name], female   DOB: [birth year], 65 y.o.   MRN: [id number]           Reason for Appointment: Follow-up    Referring physician: Kathryne Eriksson  History of Present Illness:          Diagnosis: Type 2 diabetes mellitus, date of diagnosis:  2010       Past history:  She was having symptoms of neuropathy at onset At that time she was started on metformin alone, 500 mg twice a day and this was continued unchanged until recently Apparently she was also given Onglyza about 4 years ago but details of her control at that time is not available Her A1c levels have been mild increase over the last year but she thinks her blood sugars had been generally good with only some readings up to 200. A1c was 7.1 in 1/15 and she was continued on metformin and Onglyza When her A1c was 7.9 in August she was told to double her metformin to 2 g daily She  had blood sugars of 200 or more prior to her initial consultation  Her Trulicity was changed to Victoza because of diarrhea and abdominal pain and has been able to tolerate Victoza  1.8 hs  When A1c was 7.6 she was started on Invokana 100 mg in 11/16, this was subsequently increased when her A1c was 7.5 in 10/17 She has been on insulin since 12/2016  Recent history:   INSULIN regimen: Tresiba  32 units daily in am, Humalog usually 6-12 units at suppertime  Non-insulin hypoglycemic drugs the patient is taking are:  metformin ER 2 g daily, Farxiga 10 mg daily, Ozempic 1 mg weekly  Her A1c is last higher than usual at 7.8  Current blood sugar patterns and problems identified:  She says that her blood sugars are higher from stress and not taking time to take care of herself  As before she has difficulty monitoring her blood sugars consistently  This is despite using the freestyle libre sensor and she is not checking it consistently  More recently has changed is more often associated with high readings after a steroid injection on  4/26  However still continues to have significant postprandial hyperglycemia with progressively higher readings in the afternoon  Difficult to assess fasting readings because of inadequate monitoring  Also likely not planning her meals well  Mealtimes: Breakfast 10 AM, dinner usually at 5 pm  Side effects from medications have been: Diarrhea, abdominal pain with Trulicity  Interpretation of CGM download:   Blood sugar data diagnostic data especially overnight  Blood sugars are moderately high fasting and go progressively higher until at least 5 PM  Also has some increase in blood sugars after 7 PM with tendency to coming down around midnight  No hypoglycemia except appearing to be low on 4/28, not clear if this is artifact  Postprandial readings appear to be higher after her first meal next morning and some after evening meal  Overnight blood sugars appear to be somewhat variable when data is available  CGM use % of time  39  2-week average/GV  214/30  Time in range  32     %  % Time Above 180  37  % Time above 250  31  % Time Below 70 0     PRE-MEAL Fasting Lunch Dinner Bedtime Overall  Glucose range:       Averages:  158   216     POST-MEAL  PC Breakfast PC Lunch PC Dinner  Glucose range:     Averages:  232   242   Previously:   PRE-MEAL Fasting Lunch Dinner Bedtime Overall  Glucose range: 146-198    107-309  Mean/median:  170    168  187   POST-MEAL PC Breakfast PC Lunch  10-12 PM  Glucose range:     Mean/median:    213      Self-care: The diet that the patient has been following is: tries to limit carbohydrates .     Meals: 3 meals per day. Breakfast is yogurt/ cereal/eggs with toast at 9-10 am; , lunch is a sandwich, and dinner at 5 pm will have baked chicken, starch and vegetables.  Has snacks with yogurt, cottage cheese, fruit, nuts and  rice cakes               Dietician visit, most recent: 9/15              Weight history:  Wt Readings from  Last 3 Encounters:  11/15/20 214 lb 9.6 oz (97.3 kg)  09/13/20 214 lb (97.1 kg)  03/28/20 206 lb (93.4 kg)    Glycemic control:     Lab Results  Component Value Date   HGBA1C 7.8 (A) 09/13/2020   HGBA1C 7.0 (A) 02/01/2020   HGBA1C 6.5 (A) 10/09/2019   Lab Results  Component Value Date   MICROALBUR <0.7 09/13/2020   LDLCALC 71 09/13/2020   CREATININE 0.97 09/13/2020       Allergies as of 11/15/2020      Reactions   Bisoprolol-hydrochlorothiazide Swelling, Other (See Comments)   Face and throat became swollen; wheezing also   Relafen [nabumetone] Hives, Shortness Of Breath   Aspirin Other (See Comments)   Irritates patient's IBS and Diverticulitis   Dulaglutide Other (See Comments)   Trulicity: Abdominal pain   Other Other (See Comments)   Seasonal allergies: Runny nose/eyes and congestion   Restasis [cyclosporine] Itching, Rash, Other (See Comments)   Eyes burn and rash develops around the eyes      Medication List       Accurate as of Nov 15, 2020  2:27 PM. If you have any questions, ask your nurse or doctor.        acetaminophen 650 MG CR tablet Commonly known as: TYLENOL Take 650-1,300 mg by mouth every 8 (eight) hours as needed (for pain or headaches).   albuterol 108 (90 Base) MCG/ACT inhaler Commonly known as: VENTOLIN HFA Inhale into the lungs.   ProAir HFA 108 (90 Base) MCG/ACT inhaler Generic drug: albuterol Inhale 2 puffs into the lungs every 6 (six) hours as needed.   ASPIRIN 81 PO Take 1 tablet by mouth daily.   atorvastatin 20 MG tablet Commonly known as: LIPITOR Take 1 tablet (20 mg total) by mouth daily.   butalbital-acetaminophen-caffeine 50-325-40 MG tablet Commonly known as: FIORICET Take 1 tablet by mouth every 6 (six) hours as needed for headache. Do not refill in less than 30 days.   Farxiga 10 MG Tabs tablet Generic drug: dapagliflozin propanediol TAKE 1 TABLET BY MOUTH EVERY DAY   FreeStyle Libre 2 Sensor Misc 2 Devices by  Does not apply route every 14 (fourteen) days.   gabapentin 600 MG tablet Commonly known as: NEURONTIN TAKE 1 TABLET 3 TIMES PER DAY AS NEEDED   insulin lispro 100 UNIT/ML KwikPen Commonly known as: HumaLOG KwikPen INJECT 8 UNITS SUBCUTANEOUSLY BEFORE MEALS   Insulin Pen Needle 32G X 4  MM Misc Use one per day to inject victoza   levothyroxine 25 MCG tablet Commonly known as: SYNTHROID Take 25 mcg by mouth daily before breakfast.   metFORMIN 500 MG 24 hr tablet Commonly known as: GLUCOPHAGE-XR TAKE 2 TABLETS BY MOUTH TWO TIMES A DAY   methotrexate 2.5 MG tablet Commonly known as: RHEUMATREX Take 2.5 mg by mouth once a week. TAKE 10 TABLETS BY MOUTH AT ONE TIME EVERY Monday.   nitroGLYCERIN 0.4 MG SL tablet Commonly known as: NITROSTAT Place 1 tablet (0.4 mg total) under the tongue every 5 (five) minutes as needed for chest pain.   OneTouch Delica Lancets 09U Misc Use OneTouch Delica Lancets to test blood sugars twice daily. DX: E11.49   OneTouch Verio Flex System w/Device Kit Use OneTouch Verio Flex to check blood sugar twice daily. DX: E11.49   OneTouch Verio test strip Generic drug: glucose blood Use OneTouch Verio test strips to test blood sugars twice daily. DX: E11.49   Ozempic (1 MG/DOSE) 2 MG/1.5ML Sopn Generic drug: Semaglutide (1 MG/DOSE) INJECT 1 MG INTO THE SKIN ONCE A WEEK.   Ozempic (1 MG/DOSE) 4 MG/3ML Sopn Generic drug: Semaglutide (1 MG/DOSE) INJECT 1MG INTO THE SKIN ONCE A WEEK   sertraline 100 MG tablet Commonly known as: ZOLOFT Take 200 mg by mouth daily. Take 2 tablets by mouth once daily.   Tyler Aas FlexTouch 100 UNIT/ML FlexTouch Pen Generic drug: insulin degludec INJECT 30 UNITS UNDER THE SKIN ONCE DAILY.       Allergies:  Allergies  Allergen Reactions  . Bisoprolol-Hydrochlorothiazide Swelling and Other (See Comments)    Face and throat became swollen; wheezing also  . Relafen [Nabumetone] Hives and Shortness Of Breath  . Aspirin  Other (See Comments)    Irritates patient's IBS and Diverticulitis  . Dulaglutide Other (See Comments)    Trulicity: Abdominal pain  . Other Other (See Comments)    Seasonal allergies: Runny nose/eyes and congestion  . Restasis [Cyclosporine] Itching, Rash and Other (See Comments)    Eyes burn and rash develops around the eyes    Past Medical History:  Diagnosis Date  . Colon polyps   . Depression 05/10/2013  . Diabetes mellitus without complication ([place])   . 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 Diverticulosis   . Headache   . Hypertension   . Irritable bowel   . 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 Neuropathy   . Neuropathy associated with endocrine disorder ([place])   . Obesity (BMI 30-39.9)   . Osteopenia   . Visual disturbance     Past Surgical History:  Procedure Laterality Date  . arm surgery Right   . plantar faci      Family History  Problem Relation Age of Onset  . Diabetes type II Mother   . Stroke Mother   . Hypertension Mother   . Heart disease Father   . Hypertension Father   . Diabetes Father   . Heart attack Father   . Heart disease Brother   . Hypertension Brother   . Diabetes Brother   . Kidney disease Brother   . Diabetes Sister   . Asthma Maternal Grandmother   . Hypertension Maternal Grandmother   . Heart attack Maternal Grandmother   . Heart attack Maternal Grandfather   . Heart attack Paternal Grandmother   . Diabetes Paternal Grandfather   . Heart attack Paternal Grandfather   . Diabetes Brother   . Heart attack Brother   .  Diabetes Brother   . Diabetes Brother   . Other Brother        Bright's Disease  . Leukemia Other        brother's grandson  . Breast cancer Neg Hx     Social History:  reports that she has never smoked. She has never used smokeless tobacco. She reports that she does not drink alcohol and does not use drugs.    Review of Systems    She is on methotrexate tablets from her  ophthalmologist for inflammatory disease Her vision is improved       Lipids: These have been controlled with Lipitor 20 mg daily as of 11/2018 and recent LDL 73 done in 7/21 by PCP       Lab Results  Component Value Date   CHOL 162 09/13/2020   HDL 73.20 09/13/2020   LDLCALC 71 09/13/2020   TRIG 91.0 09/13/2020   CHOLHDL 2 09/13/2020                  The blood pressure has been high for several years treated by PCP  Currently not on any treatment Does take Farxiga  BP Readings from Last 3 Encounters:  11/15/20 130/86  09/13/20 130/82  03/28/20 118/80        She has a long history of Numbness, pain, tingling and burning in feet    Symptoms being treated with  gabapentin to 600 mg 3 times a day and she has usually good control of symptoms  Foot exam in 3/22 showed decreased monofilament sensation in the left toes  She has had fatigue for some time and PCP following her TSH to be 6.2, she has started taking 25 mcg levothyroxine Still has fatigue  Lab Results  Component Value Date   TSH 2.48 09/13/2020   TSH 2.32 03/28/2020   TSH 2.51 12/20/2016   FREET4 0.96 09/13/2020   FREET4 0.79 03/28/2020     Physical Examination:  BP 130/86   Pulse 66   Ht _0  (1.702 m)   Wt 214 lb 9.6 oz (97.3 kg)   SpO2 99%   BMI 33.61 kg/m       ASSESSMENT:  Diabetes type 2, insulin requiring with obesity  She is on a regimen of TRESIBA 32 units daily, Humalog twice daily, Ozempic 0.5 weekly,   metformin 2 g and Farxiga 10 mg  See history of present illness for detailed discussion of current diabetes management, blood sugar patterns and problems identified  Her A1c is 7.8 compared to 7%  Blood sugars are now being monitored with the freestyle libre which appears to be relatively accurate However although limited data is available she still has high postprandial readings This is despite taking [place] and Ozempic also She can still do better with lifestyle changes like diet  and exercise but these are limited because of social factors  Hypertension: Blood pressure is currently high normal but may be related to pain today   Hypothyroidism: She has normal TSH    PLAN:   She will try to work with her PCP to get her shoulder pain resolved which will help reduce stress She can start walking for exercise when able to She will at least go to 14 units of Humalog at breakfast and adjust to keep postprandial readings under 180 at least We will try her on 2 mg Ozempic instead of 1 mg and she can take her abdominal injection next week and can call for new prescription  if tolerated No change in [place] If her morning sugars start going up she will need to go up further on Tresiba Try to check blood sugar several times a day especially to assess fasting and postprandial readings consistently   Follow-up in 2 months    Patient Instructions  Humalog 14U 2x daily Adjust based on meal size and sugar level  Check sugar 4x daily  Call when needing Ozempic, need 77m dose        AElayne Snare5/09/2020, 2:27 PM     Note: This office note was prepared with Dragon voice recognition system technology. Any transcriptional errors that result from this process are unintentional.

## 2020-12-24 ENCOUNTER — Other Ambulatory Visit: Payer: Self-pay | Admitting: Endocrinology

## 2020-12-28 ENCOUNTER — Other Ambulatory Visit: Payer: Self-pay | Admitting: Endocrinology

## 2020-12-28 DIAGNOSIS — E1165 Type 2 diabetes mellitus with hyperglycemia: Secondary | ICD-10-CM

## 2020-12-31 ENCOUNTER — Other Ambulatory Visit: Payer: Self-pay | Admitting: Endocrinology

## 2021-01-09 ENCOUNTER — Other Ambulatory Visit: Payer: Self-pay

## 2021-01-09 ENCOUNTER — Ambulatory Visit (INDEPENDENT_AMBULATORY_CARE_PROVIDER_SITE_OTHER): Payer: 59 | Admitting: Endocrinology

## 2021-01-09 VITALS — BP 122/82 | HR 64 | Ht 66.5 in | Wt 213.4 lb

## 2021-01-09 DIAGNOSIS — Z794 Long term (current) use of insulin: Secondary | ICD-10-CM | POA: Diagnosis not present

## 2021-01-09 DIAGNOSIS — E063 Autoimmune thyroiditis: Secondary | ICD-10-CM | POA: Diagnosis not present

## 2021-01-09 DIAGNOSIS — E1165 Type 2 diabetes mellitus with hyperglycemia: Secondary | ICD-10-CM

## 2021-01-09 LAB — POCT GLYCOSYLATED HEMOGLOBIN (HGB A1C): Hemoglobin A1C: 7.5 % — AB (ref 4.0–5.6)

## 2021-01-09 NOTE — Patient Instructions (Addendum)
Tresiba 30 units  Take 10-14 Humalog at start of supper  Walk daily  Check at least 3x daily  Extra 6-10 Humalog 3-4x daily when sugar >250  Also add to [place] if high > 24hrs

## 2021-01-09 NOTE — Progress Notes (Signed)
Patient ID: [name], female   DOB: 23-Jan-1956, 65 y.o.   MRN: [id number]           Reason for Appointment: Follow-up    Referring physician: Kathryne Eriksson  History of Present Illness:          Diagnosis: Type 2 diabetes mellitus, date of diagnosis:  2010       Past history:  She was having symptoms of neuropathy at onset At that time she was started on metformin alone, 500 mg twice a day and this was continued unchanged until recently Apparently she was also given Onglyza about 4 years ago but details of her control at that time is not available Her A1c levels have been mild increase over the last year but she thinks her blood sugars had been generally good with only some readings up to 200. A1c was 7.1 in 1/15 and she was continued on metformin and Onglyza When her A1c was 7.9 in August she was told to double her metformin to 2 g daily She  had blood sugars of 200 or more prior to her initial consultation  Her Trulicity was changed to Victoza because of diarrhea and abdominal pain and has been able to tolerate Victoza  1.8 hs  When A1c was 7.6 she was started on Invokana 100 mg in 11/16, this was subsequently increased when her A1c was 7.5 in 10/17 She has been on insulin since 12/2016  Recent history:   INSULIN regimen: Tresiba  32 units daily in am, Humalog usually 14 units at suppertime  Non-insulin hypoglycemic drugs the patient is taking are:  metformin ER 2 g daily, Farxiga 10 mg daily, Ozempic 2 mg weekly  Her A1c is 7.5  Current blood sugar patterns and problems identified: She has better blood sugars compared to her last visit This is likely to be from more consistently with her insulin and diet Previously also had high sugars from steroid injections in April As before she has difficulty monitoring her blood sugars consistently She also may check her sugars only in the morning or in the afternoon only and not always after supper which is her main meal Since she was  having a lot of postprandial hyperglycemia she was told to go up to 14 at suppertime on her Humalog dose previously. She still has a couple of significantly high readings the previous week possibly from missing her Premeal insulin dose However she will sometimes have smaller portions and less carbohydrates and will have low normal or mildly low sugars 2 to 4 hours later  She is not adjusting her mealtime dose based on what she is eating now more recently has only a couple of readings available after evening meal to review She thinks her freestyle Elenor Legato is relatively accurate compared to fingersticks even when low  Mealtimes: Breakfast 10 AM, dinner usually at 5 pm  Side effects from medications have been: Diarrhea, abdominal pain with Trulicity  Interpretation of CGM download:  Blood sugar data is incomplete with CGM active only 22% of the time Discussion as above  CGM use % of time   2-week average/GV 145  Time in range      83% %  % Time Above 180   % Time above 250   % Time Below 70       PRIOR   CGM use % of time  39  2-week average/GV  214/30  Time in range  32     %  %  Time Above 180  37  % Time above 250  31  % Time Below 70 0     PRE-MEAL Fasting Lunch Dinner Bedtime Overall  Glucose range:       Averages:  158   216     POST-MEAL PC Breakfast PC Lunch PC Dinner  Glucose range:     Averages:  232   242      Self-care: The diet that the patient has been following is: tries to limit carbohydrates .     Meals: 3 meals per day. Breakfast is yogurt/ cereal/eggs with toast at 9-10 am; , lunch is a sandwich, and dinner at 5 pm will have baked chicken, starch and vegetables.  Has snacks with yogurt, cottage cheese, fruit, nuts and  rice cakes               Dietician visit, most recent: 9/15              Weight history:  Wt Readings from Last 3 Encounters:  01/09/21 213 lb 6.4 oz (96.8 kg)  11/15/20 214 lb 9.6 oz (97.3 kg)  09/13/20 214 lb (97.1 kg)     Glycemic control:     Lab Results  Component Value Date   HGBA1C 7.5 (A) 01/09/2021   HGBA1C 7.8 (A) 09/13/2020   HGBA1C 7.0 (A) 02/01/2020   Lab Results  Component Value Date   MICROALBUR <0.7 09/13/2020   LDLCALC 71 09/13/2020   CREATININE 0.97 09/13/2020       Allergies as of 01/09/2021       Reactions   Bisoprolol-hydrochlorothiazide Swelling, Other (See Comments)   Face and throat became swollen; wheezing also   Relafen [nabumetone] Hives, Shortness Of Breath   Aspirin Other (See Comments)   Irritates patient's IBS and Diverticulitis   Dulaglutide Other (See Comments)   Trulicity: Abdominal pain   Other Other (See Comments)   Seasonal allergies: Runny nose/eyes and congestion   Restasis [cyclosporine] Itching, Rash, Other (See Comments)   Eyes burn and rash develops around the eyes        Medication List        Accurate as of January 09, 2021 11:59 PM. If you have any questions, ask your nurse or doctor.          acetaminophen 650 MG CR tablet Commonly known as: TYLENOL Take 650-1,300 mg by mouth every 8 (eight) hours as needed (for pain or headaches).   albuterol 108 (90 Base) MCG/ACT inhaler Commonly known as: VENTOLIN HFA Inhale into the lungs.   ProAir HFA 108 (90 Base) MCG/ACT inhaler Generic drug: albuterol Inhale 2 puffs into the lungs every 6 (six) hours as needed.   ASPIRIN 81 PO Take 1 tablet by mouth daily.   atorvastatin 20 MG tablet Commonly known as: LIPITOR Take 1 tablet (20 mg total) by mouth daily.   butalbital-acetaminophen-caffeine 50-325-40 MG tablet Commonly known as: FIORICET Take 1 tablet by mouth every 6 (six) hours as needed for headache. Do not refill in less than 30 days.   Farxiga 10 MG Tabs tablet Generic drug: dapagliflozin propanediol TAKE 1 TABLET BY MOUTH EVERY DAY   FreeStyle Libre 2 Sensor Misc APPLY EVERY 14 (FOURTEEN) DAYS AS DIRECTED   gabapentin 600 MG tablet Commonly known as: NEURONTIN TAKE 1  TABLET 3 TIMES PER DAY AS NEEDED   insulin lispro 100 UNIT/ML KwikPen Commonly known as: HumaLOG KwikPen INJECT 8 UNITS SUBCUTANEOUSLY BEFORE MEALS   Insulin Pen Needle 32G X 4  MM Misc Use one per day to inject victoza   levothyroxine 25 MCG tablet Commonly known as: SYNTHROID Take 25 mcg by mouth daily before breakfast.   metFORMIN 500 MG 24 hr tablet Commonly known as: GLUCOPHAGE-XR TAKE 2 TABLETS BY MOUTH TWO TIMES A DAY   methocarbamol 500 MG tablet Commonly known as: ROBAXIN Take 1 tablet by mouth daily.   methotrexate 2.5 MG tablet Commonly known as: RHEUMATREX Take 2.5 mg by mouth once a week. TAKE 10 TABLETS BY MOUTH AT ONE TIME EVERY Monday.   nitroGLYCERIN 0.4 MG SL tablet Commonly known as: NITROSTAT Place 1 tablet (0.4 mg total) under the tongue every 5 (five) minutes as needed for chest pain.   OneTouch Delica Lancets 16X Misc Use OneTouch Delica Lancets to test blood sugars twice daily. DX: E11.49   OneTouch Verio Flex System w/Device Kit Use OneTouch Verio Flex to check blood sugar twice daily. DX: E11.49   OneTouch Verio test strip Generic drug: glucose blood Use OneTouch Verio test strips to test blood sugars twice daily. DX: E11.49   Ozempic (1 MG/DOSE) 4 MG/3ML Sopn Generic drug: Semaglutide (1 MG/DOSE) Inject 2 mg into the skin once a week.   sertraline 100 MG tablet Commonly known as: ZOLOFT Take 200 mg by mouth daily. Take 2 tablets by mouth once daily.   Tyler Aas FlexTouch 100 UNIT/ML FlexTouch Pen Generic drug: insulin degludec INJECT 30 UNITS UNDER THE SKIN ONCE DAILY.        Allergies:  Allergies  Allergen Reactions   Bisoprolol-Hydrochlorothiazide Swelling and Other (See Comments)    Face and throat became swollen; wheezing also   Relafen [Nabumetone] Hives and Shortness Of Breath   Aspirin Other (See Comments)    Irritates patient's IBS and Diverticulitis   Dulaglutide Other (See Comments)    Trulicity: Abdominal pain   Other  Other (See Comments)    Seasonal allergies: Runny nose/eyes and congestion   Restasis [Cyclosporine] Itching, Rash and Other (See Comments)    Eyes burn and rash develops around the eyes    Past Medical History:  Diagnosis Date   Colon polyps    Depression 05/10/2013   Diabetes mellitus without complication ([place])    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[place])    Diverticulosis    Headache    Hypertension    Irritable bowel    Kidney disease, chronic, stage II (GFR 60-89 ml/min)    Neuropathy    Neuropathy associated with endocrine disorder ([place])    Obesity (BMI 30-39.9)    Osteopenia    Visual disturbance     Past Surgical History:  Procedure Laterality Date   arm surgery Right    plantar faci      Family History  Problem Relation Age of Onset   Diabetes type II Mother    Stroke Mother    Hypertension Mother    Heart disease Father    Hypertension Father    Diabetes Father    Heart attack Father    Heart disease Brother    Hypertension Brother    Diabetes Brother    Kidney disease Brother    Diabetes Sister    Asthma Maternal Grandmother    Hypertension Maternal Grandmother    Heart attack Maternal Grandmother    Heart attack Maternal Grandfather    Heart attack Paternal Grandmother    Diabetes Paternal Grandfather    Heart attack Paternal Grandfather    Diabetes Brother    Heart attack Brother    Diabetes Brother  Diabetes Brother    Other Brother        Bright's Disease   Leukemia Other        brother's grandson   Breast cancer Neg Hx     Social History:  reports that she has never smoked. She has never used smokeless tobacco. She reports that she does not drink alcohol and does not use drugs.    Review of Systems    She is on methotrexate tablets from her ophthalmologist for inflammatory disease Her vision is improved       Lipids: These have been controlled with Lipitor 20 mg daily as of 11/2018 and recent LDL 73 done in 7/21 by PCP       Lab  Results  Component Value Date   CHOL 162 09/13/2020   HDL 73.20 09/13/2020   LDLCALC 71 09/13/2020   TRIG 91.0 09/13/2020   CHOLHDL 2 09/13/2020                  The blood pressure has been high for several years treated by PCP  Currently not on any treatment, previously lisinopril stopped 7 because of low blood pressure Does take Farxiga  BP Readings from Last 3 Encounters:  01/09/21 122/82  11/15/20 130/86  09/13/20 130/82        She has a long history of Numbness, pain, tingling and burning in feet    Symptoms being treated with  gabapentin to 600 mg 3 times a day and she has usually good control of symptoms  Foot exam in 3/22 showed decreased monofilament sensation in the left toes   Her TSH baseline was 6.2, she has been given 25 mcg levothyroxine by her PCP   Lab Results  Component Value Date   TSH 2.48 09/13/2020   TSH 2.32 03/28/2020   TSH 2.51 12/20/2016   FREET4 0.96 09/13/2020   FREET4 0.79 03/28/2020     Physical Examination:  BP 122/82   Pulse 64   Ht 5' 6.5" (1.689 m)   Wt 213 lb 6.4 oz (96.8 kg)   SpO2 99%   BMI 33.93 kg/m       ASSESSMENT:  Diabetes type 2, insulin requiring with obesity  She is on a regimen of TRESIBA 32 units daily, Humalog twice daily, Ozempic 2 weekly,   metformin 2 g and Farxiga 10 mg  See history of present illness for detailed discussion of current diabetes management, blood sugar patterns and problems identified  Her A1c is about the same at 7.5 Recent blood sugars appear to be generally better with increasing her Ozempic  Problems identified today:  Not enough blood sugar monitoring despite using freestyle libre, occasional tendency to low normal or low sugars not adjusting her mealtime dose based on what she is eating, late or missed mealtime coverage at times Only occasional low normal blood sugars overnight indicating excessive basal insulin but data is incomplete   Hypertension: Blood pressure is still  normal without any medications    PLAN:   She needs to adjust her Humalog based on what she is eating and carbohydrate content, adjust between 10 to 14 units She needs to make sure she takes insulin before eating She will take her insulin with her when she is going out to eat Start walking for exercise when able to Continue to milligram Ozempic Reduce Tresiba to 30 units for now Keep her blood sugar checked after meals more often now there should be at least under 180 To  call if having any low sugars overnight  Discussed stress doses of both insulin doses if she has steroid induced hyperglycemia with blood sugars going on about 250 She will call if she has any issues with inadequate control  Total visit time including counseling = 30 minutes  Patient Instructions  Tresiba 30 units  Take 10-14 Humalog at start of supper  Walk daily  Check at least 3x daily  Extra 6-10 Humalog 3-4x daily when sugar >250  Also add to [place] if high > 24hrs      Elayne Snare 01/10/2021, 8:14 AM     Note: This office note was prepared with Dragon voice recognition system technology. Any transcriptional errors that result from this process are unintentional.

## 2021-01-10 MED ORDER — OZEMPIC (2 MG/DOSE) 8 MG/3ML ~~LOC~~ SOPN: PEN_INJECTOR | SUBCUTANEOUS | 1 refills | Status: DC

## 2021-01-12 DIAGNOSIS — M7502 Adhesive capsulitis of left shoulder: Secondary | ICD-10-CM | POA: Insufficient documentation

## 2021-02-22 ENCOUNTER — Other Ambulatory Visit: Payer: Self-pay | Admitting: Endocrinology

## 2021-02-22 DIAGNOSIS — E1165 Type 2 diabetes mellitus with hyperglycemia: Secondary | ICD-10-CM

## 2021-04-13 ENCOUNTER — Ambulatory Visit: Payer: 59 | Admitting: Endocrinology

## 2021-04-18 ENCOUNTER — Other Ambulatory Visit: Payer: Self-pay | Admitting: Endocrinology

## 2021-05-06 ENCOUNTER — Other Ambulatory Visit: Payer: Self-pay | Admitting: Endocrinology

## 2021-05-06 DIAGNOSIS — E1165 Type 2 diabetes mellitus with hyperglycemia: Secondary | ICD-10-CM

## 2021-05-06 DIAGNOSIS — Z794 Long term (current) use of insulin: Secondary | ICD-10-CM

## 2021-07-24 ENCOUNTER — Encounter: Payer: Self-pay | Admitting: Endocrinology

## 2021-07-24 ENCOUNTER — Other Ambulatory Visit: Payer: Self-pay

## 2021-07-24 ENCOUNTER — Ambulatory Visit (INDEPENDENT_AMBULATORY_CARE_PROVIDER_SITE_OTHER): Payer: 59 | Admitting: Endocrinology

## 2021-07-24 VITALS — BP 118/82 | HR 81 | Ht 66.0 in | Wt 205.6 lb

## 2021-07-24 DIAGNOSIS — E063 Autoimmune thyroiditis: Secondary | ICD-10-CM

## 2021-07-24 DIAGNOSIS — Z794 Long term (current) use of insulin: Secondary | ICD-10-CM | POA: Diagnosis not present

## 2021-07-24 DIAGNOSIS — R5383 Other fatigue: Secondary | ICD-10-CM

## 2021-07-24 DIAGNOSIS — E1165 Type 2 diabetes mellitus with hyperglycemia: Secondary | ICD-10-CM | POA: Diagnosis not present

## 2021-07-24 LAB — POCT GLYCOSYLATED HEMOGLOBIN (HGB A1C): Hemoglobin A1C: 7.6 % — AB (ref 4.0–5.6)

## 2021-07-24 LAB — TSH: TSH: 1.92 u[IU]/mL (ref 0.35–5.50)

## 2021-07-24 MED ORDER — FREESTYLE LIBRE 3 SENSOR MISC: 1.0000 | 2 refills | Status: DC

## 2021-07-24 NOTE — Progress Notes (Signed)
Patient ID: [name], female   DOB: September 09, 1955, 66 y.o.   MRN: [id number]           Reason for Appointment: Follow-up    Referring physician: Kathryne Eriksson  History of Present Illness:          Diagnosis: Type 2 diabetes mellitus, date of diagnosis:  2010       Past history:  She was having symptoms of neuropathy at onset At that time she was started on metformin alone, 500 mg twice a day and this was continued unchanged until recently Apparently she was also given Onglyza about 4 years ago but details of her control at that time is not available Her A1c levels have been mild increase over the last year but she thinks her blood sugars had been generally good with only some readings up to 200. A1c was 7.1 in 1/15 and she was continued on metformin and Onglyza When her A1c was 7.9 in August she was told to double her metformin to 2 g daily She  had blood sugars of 200 or more prior to her initial consultation  Her Trulicity was changed to Victoza because of diarrhea and abdominal pain and has been able to tolerate Victoza  1.8 hs  When A1c was 7.6 she was started on Invokana 100 mg in 11/16, this was subsequently increased when her A1c was 7.5 in 10/17 She has been on insulin since 12/2016  Recent history:   INSULIN regimen: Tresiba  30 units daily in am, Humalog usually 6  in am and 12 units at suppertime  Non-insulin hypoglycemic drugs the patient is taking are:  metformin ER 2 g daily, Farxiga 10 mg daily, Ozempic 2 mg weekly  Her A1c is 7.6 compared to 7.5  Current blood sugar patterns and problems identified: She has much worse blood sugars compared to her last visit which was in June She also has lost weight from intercurrent illnesses including COVID and influenza She forgets to check her sugars consistently and has less than 40% active time on her sensor  She says she will take 6 units of Humalog in the morning to bring her blood sugar down even though she is not eating  breakfast  Most of her sugars are high overnight compared to last time on her Tyler Aas was decreased slightly  She is still appearing to be figuring out her insulin dose for Humalog at dinnertime based on Premeal blood sugars rather than what she is eating She is not able to control carbohydrates because of stress  Has significant variability after dinner based on her diet and not clear if she has missed some doses of Humalog at dinnertime  Mealtimes: Breakfast 10 AM, dinner usually at 5 pm  Side effects from medications have been: Diarrhea, abdominal pain with Trulicity  Interpretation of CGM download:  Blood sugar data is incomplete with CGM active only 39 % of the time She is using the [place] intermittently and only given day usually does not have complete data OVERNIGHT blood sugars are mostly high with average over 200 around midnight and some improvement by morning Fasting blood sugar was 120 only once but generally high Since she is only eating mostly 1 meal a day postprandial readings are not always assessed but are variably high after dinner with a couple of very significant rise in blood sugar last week Blood sugars during the day appear to be relatively stable No hypoglycemia    CGM use % of time  39  2-week average/GV 195/33  Time in range      53  %  % Time Above 180 28+19  % Time above 250   % Time Below 70 0     PRE-MEAL Fasting Lunch Dinner Bedtime Overall  Glucose range:       Averages: 155 162 177  195   POST-MEAL PC Breakfast PC Lunch PC Dinner  Glucose range:     Averages:  187 261    Prior   CGM use % of time   2-week average/GV 145  Time in range      83% %  % Time Above 180   % Time above 250   % Time Below 70       Self-care: The diet that the patient has been following is: tries to limit carbohydrates .     Meals: 3 meals per day. Breakfast is yogurt/ cereal/eggs with toast at 9-10 am; , lunch is a sandwich, and dinner at 5 pm will have baked  chicken, starch and vegetables.  Has snacks with yogurt, cottage cheese, fruit, nuts and  rice cakes               Dietician visit, most recent: 9/15              Weight history:  Wt Readings from Last 3 Encounters:  07/24/21 205 lb 9.6 oz (93.3 kg)  01/09/21 213 lb 6.4 oz (96.8 kg)  11/15/20 214 lb 9.6 oz (97.3 kg)    Glycemic control:     Lab Results  Component Value Date   HGBA1C 7.6 (A) 07/24/2021   HGBA1C 7.5 (A) 01/09/2021   HGBA1C 7.8 (A) 09/13/2020   Lab Results  Component Value Date   MICROALBUR <0.7 09/13/2020   [place] 71 09/13/2020   CREATININE 0.97 09/13/2020       Allergies as of 07/24/2021       Reactions   Bisoprolol-hydrochlorothiazide Swelling, Other (See Comments)   Face and throat became swollen; wheezing also   Relafen [nabumetone] Hives, Shortness Of Breath   Aspirin Other (See Comments)   Irritates patient's IBS and Diverticulitis   Dulaglutide Other (See Comments)   Trulicity: Abdominal pain   Other Other (See Comments)   Seasonal allergies: Runny nose/eyes and congestion   Restasis [cyclosporine] Itching, Rash, Other (See Comments)   Eyes burn and rash develops around the eyes        Medication List        Accurate as of July 24, 2021  4:50 PM. If you have any questions, ask your nurse or doctor.          acetaminophen 650 MG CR tablet Commonly known as: TYLENOL Take 650-1,300 mg by mouth every 8 (eight) hours as needed (for pain or headaches).   albuterol 108 (90 Base) MCG/ACT inhaler Commonly known as: VENTOLIN HFA Inhale into the lungs.   ProAir HFA 108 (90 Base) MCG/ACT inhaler Generic drug: albuterol Inhale 2 puffs into the lungs every 6 (six) hours as needed.   ASPIRIN 81 PO Take 1 tablet by mouth daily.   atorvastatin 20 MG tablet Commonly known as: LIPITOR Take 1 tablet (20 mg total) by mouth daily.   BIOTIN PO biotin   butalbital-acetaminophen-caffeine 50-325-40 MG tablet Commonly known as:  FIORICET Take 1 tablet by mouth every 6 (six) hours as needed for headache. Do not refill in less than 30 days.   Farxiga 10 MG Tabs tablet Generic drug: dapagliflozin propanediol  TAKE 1 TABLET BY MOUTH EVERY DAY   FOLIC ACID PO Folic acid   FreeStyle Libre 3 Sensor Misc 1 Device by Does not apply route every 14 (fourteen) days. Apply 1 sensor on upper arm every 14 days for continuous glucose monitoring What changed: See the new instructions. Changed by: Elayne Snare, MD   gabapentin 600 MG tablet Commonly known as: NEURONTIN TAKE 1 TABLET 3 TIMES PER DAY AS NEEDED   insulin lispro 100 UNIT/ML KwikPen Commonly known as: HumaLOG KwikPen INJECT 8 UNITS SUBCUTANEOUSLY BEFORE MEALS   Insulin Pen Needle 32G X 4 MM Misc Use one per day to inject victoza   levothyroxine 25 MCG tablet Commonly known as: SYNTHROID Take 25 mcg by mouth daily before breakfast.   metFORMIN 500 MG 24 hr tablet Commonly known as: GLUCOPHAGE-XR TAKE 2 TABLETS BY MOUTH TWO TIMES A DAY   methocarbamol 500 MG tablet Commonly known as: ROBAXIN Take 1 tablet by mouth daily.   methotrexate 2.5 MG tablet Commonly known as: RHEUMATREX Take 2.5 mg by mouth once a week. TAKE 10 TABLETS BY MOUTH AT ONE TIME EVERY Monday.   nitroGLYCERIN 0.4 MG SL tablet Commonly known as: NITROSTAT Place 1 tablet (0.4 mg total) under the tongue every 5 (five) minutes as needed for chest pain.   OneTouch Delica Lancets 01U Misc Use OneTouch Delica Lancets to test blood sugars twice daily. DX: E11.49   OneTouch Verio Flex System w/Device Kit Use OneTouch Verio Flex to check blood sugar twice daily. DX: E11.49   OneTouch Verio test strip Generic drug: glucose blood Use OneTouch Verio test strips to test blood sugars twice daily. DX: E11.49   Ozempic (2 MG/DOSE) 8 MG/3ML Sopn Generic drug: Semaglutide (2 MG/DOSE) Inject 2 mg weekly   sertraline 100 MG tablet Commonly known as: ZOLOFT Take 200 mg by mouth daily. Take 2  tablets by mouth once daily.   Tyler Aas FlexTouch 100 UNIT/ML FlexTouch Pen Generic drug: insulin degludec INJECT 30 UNITS UNDER THE SKIN ONCE DAILY.        Allergies:  Allergies  Allergen Reactions   Bisoprolol-Hydrochlorothiazide Swelling and Other (See Comments)    Face and throat became swollen; wheezing also   Relafen [Nabumetone] Hives and Shortness Of Breath   Aspirin Other (See Comments)    Irritates patient's IBS and Diverticulitis   Dulaglutide Other (See Comments)    Trulicity: Abdominal pain   Other Other (See Comments)    Seasonal allergies: Runny nose/eyes and congestion   Restasis [Cyclosporine] Itching, Rash and Other (See Comments)    Eyes burn and rash develops around the eyes    Past Medical History:  Diagnosis Date   Colon polyps    Depression 05/10/2013   Diabetes mellitus without complication ([place])    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[place])    Diverticulosis    Headache    Hypertension    Irritable bowel    Kidney disease, chronic, stage II (GFR 60-89 ml/min)    Neuropathy    Neuropathy associated with endocrine disorder ([place])    Obesity (BMI 30-39.9)    Osteopenia    Visual disturbance     Past Surgical History:  Procedure Laterality Date   arm surgery Right    plantar faci      Family History  Problem Relation Age of Onset   Diabetes type II Mother    Stroke Mother    Hypertension Mother    Heart disease Father    Hypertension Father    Diabetes  Father    Heart attack Father    Heart disease Brother    Hypertension Brother    Diabetes Brother    Kidney disease Brother    Diabetes Sister    Asthma Maternal Grandmother    Hypertension Maternal Grandmother    Heart attack Maternal Grandmother    Heart attack Maternal Grandfather    Heart attack Paternal Grandmother    Diabetes Paternal Grandfather    Heart attack Paternal Grandfather    Diabetes Brother    Heart attack Brother    Diabetes Brother    Diabetes Brother    Other  Brother        Bright's Disease   Leukemia Other        brother's grandson   Breast cancer Neg Hx     Social History:  reports that she has never smoked. She has never used smokeless tobacco. She reports that she does not drink alcohol and does not use drugs.    Review of Systems    She is on methotrexate tablets from her ophthalmologist for inflammatory disease Her vision is improved       Lipids: These have been controlled with Lipitor 20 mg daily as of 11/2018 and recent LDL 73 done in 7/21 by PCP       Lab Results  Component Value Date   CHOL 162 09/13/2020   HDL 73.20 09/13/2020   LDLCALC 71 09/13/2020   TRIG 91.0 09/13/2020   CHOLHDL 2 09/13/2020                  The blood pressure has been high for several years treated by PCP  Currently not on any treatment, previously lisinopril stopped 7 because of low blood pressure Does take Farxiga  BP Readings from Last 3 Encounters:  07/24/21 118/82  01/09/21 122/82  11/15/20 130/86        She has a long history of Numbness, pain, tingling and burning in feet    Symptoms being treated with  gabapentin to 600 mg 3 times a day and she has usually good control of symptoms  Foot exam in 3/22 showed decreased monofilament sensation in the left toes   Her TSH baseline was 6.2, she has been given 25 mcg levothyroxine by her PCP Has fatigue  Lab Results  Component Value Date   TSH 1.92 07/24/2021   TSH 2.48 09/13/2020   TSH 2.32 03/28/2020   FREET4 0.96 09/13/2020   FREET4 0.79 03/28/2020     Physical Examination:  BP 118/82    Pulse 81    Ht 5' 6"  (1.676 m)    Wt 205 lb 9.6 oz (93.3 kg)    SpO2 98%    BMI 33.18 kg/m       ASSESSMENT:  Diabetes type 2, insulin requiring with obesity  She is on a regimen of TRESIBA 32 units daily, Humalog twice daily, Ozempic 2 weekly,   metformin 2 g and Farxiga 10 mg  See history of present illness for detailed discussion of current diabetes management, blood sugar  patterns and problems identified  Her A1c is about the same at 7.6 Her blood sugars at home especially recently appear to be higher than expected from her A1c with time in range only 53%  Her main issues are dietary compliance, inadequate Humalog at suppertime and needing more understanding on how to adjust her Humalog based on meal size and carbohydrates Recently not exercising either  FATIGUE: This may be post-COVID but  will need to recheck her thyroid levels to make sure she is not becoming increasingly hypothyroid   PLAN:   She feels that she can do much better with her diet overall Also discussed again the importance of controlling blood sugars after her evening meal She will need to calculate her Humalog before dinnertime on what she is eating and to control carbohydrates instructed to Premeal blood sugar She may need as much as 18 units for her evening meal if eating more carbohydrates Also can take supplemental doses if blood sugar either goes up too much or she has more snacks or carbohydrates after eating Discussed importance of taking the insulin before starting to eat We will go up to 34 of Tresiba instead of 30 She will also switch to Pink 3 instead of version 2 so that she can get much more complete picture of her blood sugars and her download information will be more complete She will try it to keep more regular follow-up Also she will try to keep up with her Ozempic consistently Consider Mounjaro if not getting control next time  Total visit time including counseling = 30 minutes  Patient Instructions  Tresiba 34 units Keep am sugar  Take 12-18 at supper based on meal type and sugar  Diet!      Elayne Snare 07/24/2021, 4:50 PM   Addendum: TSH normal  Note: This office note was prepared with Dragon voice recognition system technology. Any transcriptional errors that result from this process are unintentional.

## 2021-07-24 NOTE — Patient Instructions (Addendum)
Tresiba 34 units Keep am sugar  Take 12-18 at supper based on meal type and sugar  Diet!

## 2021-08-18 ENCOUNTER — Other Ambulatory Visit: Payer: Self-pay | Admitting: Endocrinology

## 2021-08-26 ENCOUNTER — Other Ambulatory Visit: Payer: Self-pay | Admitting: Endocrinology

## 2021-09-05 ENCOUNTER — Encounter: Payer: Self-pay | Admitting: Endocrinology

## 2021-09-21 ENCOUNTER — Other Ambulatory Visit: Payer: Self-pay | Admitting: Family Medicine

## 2021-09-21 DIAGNOSIS — Z1231 Encounter for screening mammogram for malignant neoplasm of breast: Secondary | ICD-10-CM

## 2021-09-22 ENCOUNTER — Other Ambulatory Visit: Payer: Self-pay | Admitting: Endocrinology

## 2021-09-25 ENCOUNTER — Ambulatory Visit (INDEPENDENT_AMBULATORY_CARE_PROVIDER_SITE_OTHER): Payer: 59 | Admitting: Endocrinology

## 2021-09-25 ENCOUNTER — Encounter: Payer: Self-pay | Admitting: Endocrinology

## 2021-09-25 ENCOUNTER — Other Ambulatory Visit: Payer: Self-pay

## 2021-09-25 VITALS — BP 118/82 | HR 74 | Ht 66.5 in | Wt 203.2 lb

## 2021-09-25 DIAGNOSIS — Z6833 Body mass index (BMI) 33.0-33.9, adult: Secondary | ICD-10-CM

## 2021-09-25 DIAGNOSIS — E1165 Type 2 diabetes mellitus with hyperglycemia: Secondary | ICD-10-CM

## 2021-09-25 DIAGNOSIS — Z794 Long term (current) use of insulin: Secondary | ICD-10-CM

## 2021-09-25 DIAGNOSIS — E7800 Pure hypercholesterolemia, unspecified: Secondary | ICD-10-CM | POA: Diagnosis not present

## 2021-09-25 NOTE — Patient Instructions (Signed)
Take 32 Tresiba and keep am sugar <130  ?

## 2021-09-25 NOTE — Progress Notes (Signed)
Patient ID: [name], female   DOB: 1956-03-04, 66 y.o.   MRN: [id number]           Reason for Appointment: Follow-up    Referring physician: Kathryne Eriksson  History of Present Illness:          Diagnosis: Type 2 diabetes mellitus, date of diagnosis:  2010       Past history:  She was having symptoms of neuropathy at onset At that time she was started on metformin alone, 500 mg twice a day and this was continued unchanged until recently Apparently she was also given Onglyza about 4 years ago but details of her control at that time is not available Her A1c levels have been mild increase over the last year but she thinks her blood sugars had been generally good with only some readings up to 200. A1c was 7.1 in 1/15 and she was continued on metformin and Onglyza When her A1c was 7.9 in August she was told to double her metformin to 2 g daily She  had blood sugars of 200 or more prior to her initial consultation  Her Trulicity was changed to Victoza because of diarrhea and abdominal pain and has been able to tolerate Victoza  1.8 hs  When A1c was 7.6 she was started on Invokana 100 mg in 11/16, this was subsequently increased when her A1c was 7.5 in 10/17 She has been on insulin since 12/2016  Recent history:   INSULIN regimen: Tresiba  30 units daily in am, Humalog usually 8  in am and 12 units at suppertime  Non-insulin hypoglycemic drugs the patient is taking are:  metformin ER 2 g daily, Farxiga 10 mg daily, Ozempic 2 mg weekly  Her A1c is last 7.6 compared to 7.5  Current blood sugar patterns and problems identified: She has generally improved blood sugar control Now getting 78% within target range compared to 53  She is overall benefited from improving her diet most of the time although still has some significantly higher sugars in the evenings when she goes off her diet  She did not increase her Tyler Aas as recommended because she thought she was getting low sugars overnight   However she still has average blood sugars in the 130-140 range overnight Overall is benefiting from using the new libre 3 sensor which allows her to monitor her sugar more closely, previously had only 40% active time on her sensor  She thinks she will feel better if her blood sugars are slightly higher than 140 compared to normal She has cut back on the Humalog at suppertime because of tendency to low sugars if she takes 12 units However not clear if she is adjusting the dose based on what she is eating and still has periodic readings over 250 after dinner or lunch No hypoglycemia however She has been able to maintain some weight loss She is trying to walk when she is able to get out with the better weather  she is trying to do better with cutting back on carbohydrates also  Mealtimes: Breakfast 10 AM, dinner usually at 5 pm  Side effects from medications have been: Diarrhea, abdominal pain with Trulicity  Interpretation of CGM download:  Overall blood sugars are better overnight but doing to be on an average high in the target range in the late afternoon and evenings with some fluctuation  CGM use % of time 97  2-week average/GV 156/26  Time in range    78    %  %  Time Above 180 19  % Time above 250 3  % Time Below 70 0     PRE-MEAL Fasting Lunch Dinner Bedtime Overall  Glucose range:       Averages: 133 151   156   POST-MEAL PC Breakfast PC Lunch PC Dinner  Glucose range:     Averages:  165 183   Previously:   CGM use % of time 39  2-week average/GV 195/33  Time in range      53  %  % Time Above 180 28+19  % Time above 250   % Time Below 70 0     PRE-MEAL Fasting Lunch Dinner Bedtime Overall  Glucose range:       Averages: 155 162 177  195   POST-MEAL PC Breakfast PC Lunch PC Dinner  Glucose range:     Averages:  187 261       Self-care: The diet that the patient has been following is: tries to limit carbohydrates .     Meals: 3 meals per day. Breakfast  is yogurt/ cereal/eggs with toast at 9-10 am; , lunch is a sandwich, and dinner at 5 pm will have baked chicken, starch and vegetables.  Has snacks with yogurt, cottage cheese, fruit, nuts and  rice cakes               Dietician visit, most recent: 9/15              Weight history:  Wt Readings from Last 3 Encounters:  09/25/21 203 lb 3.2 oz (92.2 kg)  07/24/21 205 lb 9.6 oz (93.3 kg)  01/09/21 213 lb 6.4 oz (96.8 kg)    Glycemic control:     Lab Results  Component Value Date   HGBA1C 7.6 (A) 07/24/2021   HGBA1C 7.5 (A) 01/09/2021   HGBA1C 7.8 (A) 09/13/2020   Lab Results  Component Value Date   MICROALBUR <0.7 09/13/2020   [place] 71 09/13/2020   CREATININE 0.97 09/13/2020       Allergies as of 09/25/2021       Reactions   Bisoprolol-hydrochlorothiazide Swelling, Other (See Comments)   Face and throat became swollen; wheezing also   Relafen [nabumetone] Hives, Shortness Of Breath   Aspirin Other (See Comments)   Irritates patient's IBS and Diverticulitis   Dulaglutide Other (See Comments)   Trulicity: Abdominal pain   Other Other (See Comments)   Seasonal allergies: Runny nose/eyes and congestion   Restasis [cyclosporine] Itching, Rash, Other (See Comments)   Eyes burn and rash develops around the eyes        Medication List        Accurate as of September 25, 2021  1:46 PM. If you have any questions, ask your nurse or doctor.          acetaminophen 650 MG CR tablet Commonly known as: TYLENOL Take 650-1,300 mg by mouth every 8 (eight) hours as needed (for pain or headaches).   albuterol 108 (90 Base) MCG/ACT inhaler Commonly known as: VENTOLIN HFA Inhale into the lungs.   ProAir HFA 108 (90 Base) MCG/ACT inhaler Generic drug: albuterol Inhale 2 puffs into the lungs every 6 (six) hours as needed.   ASPIRIN 81 PO Take 1 tablet by mouth daily.   atorvastatin 20 MG tablet Commonly known as: LIPITOR Take 1 tablet (20 mg total) by mouth daily.    BIOTIN PO biotin   butalbital-acetaminophen-caffeine 50-325-40 MG tablet Commonly known as: FIORICET Take 1 tablet by mouth  every 6 (six) hours as needed for headache. Do not refill in less than 30 days.   Farxiga 10 MG Tabs tablet Generic drug: dapagliflozin propanediol TAKE 1 TABLET BY MOUTH EVERY DAY   FOLIC ACID PO Folic acid   FreeStyle Libre 3 Sensor Misc 1 Device by Does not apply route every 14 (fourteen) days. Apply 1 sensor on upper arm every 14 days for continuous glucose monitoring   gabapentin 600 MG tablet Commonly known as: NEURONTIN TAKE 1 TABLET 3 TIMES PER DAY AS NEEDED   insulin lispro 100 UNIT/ML KwikPen Commonly known as: HumaLOG KwikPen INJECT 8 UNITS SUBCUTANEOUSLY BEFORE MEALS   Insulin Pen Needle 32G X 4 MM Misc Use one per day to inject victoza   levothyroxine 25 MCG tablet Commonly known as: SYNTHROID Take 25 mcg by mouth daily before breakfast.   metFORMIN 500 MG 24 hr tablet Commonly known as: GLUCOPHAGE-XR TAKE 2 TABLETS BY MOUTH TWO TIMES A DAY   methocarbamol 500 MG tablet Commonly known as: ROBAXIN Take 1 tablet by mouth daily.   methotrexate 2.5 MG tablet Commonly known as: RHEUMATREX Take 2.5 mg by mouth once a week. TAKE 10 TABLETS BY MOUTH AT ONE TIME EVERY Monday.   nitroGLYCERIN 0.4 MG SL tablet Commonly known as: NITROSTAT Place 1 tablet (0.4 mg total) under the tongue every 5 (five) minutes as needed for chest pain.   OneTouch Delica Lancets 61Q Misc Use OneTouch Delica Lancets to test blood sugars twice daily. DX: E11.49   OneTouch Verio Flex System w/Device Kit Use OneTouch Verio Flex to check blood sugar twice daily. DX: E11.49   OneTouch Verio test strip Generic drug: glucose blood Use OneTouch Verio test strips to test blood sugars twice daily. DX: E11.49   Ozempic (2 MG/DOSE) 8 MG/3ML Sopn Generic drug: Semaglutide (2 MG/DOSE) INJECT 2 MG ONCE WEEKLY   sertraline 100 MG tablet Commonly known as:  ZOLOFT Take 200 mg by mouth daily. Take 2 tablets by mouth once daily.   Tyler Aas FlexTouch 100 UNIT/ML FlexTouch Pen Generic drug: insulin degludec INJECT 30 UNITS UNDER THE SKIN ONCE DAILY.        Allergies:  Allergies  Allergen Reactions   Bisoprolol-Hydrochlorothiazide Swelling and Other (See Comments)    Face and throat became swollen; wheezing also   Relafen [Nabumetone] Hives and Shortness Of Breath   Aspirin Other (See Comments)    Irritates patient's IBS and Diverticulitis   Dulaglutide Other (See Comments)    Trulicity: Abdominal pain   Other Other (See Comments)    Seasonal allergies: Runny nose/eyes and congestion   Restasis [Cyclosporine] Itching, Rash and Other (See Comments)    Eyes burn and rash develops around the eyes    Past Medical History:  Diagnosis Date   Colon polyps    Depression 05/10/2013   Diabetes mellitus without complication ([place])    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[place])    Diverticulosis    Headache    Hypertension    Irritable bowel    Kidney disease, chronic, stage II (GFR 60-89 ml/min)    Neuropathy    Neuropathy associated with endocrine disorder ([place])    Obesity (BMI 30-39.9)    Osteopenia    Visual disturbance     Past Surgical History:  Procedure Laterality Date   arm surgery Right    plantar faci      Family History  Problem Relation Age of Onset   Diabetes type II Mother    Stroke Mother  Hypertension Mother    Heart disease Father    Hypertension Father    Diabetes Father    Heart attack Father    Heart disease Brother    Hypertension Brother    Diabetes Brother    Kidney disease Brother    Diabetes Sister    Asthma Maternal Grandmother    Hypertension Maternal Grandmother    Heart attack Maternal Grandmother    Heart attack Maternal Grandfather    Heart attack Paternal Grandmother    Diabetes Paternal Grandfather    Heart attack Paternal Grandfather    Diabetes Brother    Heart attack Brother    Diabetes  Brother    Diabetes Brother    Other Brother        Bright's Disease   Leukemia Other        brother's grandson   Breast cancer Neg Hx     Social History:  reports that she has never smoked. She has never used smokeless tobacco. She reports that she does not drink alcohol and does not use drugs.    Review of Systems    She is on methotrexate tablets from her ophthalmologist for inflammatory disease Her vision is improved       Lipids: These have been controlled with Lipitor 20 mg daily as of 11/2018 She normally has lipids checked every July with her PCP but no recent labs available      Lab Results  Component Value Date   CHOL 162 09/13/2020   HDL 73.20 09/13/2020   LDLCALC 71 09/13/2020   TRIG 91.0 09/13/2020   CHOLHDL 2 09/13/2020                  The blood pressure previously was treated with medications  Again now not on any treatment, previously lisinopril stopped  because of low blood pressure Does take Farxiga  BP Readings from Last 3 Encounters:  09/25/21 118/82  07/24/21 118/82  01/09/21 122/82        She has a long history of Numbness, pain, tingling and burning in feet    Symptoms being treated with  gabapentin to 600 mg 3 times a day   Foot exam in 3/22 showed decreased monofilament sensation in the left toes   Her TSH baseline was 6.2, she has been given 25 mcg levothyroxine by her PCP No unusual fatigue recently  Lab Results  Component Value Date   TSH 1.92 07/24/2021   TSH 2.48 09/13/2020   TSH 2.32 03/28/2020   FREET4 0.96 09/13/2020   FREET4 0.79 03/28/2020     Physical Examination:  BP 118/82    Pulse 74    Ht 5' 6.5" (1.689 m)    Wt 203 lb 3.2 oz (92.2 kg)    SpO2 97%    BMI 32.31 kg/m       ASSESSMENT:  Diabetes type 2, insulin requiring with obesity  She is on a regimen of TRESIBA 30 units daily, Humalog twice daily, Ozempic 2 weekly,   metformin 2 g and Farxiga 10 mg  See history of present illness for detailed discussion  of current diabetes management, blood sugar patterns and problems identified  Her A1c is last about the same at 7.6  Recent freestyle libre sensor download indicates fairly adequate control with GMI 7% Except for the last week or so she still has periodic high postprandial readings at various times in the afternoons or evenings and also some overnight Fasting readings are mostly well controlled  with still overall higher than target Her exercise regimen has been intermittent but has been able to maintain her weight at least with Ozempic  Blood pressure still normal without medication  HYPERCHOLESTEROLEMIA: Does need follow-up labs, currently Lipitor is being prescribed by her PCP   PLAN:   Tresiba 32 units and try to keep morning sugars below 130 average, needs to check her sugar when she wakes up consistently Start adjusting Humalog at suppertime based on portions and carbohydrates and likely needs between 8 to 14 units depending on her meal size with 10 units for usual average meals and larger doses if she is getting more carbohydrates or sweets Make sure she takes her Humalog before starting to eat Discussed postprandial targets of at least under 180 More regular walking for exercise Again may consider switching to [place] if she has difficulty getting consistent control or weight loss  Total visit time including counseling = 30 minutes  There are no Patient Instructions on file for this visit.      Elayne Snare 09/25/2021, 1:46 PM     Note: This office note was prepared with Dragon voice recognition system technology. Any transcriptional errors that result from this process are unintentional.

## 2021-09-27 ENCOUNTER — Ambulatory Visit
Admission: RE | Admit: 2021-09-27 | Discharge: 2021-09-27 | Disposition: A | Discharge status: Home / Self Care | Payer: 59 | Source: Ambulatory Visit | Attending: Family Medicine | Admitting: Family Medicine

## 2021-09-27 DIAGNOSIS — Z1231 Encounter for screening mammogram for malignant neoplasm of breast: Secondary | ICD-10-CM

## 2021-10-14 ENCOUNTER — Other Ambulatory Visit: Payer: Self-pay | Admitting: Endocrinology

## 2021-10-14 DIAGNOSIS — E1165 Type 2 diabetes mellitus with hyperglycemia: Secondary | ICD-10-CM

## 2021-10-17 ENCOUNTER — Other Ambulatory Visit: Payer: Self-pay | Admitting: Endocrinology

## 2021-11-18 ENCOUNTER — Other Ambulatory Visit: Payer: Self-pay | Admitting: Endocrinology

## 2021-12-25 ENCOUNTER — Ambulatory Visit (INDEPENDENT_AMBULATORY_CARE_PROVIDER_SITE_OTHER): Payer: 59 | Admitting: Endocrinology

## 2021-12-25 ENCOUNTER — Encounter: Payer: Self-pay | Admitting: Endocrinology

## 2021-12-25 VITALS — BP 132/84 | HR 60 | Ht 66.5 in | Wt 207.8 lb

## 2021-12-25 DIAGNOSIS — E1165 Type 2 diabetes mellitus with hyperglycemia: Secondary | ICD-10-CM

## 2021-12-25 DIAGNOSIS — Z794 Long term (current) use of insulin: Secondary | ICD-10-CM | POA: Diagnosis not present

## 2021-12-25 DIAGNOSIS — I10 Essential (primary) hypertension: Secondary | ICD-10-CM | POA: Diagnosis not present

## 2021-12-25 LAB — BASIC METABOLIC PANEL
BUN: 14 mg/dL (ref 6–23)
CO2: 26 mEq/L (ref 19–32)
Calcium: 9.4 mg/dL (ref 8.4–10.5)
Chloride: 105 mEq/L (ref 96–112)
Creatinine, Ser: 0.82 mg/dL (ref 0.40–1.20)
GFR: 75.32 mL/min (ref >60.00)
Glucose, Bld: 140 mg/dL — ABNORMAL HIGH (ref 70–99)
Potassium: 4 mEq/L (ref 3.5–5.1)
Sodium: 138 mEq/L (ref 135–145)

## 2021-12-25 LAB — LDL CHOLESTEROL, DIRECT: Direct LDL: 58 mg/dL

## 2021-12-25 LAB — MICROALBUMIN / CREATININE URINE RATIO
Creatinine,U: 34.1 mg/dL
Microalb Creat Ratio: 2.1 mg/g (ref 0.0–30.0)
Microalb, Ur: <0.7 mg/dL (ref 0.0–1.9)

## 2021-12-25 LAB — POCT GLYCOSYLATED HEMOGLOBIN (HGB A1C): Hemoglobin A1C: 7.1 % — AB (ref 4.0–5.6)

## 2021-12-25 NOTE — Progress Notes (Signed)
Patient ID: [name], female   DOB: Jun 21, 1956, 66 y.o.   MRN: [id number]           Reason for Appointment: Follow-up    Referring physician: Kathryne Eriksson  History of Present Illness:          Diagnosis: Type 2 diabetes mellitus, date of diagnosis:  2010       Past history:  She was having symptoms of neuropathy at onset At that time she was started on metformin alone, 500 mg twice a day and this was continued unchanged until recently Apparently she was also given Onglyza about 4 years ago but details of her control at that time is not available Her A1c levels have been mild increase over the last year but she thinks her blood sugars had been generally good with only some readings up to 200. A1c was 7.1 in 1/15 and she was continued on metformin and Onglyza When her A1c was 7.9 in August she was told to double her metformin to 2 g daily She  had blood sugars of 200 or more prior to her initial consultation  Her Trulicity was changed to Victoza because of diarrhea and abdominal pain and has been able to tolerate Victoza  1.8 hs  When A1c was 7.6 she was started on Invokana 100 mg in 11/16, this was subsequently increased when her A1c was 7.5 in 10/17 She has been on insulin since 12/2016  Recent history:   INSULIN regimen: Tresiba  32 units daily in am, Humalog usually 8  in am and 12 units at suppertime  Non-insulin hypoglycemic drugs the patient is taking are:  metformin ER 2 g daily, Farxiga 10 mg daily, Ozempic 2 mg weekly  Her A1c is 7.1 and improved  Current blood sugar patterns and problems identified: She has variable blood sugars recently  Although her A1c is better recently she is getting 30% of her readings above target  This is all related to postprandial hyperglycemia from eating larger portions, some eating out, possibly late insulin doses and inadequate coverage at times  Although she is taking as much as 12 units Humalog at dinnertime she likely does not take  enough for what she is eating especially with larger amounts of carbohydrates or desserts Previously her time in range was up to 78% She has taken her Ozempic regularly without side effects Also likely not controlling her calorie intake as she has gained 4 pounds This is despite trying to walk 3 days a week Likely had a low blood sugar episode once from taking her evening insulin late when the blood sugar is gone up  Mealtimes: Breakfast 10 AM, dinner usually at 5 pm  Side effects from medications have been: Diarrhea, abdominal pain with Trulicity  Interpretation of CGM download:  Generally her average blood sugars are fairly good but higher above 180 after about 5 PM with significant variability  Hyperglycemia is seen periodically with spikes over 300 anytime between 3 PM-1 AM  blood sugars are better overnight but doing to be on an average high in the target range in the late afternoon and evenings with some fluctuation Postprandial readings rising over 60 mg from baseline on an average she has blood sugar spikes up to 350 as above Hypoglycemia occurred only once a 3 AM preceded by high readings Overnight blood sugars are variable with sometimes higher readings after midnight but usually fairly level in the morning hours until breakfast  CGM use % of time 84  2-week average/GV 161  Time in range       70 %  % Time Above 180 21  % Time above 250 9  % Time Below 70      PRE-MEAL Fasting Lunch Dinner Bedtime Overall  Glucose range:       Averages: 124  160 200    POST-MEAL PC Breakfast PC Lunch PC Dinner  Glucose range:     Averages:  150 209   Previously  CGM use % of time 97  2-week average/GV 156/26  Time in range    78    %  % Time Above 180 19  % Time above 250 3  % Time Below 70 0     PRE-MEAL Fasting Lunch Dinner Bedtime Overall  Glucose range:       Averages: 133 151   156   POST-MEAL PC Breakfast PC Lunch PC Dinner  Glucose range:     Averages:  165 183       Self-care: The diet that the patient has been following is: tries to limit carbohydrates .     Meals: 3 meals per day. Breakfast is yogurt/ cereal/eggs with toast at 9-10 am; , lunch is a sandwich, and dinner at 5 pm will have baked chicken, starch and vegetables.  Has snacks with yogurt, cottage cheese, fruit, nuts and  rice cakes               Dietician visit, most recent: 9/15              Weight history:  Wt Readings from Last 3 Encounters:  12/25/21 207 lb 12.8 oz (94.3 kg)  09/25/21 203 lb 3.2 oz (92.2 kg)  07/24/21 205 lb 9.6 oz (93.3 kg)    Glycemic control:     Lab Results  Component Value Date   HGBA1C 7.1 (A) 12/25/2021   HGBA1C 7.6 (A) 07/24/2021   HGBA1C 7.5 (A) 01/09/2021   Lab Results  Component Value Date   MICROALBUR <0.7 09/13/2020   LDLCALC 71 09/13/2020   CREATININE 0.97 09/13/2020   0.77  10/24/21    Allergies as of 12/25/2021       Reactions   Bisoprolol-hydrochlorothiazide Swelling, Other (See Comments)   Face and throat became swollen; wheezing also   Relafen [nabumetone] Hives, Shortness Of Breath   Aspirin Other (See Comments)   Irritates patient's IBS and Diverticulitis   Dulaglutide Other (See Comments)   Trulicity: Abdominal pain   Other Other (See Comments)   Seasonal allergies: Runny nose/eyes and congestion   Restasis [cyclosporine] Itching, Rash, Other (See Comments)   Eyes burn and rash develops around the eyes        Medication List        Accurate as of December 25, 2021  4:27 PM. If you have any questions, ask your nurse or doctor.          acetaminophen 650 MG CR tablet Commonly known as: TYLENOL Take 650-1,300 mg by mouth every 8 (eight) hours as needed (for pain or headaches).   albuterol 108 (90 Base) MCG/ACT inhaler Commonly known as: VENTOLIN HFA Inhale into the lungs.   ProAir HFA 108 (90 Base) MCG/ACT inhaler Generic drug: albuterol Inhale 2 puffs into the lungs every 6 (six) hours as needed.    ASPIRIN 81 PO Take 1 tablet by mouth daily.   atorvastatin 20 MG tablet Commonly known as: LIPITOR Take 1 tablet (20 mg total) by mouth daily.   BIOTIN  PO biotin   butalbital-acetaminophen-caffeine 50-325-40 MG tablet Commonly known as: FIORICET Take 1 tablet by mouth every 6 (six) hours as needed for headache. Do not refill in less than 30 days.   Farxiga 10 MG Tabs tablet Generic drug: dapagliflozin propanediol TAKE 1 TABLET BY MOUTH EVERY DAY   FOLIC ACID PO Folic acid   FreeStyle Libre 3 Sensor Misc APPLY 1 SENSOR ON UPPER ARM EVERY 14 DAYS FOR CONTINUOUS GLUCOSE MONITORING   gabapentin 600 MG tablet Commonly known as: NEURONTIN TAKE 1 TABLET 3 TIMES PER DAY AS NEEDED   insulin lispro 100 UNIT/ML KwikPen Commonly known as: HumaLOG KwikPen INJECT 8 UNITS SUBCUTANEOUSLY BEFORE MEALS   Insulin Pen Needle 32G X 4 MM Misc Use one per day to inject victoza   levothyroxine 25 MCG tablet Commonly known as: SYNTHROID Take 25 mcg by mouth daily before breakfast.   metFORMIN 500 MG 24 hr tablet Commonly known as: GLUCOPHAGE-XR TAKE 2 TABLETS BY MOUTH TWICE A DAY   methocarbamol 500 MG tablet Commonly known as: ROBAXIN Take 1 tablet by mouth daily.   methotrexate 2.5 MG tablet Commonly known as: RHEUMATREX Take 2.5 mg by mouth once a week. TAKE 10 TABLETS BY MOUTH AT ONE TIME EVERY Monday.   nitroGLYCERIN 0.4 MG SL tablet Commonly known as: NITROSTAT Place 1 tablet (0.4 mg total) under the tongue every 5 (five) minutes as needed for chest pain.   OneTouch Delica Lancets 93Z Misc Use OneTouch Delica Lancets to test blood sugars twice daily. DX: E11.49   OneTouch Verio Flex System w/Device Kit Use OneTouch Verio Flex to check blood sugar twice daily. DX: E11.49   OneTouch Verio test strip Generic drug: glucose blood Use OneTouch Verio test strips to test blood sugars twice daily. DX: E11.49   Ozempic (2 MG/DOSE) 8 MG/3ML Sopn Generic drug: Semaglutide (2  MG/DOSE) INJECT 2 MG ONCE WEEKLY   sertraline 100 MG tablet Commonly known as: ZOLOFT Take 200 mg by mouth daily. Take 2 tablets by mouth once daily.   Tyler Aas FlexTouch 100 UNIT/ML FlexTouch Pen Generic drug: insulin degludec INJECT 30 UNITS UNDER THE SKIN ONCE DAILY.        Allergies:  Allergies  Allergen Reactions   Bisoprolol-Hydrochlorothiazide Swelling and Other (See Comments)    Face and throat became swollen; wheezing also   Relafen [Nabumetone] Hives and Shortness Of Breath   Aspirin Other (See Comments)    Irritates patient's IBS and Diverticulitis   Dulaglutide Other (See Comments)    Trulicity: Abdominal pain   Other Other (See Comments)    Seasonal allergies: Runny nose/eyes and congestion   Restasis [Cyclosporine] Itching, Rash and Other (See Comments)    Eyes burn and rash develops around the eyes    Past Medical History:  Diagnosis Date   Colon polyps    Depression 05/10/2013   Diabetes mellitus without complication ([place])    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[place])    Diverticulosis    Headache    Hypertension    Irritable bowel    Kidney disease, chronic, stage II (GFR 60-89 ml/min)    Neuropathy    Neuropathy associated with endocrine disorder ([place])    Obesity (BMI 30-39.9)    Osteopenia    Visual disturbance     Past Surgical History:  Procedure Laterality Date   arm surgery Right    plantar faci      Family History  Problem Relation Age of Onset   Diabetes type II Mother  Stroke Mother    Hypertension Mother    Heart disease Father    Hypertension Father    Diabetes Father    Heart attack Father    Heart disease Brother    Hypertension Brother    Diabetes Brother    Kidney disease Brother    Diabetes Sister    Asthma Maternal Grandmother    Hypertension Maternal Grandmother    Heart attack Maternal Grandmother    Heart attack Maternal Grandfather    Heart attack Paternal Grandmother    Diabetes Paternal Grandfather    Heart  attack Paternal Grandfather    Diabetes Brother    Heart attack Brother    Diabetes Brother    Diabetes Brother    Other Brother        Bright's Disease   Leukemia Other        brother's grandson   Breast cancer Neg Hx     Social History:  reports that she has never smoked. She has never used smokeless tobacco. She reports that she does not drink alcohol and does not use drugs.    Review of Systems    She is on methotrexate tablets from her ophthalmologist for inflammatory disease Her vision is improved       Lipids: These have been controlled with Lipitor 20 mg daily as of 11/2018 She normally has lipids checked every July with her PCP but no recent labs available      Lab Results  Component Value Date   CHOL 162 09/13/2020   HDL 73.20 09/13/2020   LDLCALC 71 09/13/2020   TRIG 91.0 09/13/2020   CHOLHDL 2 09/13/2020                  The blood pressure history as follows  Blood pressure usually controlled without any treatment, previously lisinopril stopped  because of low blood pressure Does take [place] Although her blood pressure was higher on the first measurement it was better the second time and she is now getting high readings above 130 at home  BP Readings from Last 3 Encounters:  12/25/21 132/84  09/25/21 118/82  07/24/21 118/82        She has a long history of Numbness, pain, tingling and burning in feet    Symptoms being treated with  gabapentin to 600 mg 3 times a day   Foot exam in 3/22 showed decreased monofilament sensation in the left toes   Her TSH baseline was 6.2, she has been given 25 mcg levothyroxine by her PCP Overall feels fairly good  Lab Results  Component Value Date   TSH 1.92 07/24/2021   TSH 2.48 09/13/2020   TSH 2.32 03/28/2020   FREET4 0.96 09/13/2020   FREET4 0.79 03/28/2020     Physical Examination:  BP 132/84   Pulse 60   Ht 5' 6.5" (1.689 m)   Wt 207 lb 12.8 oz (94.3 kg)   SpO2 96%   BMI 33.04 kg/m       ASSESSMENT:  Diabetes type 2, insulin requiring with obesity  She is on a regimen of TRESIBA 32 units daily, Humalog twice daily, Ozempic 2 weekly,   metformin 2 g and Farxiga 10 mg  See history of present illness for detailed discussion of current diabetes management, blood sugar patterns and problems identified  Her A1c is improved at 7.1  Recent freestyle libre sensor download indicates inconsistent postprandial control  History of hypertension: Although blood pressure is high normal today she reports  better readings at home and will not restart the lisinopril as yet  HYPERCHOLESTEROLEMIA: Will need follow-up labs today   PLAN:   No change in [place] She needs to be consistent with diet with cutting back on carbohydrates and portions May take as much as 16 units of Humalog if eating larger amounts of carbohydrate or sweets in the evening Do not take extra insulin if more than 30 minutes from the start of the meal but may take supplemental doses if blood sugar goes up excessively right after eating More frequent walking for exercise Consider Mounjaro in the future  If her blood pressure is getting consistently high will need to get back on lisinopril or ARB drug  Total visit time including counseling = 30 minutes  Patient Instructions  May take upto 16 Humalog but within 30 min of meal      Cosby Proby 12/25/2021, 4:27 PM     Note: This office note was prepared with Dragon voice recognition system technology. Any transcriptional errors that result from this process are unintentional.

## 2021-12-25 NOTE — Patient Instructions (Signed)
May take upto 16 Humalog but within 30 min of meal

## 2021-12-27 ENCOUNTER — Encounter: Payer: Self-pay | Admitting: Endocrinology

## 2022-01-18 ENCOUNTER — Other Ambulatory Visit: Payer: Self-pay | Admitting: Endocrinology

## 2022-01-29 ENCOUNTER — Other Ambulatory Visit: Payer: Self-pay | Admitting: Endocrinology

## 2022-02-14 ENCOUNTER — Other Ambulatory Visit: Payer: Self-pay | Admitting: Endocrinology

## 2022-02-14 DIAGNOSIS — E1165 Type 2 diabetes mellitus with hyperglycemia: Secondary | ICD-10-CM

## 2022-03-15 ENCOUNTER — Other Ambulatory Visit: Payer: Self-pay | Admitting: Endocrinology

## 2022-04-17 ENCOUNTER — Other Ambulatory Visit: Payer: Self-pay | Admitting: Endocrinology

## 2022-04-17 DIAGNOSIS — E1165 Type 2 diabetes mellitus with hyperglycemia: Secondary | ICD-10-CM

## 2022-04-23 ENCOUNTER — Other Ambulatory Visit: Payer: Self-pay | Admitting: Endocrinology

## 2022-05-03 ENCOUNTER — Encounter: Payer: Self-pay | Admitting: Endocrinology

## 2022-05-03 ENCOUNTER — Ambulatory Visit (INDEPENDENT_AMBULATORY_CARE_PROVIDER_SITE_OTHER): Payer: 59 | Admitting: Endocrinology

## 2022-05-03 VITALS — BP 122/80 | HR 75 | Ht 66.5 in | Wt 205.8 lb

## 2022-05-03 DIAGNOSIS — Z794 Long term (current) use of insulin: Secondary | ICD-10-CM

## 2022-05-03 DIAGNOSIS — E1142 Type 2 diabetes mellitus with diabetic polyneuropathy: Secondary | ICD-10-CM | POA: Diagnosis not present

## 2022-05-03 DIAGNOSIS — E1165 Type 2 diabetes mellitus with hyperglycemia: Secondary | ICD-10-CM

## 2022-05-03 DIAGNOSIS — Z23 Encounter for immunization: Secondary | ICD-10-CM | POA: Diagnosis not present

## 2022-05-03 DIAGNOSIS — E038 Other specified hypothyroidism: Secondary | ICD-10-CM | POA: Diagnosis not present

## 2022-05-03 LAB — POCT GLYCOSYLATED HEMOGLOBIN (HGB A1C): Hemoglobin A1C: 7.3 % — AB (ref 4.0–5.6)

## 2022-05-03 NOTE — Progress Notes (Signed)
Patient ID: [name], female   DOB: 04-09-1956, 66 y.o.   MRN: [id number]           Reason for Appointment: Follow-up    Referring physician: Kathryne Eriksson  History of Present Illness:          Diagnosis: Type 2 diabetes mellitus, date of diagnosis:  2010       Past history:  She was having symptoms of neuropathy at onset At that time she was started on metformin alone, 500 mg twice a day and this was continued unchanged until recently Apparently she was also given Onglyza about 4 years ago but details of her control at that time is not available Her A1c levels have been mild increase over the last year but she thinks her blood sugars had been generally good with only some readings up to 200. A1c was 7.1 in 1/15 and she was continued on metformin and Onglyza When her A1c was 7.9 in August she was told to double her metformin to 2 g daily She  had blood sugars of 200 or more prior to her initial consultation  Her Trulicity was changed to Victoza because of diarrhea and abdominal pain and has been able to tolerate Victoza  1.8 hs  When A1c was 7.6 she was started on Invokana 100 mg in 11/16, this was subsequently increased when her A1c was 7.5 in 10/17 She has been on insulin since 12/2016  Recent history:   INSULIN regimen: Tresiba  32 units daily in am, Humalog usually 8  in am and 12 units at suppertime  Non-insulin hypoglycemic drugs the patient is taking are:  metformin ER 1 g daily, Farxiga 10 mg daily, Ozempic 2 mg weekly  Her A1c is 7.3 and increased  Current blood sugar patterns and problems identified: She has variable blood sugars and generally higher than before Although her A1c is not significantly higher she only has 48% within target Blood sugars are mostly high postprandially She thinks this is from stress and overeating as well as sometimes eating dessert She does not take extra insulin for larger carbohydrate meals or desserts like cakes She says her mother  makes her eat cakes or other desserts in the evenings at times Also does not cover any snacks late at night Most of her postprandial readings are over 200 And fasting readings are variable She has taken her Ozempic regularly without side effects Currently not walking  Mealtimes: Breakfast 10 AM, dinner usually at 5 pm  Side effects from medications have been: Diarrhea, abdominal pain with Trulicity  Interpretation of CGM download for the libre 3: Last 2 weeks:  Over the 24 hours her average blood sugars within the target range only in the morning hours and otherwise excessively high especially in the evenings Also blood sugars are not consistent in the afternoons and evenings Overnight blood sugars start off averaging nearly 225 and only start improving by about 5 AM with fasting average about 144 RECENTLY her morning readings have been at times low normal but otherwise generally not consistently controlled However no hypoglycemia except rarely around 5 AM POSTPRANDIAL readings show significant blood sugar spikes either late afternoon, sometimes at lunch but mostly recently after dinner with blood sugars going up occasionally over 350 Premeal blood sugars are also high before lunch and dinner    CGM use % of time 77  2-week average/GV 197/38.5  Time in range  48      %  % Time Above  180 26  % Time above 250 25  % Time Below 70 1      PRE-MEAL Fasting Lunch Dinner Bedtime Overall  Glucose range:       Averages:        POST-MEAL PC Breakfast PC Lunch PC Dinner  Glucose range:     Averages: 179   210 247   Prior   CGM use % of time 84  2-week average/GV 161  Time in range       70 %  % Time Above 180 21  % Time above 250 9  % Time Below 70      PRE-MEAL Fasting Lunch Dinner Bedtime Overall  Glucose range:       Averages: 124  160 200    POST-MEAL PC Breakfast PC Lunch PC Dinner  Glucose range:     Averages:  150 209        Self-care: The diet that the  patient has been following is: tries to limit carbohydrates .     Meals: 3 meals per day. Breakfast is yogurt/ cereal/eggs with toast at 9-10 am; , lunch is a sandwich, and dinner at 5 pm will have baked chicken, starch and vegetables.  Has snacks with yogurt, cottage cheese, fruit, nuts and  rice cakes               Dietician visit, most recent: 9/15              Weight history:  Wt Readings from Last 3 Encounters:  05/03/22 205 lb 12.8 oz (93.4 kg)  12/25/21 207 lb 12.8 oz (94.3 kg)  09/25/21 203 lb 3.2 oz (92.2 kg)    Glycemic control:     Lab Results  Component Value Date   HGBA1C 7.3 (A) 05/03/2022   HGBA1C 7.1 (A) 12/25/2021   HGBA1C 7.6 (A) 07/24/2021   Lab Results  Component Value Date   MICROALBUR <0.7 12/25/2021   LDLCALC 71 09/13/2020   CREATININE 0.82 12/25/2021   0.77  10/24/21    Allergies as of 05/03/2022       Reactions   Bisoprolol-hydrochlorothiazide Swelling, Other (See Comments)   Face and throat became swollen; wheezing also   Relafen [nabumetone] Hives, Shortness Of Breath   Aspirin Other (See Comments)   Irritates patient's IBS and Diverticulitis   Dulaglutide Other (See Comments)   Trulicity: Abdominal pain   Other Other (See Comments)   Seasonal allergies: Runny nose/eyes and congestion   Restasis [cyclosporine] Itching, Rash, Other (See Comments)   Eyes burn and rash develops around the eyes        Medication List        Accurate as of May 03, 2022  4:57 PM. If you have any questions, ask your nurse or doctor.          acetaminophen 650 MG CR tablet Commonly known as: TYLENOL Take 650-1,300 mg by mouth every 8 (eight) hours as needed (for pain or headaches).   albuterol 108 (90 Base) MCG/ACT inhaler Commonly known as: VENTOLIN HFA Inhale into the lungs.   ProAir HFA 108 (90 Base) MCG/ACT inhaler Generic drug: albuterol Inhale 2 puffs into the lungs every 6 (six) hours as needed.   ASPIRIN 81 PO Take 1 tablet by mouth  daily.   atorvastatin 20 MG tablet Commonly known as: LIPITOR Take 1 tablet (20 mg total) by mouth daily.   BIOTIN PO biotin   butalbital-acetaminophen-caffeine 50-325-40 MG tablet Commonly known as: FIORICET Take  1 tablet by mouth every 6 (six) hours as needed for headache. Do not refill in less than 30 days.   Farxiga 10 MG Tabs tablet Generic drug: dapagliflozin propanediol TAKE 1 TABLET BY MOUTH EVERY DAY   FOLIC ACID PO Folic acid   FreeStyle Libre 3 Sensor Misc APPLY 1 SENSOR ON UPPER ARM EVERY 14 DAYS FOR CONTINUOUS GLUCOSE MONITORING   gabapentin 600 MG tablet Commonly known as: NEURONTIN TAKE 1 TABLET 3 TIMES PER DAY AS NEEDED   insulin lispro 100 UNIT/ML KwikPen Commonly known as: HumaLOG KwikPen INJECT 8 UNITS SUBCUTANEOUSLY BEFORE MEALS   Insulin Pen Needle 32G X 4 MM Misc Use one per day to inject victoza   levothyroxine 25 MCG tablet Commonly known as: SYNTHROID Take 25 mcg by mouth daily before breakfast.   metFORMIN 500 MG 24 hr tablet Commonly known as: GLUCOPHAGE-XR TAKE 2 TABLETS BY MOUTH TWICE A DAY   methocarbamol 500 MG tablet Commonly known as: ROBAXIN Take 1 tablet by mouth daily.   methotrexate 2.5 MG tablet Commonly known as: RHEUMATREX Take 2.5 mg by mouth once a week. TAKE 10 TABLETS BY MOUTH AT ONE TIME EVERY Monday.   nitroGLYCERIN 0.4 MG SL tablet Commonly known as: NITROSTAT Place 1 tablet (0.4 mg total) under the tongue every 5 (five) minutes as needed for chest pain.   OneTouch Delica Lancets 51Z Misc Use OneTouch Delica Lancets to test blood sugars twice daily. DX: E11.49   OneTouch Verio Flex System w/Device Kit Use OneTouch Verio Flex to check blood sugar twice daily. DX: E11.49   OneTouch Verio test strip Generic drug: glucose blood Use OneTouch Verio test strips to test blood sugars twice daily. DX: E11.49   Ozempic (2 MG/DOSE) 8 MG/3ML Sopn Generic drug: Semaglutide (2 MG/DOSE) INJECT 2 MG ONCE WEEKLY    sertraline 100 MG tablet Commonly known as: ZOLOFT Take 200 mg by mouth daily. Take 2 tablets by mouth once daily.   Tyler Aas FlexTouch 100 UNIT/ML FlexTouch Pen Generic drug: insulin degludec INJECT 30 UNITS UNDER THE SKIN ONCE DAILY.        Allergies:  Allergies  Allergen Reactions   Bisoprolol-Hydrochlorothiazide Swelling and Other (See Comments)    Face and throat became swollen; wheezing also   Relafen [Nabumetone] Hives and Shortness Of Breath   Aspirin Other (See Comments)    Irritates patient's IBS and Diverticulitis   Dulaglutide Other (See Comments)    Trulicity: Abdominal pain   Other Other (See Comments)    Seasonal allergies: Runny nose/eyes and congestion   Restasis [Cyclosporine] Itching, Rash and Other (See Comments)    Eyes burn and rash develops around the eyes    Past Medical History:  Diagnosis Date   Colon polyps    Depression 05/10/2013   Diabetes mellitus without complication ([place])    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[place])    Diverticulosis    Headache    Hypertension    Irritable bowel    Kidney disease, chronic, stage II (GFR 60-89 ml/min)    Neuropathy    Neuropathy associated with endocrine disorder ([place])    Obesity (BMI 30-39.9)    Osteopenia    Visual disturbance     Past Surgical History:  Procedure Laterality Date   arm surgery Right    plantar faci      Family History  Problem Relation Age of Onset   Diabetes type II Mother    Stroke Mother    Hypertension Mother    Heart disease Father  Hypertension Father    Diabetes Father    Heart attack Father    Heart disease Brother    Hypertension Brother    Diabetes Brother    Kidney disease Brother    Diabetes Sister    Asthma Maternal Grandmother    Hypertension Maternal Grandmother    Heart attack Maternal Grandmother    Heart attack Maternal Grandfather    Heart attack Paternal Grandmother    Diabetes Paternal Grandfather    Heart attack Paternal Grandfather     Diabetes Brother    Heart attack Brother    Diabetes Brother    Diabetes Brother    Other Brother        Bright's Disease   Leukemia Other        brother's grandson   Breast cancer Neg Hx     Social History:  reports that she has never smoked. She has never used smokeless tobacco. She reports that she does not drink alcohol and does not use drugs.    Review of Systems    She is on methotrexate tablets from her ophthalmologist for inflammatory disease Her vision is improved       Lipids: These have been controlled with Lipitor 20 mg daily as of 11/2018 She normally has lipids checked every July  80      Lab Results  Component Value Date   CHOL 162 09/13/2020   HDL 73.20 09/13/2020   LDLCALC 71 09/13/2020   LDLDIRECT 58.0 12/25/2021   TRIG 91.0 09/13/2020   CHOLHDL 2 09/13/2020                  The blood pressure history as follows  Blood pressure usually controlled without any treatment, previously lisinopril stopped  because of low blood pressure Does take [place] Also monitors at home  BP Readings from Last 3 Encounters:  05/03/22 122/80  12/25/21 132/84  09/25/21 118/82        She has a long history of Numbness, pain, tingling and burning in feet    Symptoms being treated with  gabapentin to 600 mg 3 times a day   Foot exam in 6/23 showed decreased monofilament sensation in the left toes   Her TSH baseline was 6.2, she has been given 25 mcg levothyroxine by her PCP No recent TSH available  Lab Results  Component Value Date   TSH 1.92 07/24/2021   TSH 2.48 09/13/2020   TSH 2.32 03/28/2020   FREET4 0.96 09/13/2020   FREET4 0.79 03/28/2020     Physical Examination:  BP 122/80   Pulse 75   Ht 5' 6.5" (1.689 m)   Wt 205 lb 12.8 oz (93.4 kg)   SpO2 98%   BMI 32.72 kg/m      ASSESSMENT:  Diabetes type 2, insulin requiring with obesity  She is on a regimen of TRESIBA 32 units daily, Humalog twice daily, Ozempic 2 weekly,   metformin 2 g and  Farxiga 10 mg  See history of present illness for detailed discussion of current diabetes management, blood sugar patterns and problems identified  Her A1c is 7.3, previously at 7.1  Recent freestyle libre sensor download again shows inconsistent and incomplete postprandial control She is either not taking enough insulin to cover her meals and snacks or eating excessive carbohydrates and sweets Recently not exercising Despite taking Ozempic has difficulty losing weight  Discussed that having neuropathy she needs to try and get her blood sugar better controlled  HYPERCHOLESTEROLEMIA: Followed by  PCP and controlled   PLAN:   No change in [place] as yet She will take 18 units of Humalog unless eating a lower carbohydrate meal Also take extra 6 to 8 units of Humalog for desserts or high carbohydrate snacks She needs to watch her diet consistently better She needs to be consistent with diet with cutting back on carbohydrates and portions Also make sure she takes her insulin before starting to eat for meals So possibly may do better with her insulin pump such as the OmniPod 5 Start regular walking for exercise  Will need to follow-up TSH on the next visit as it is overdue for her hypothyroidism  Total visit time including counseling = 30 minutes  Flu vaccine given  Patient Instructions  18 Humalog with usual dinner meals 6-10 units for sweets  Check blood sugars on waking up    Also check blood sugars about 2 hours after meals and do this after different meals by rotation  Recommended blood sugar levels on waking up are 90-130 and about 2 hours after meal is 130-180  Walk daily      Elayne Snare 05/03/2022, 4:57 PM     Note: This office note was prepared with Dragon voice recognition system technology. Any transcriptional errors that result from this process are unintentional.

## 2022-05-03 NOTE — Patient Instructions (Addendum)
18 Humalog with usual dinner meals 6-10 units for sweets  Check blood sugars on waking up    Also check blood sugars about 2 hours after meals and do this after different meals by rotation  Recommended blood sugar levels on waking up are 90-130 and about 2 hours after meal is 130-180  Walk daily

## 2022-05-07 ENCOUNTER — Other Ambulatory Visit: Payer: 59

## 2022-05-08 ENCOUNTER — Encounter: Payer: Self-pay | Admitting: Endocrinology

## 2022-05-23 IMAGING — MG MM DIGITAL SCREENING BILAT W/ TOMO AND CAD
8 series · 8 of 24 positions shown · non-contrast
Comparison: Previous exam(s).

CLINICAL DATA: Screening.

EXAM:
DIGITAL SCREENING BILATERAL MAMMOGRAM WITH TOMOSYNTHESIS AND CAD
TECHNIQUE: Bilateral screening digital craniocaudal and mediolateral oblique
mammograms were obtained. Bilateral screening digital breast
tomosynthesis was performed. The images were evaluated with
computer-aided detection.

[R MLO synth-2D]
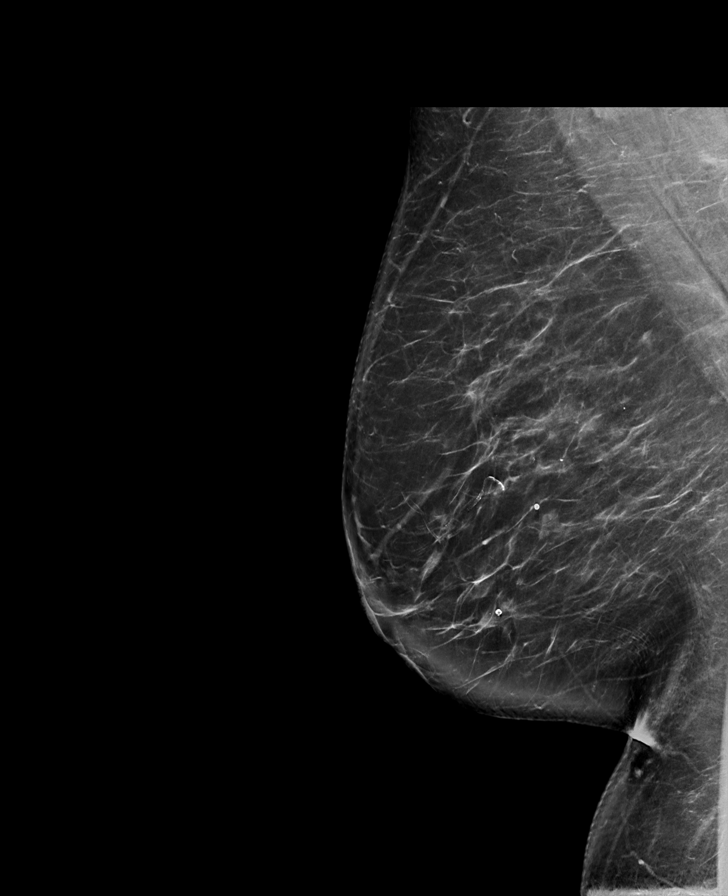

[R CC synth-2D]
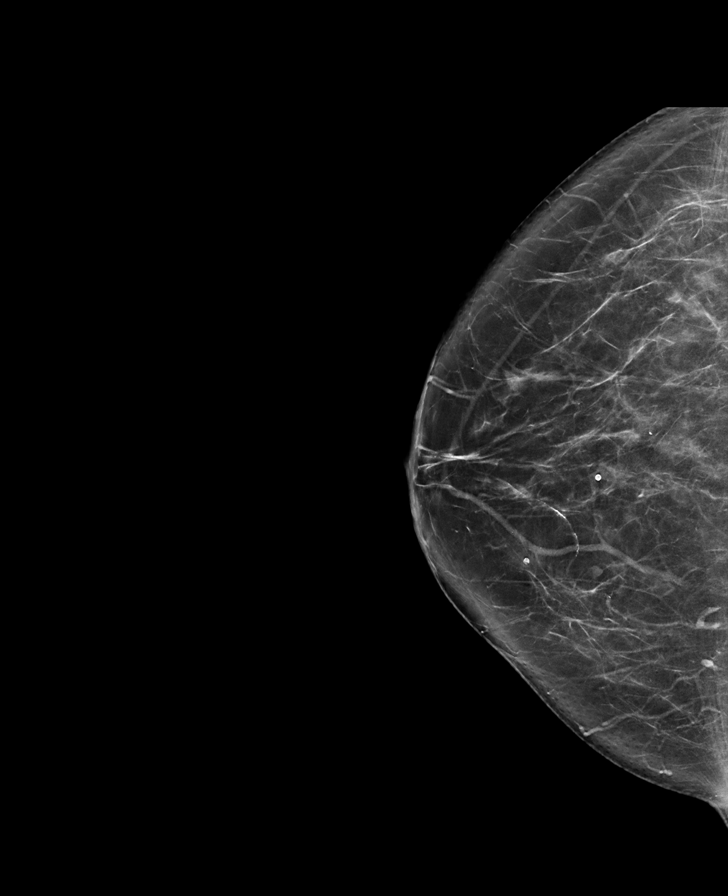

[L MLO synth-2D]
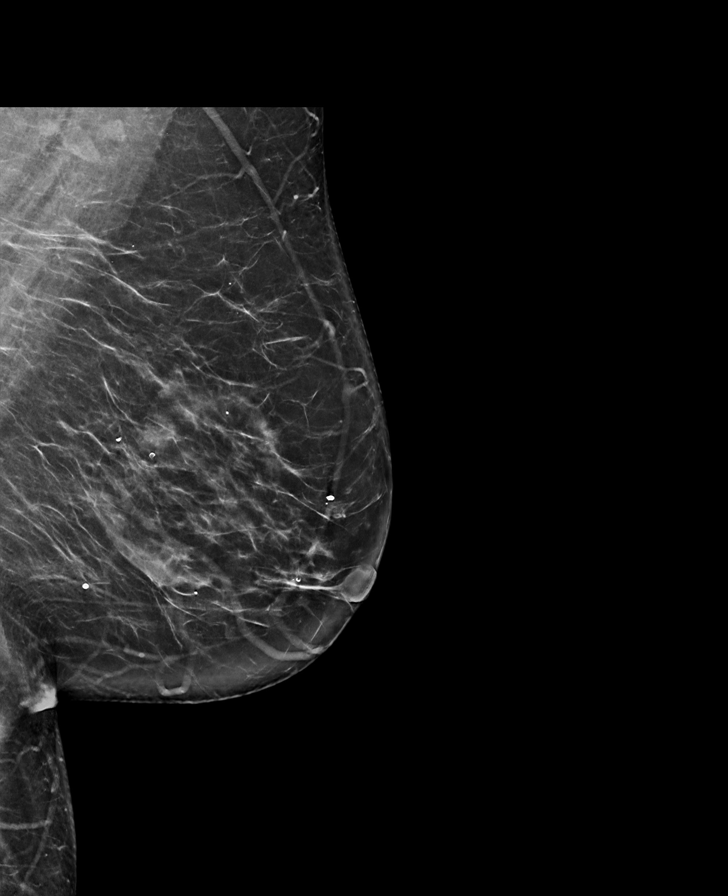

[L CC synth-2D]
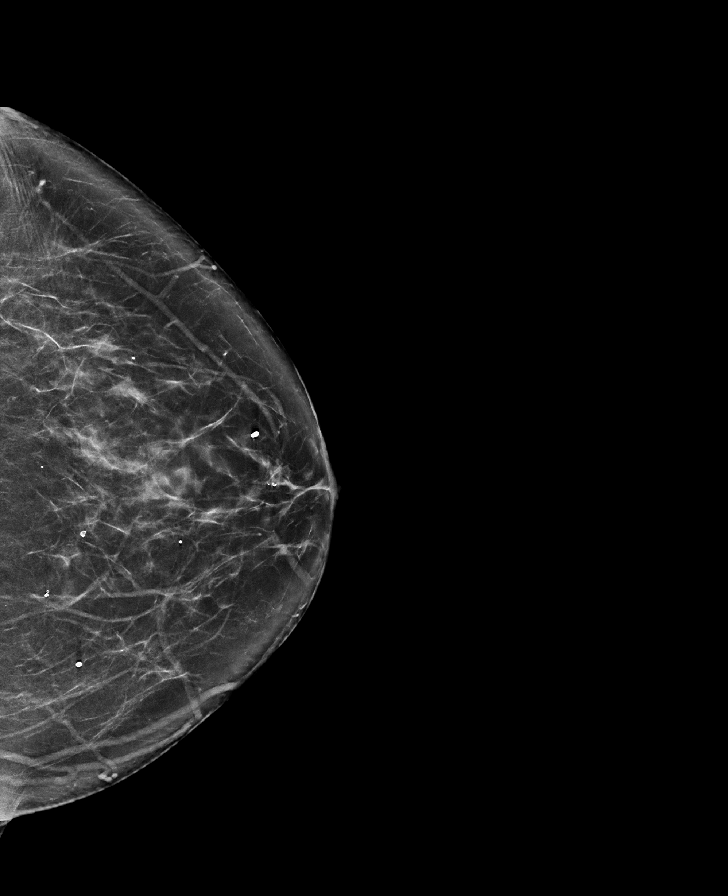

[R MLO tomo · tomo slice 45/90.0]
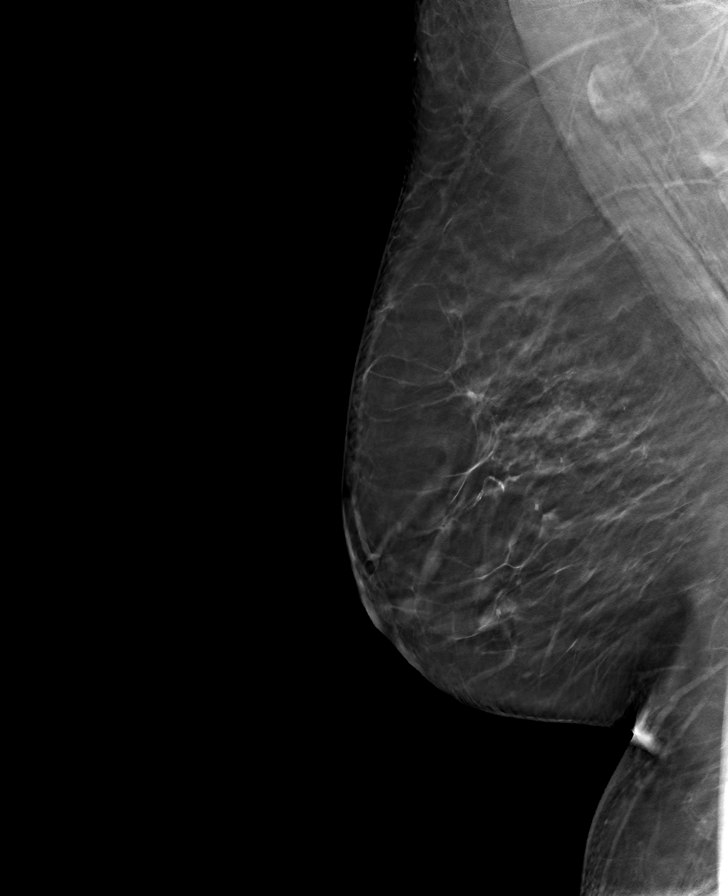

[L CC tomo · tomo slice 37/74.0]
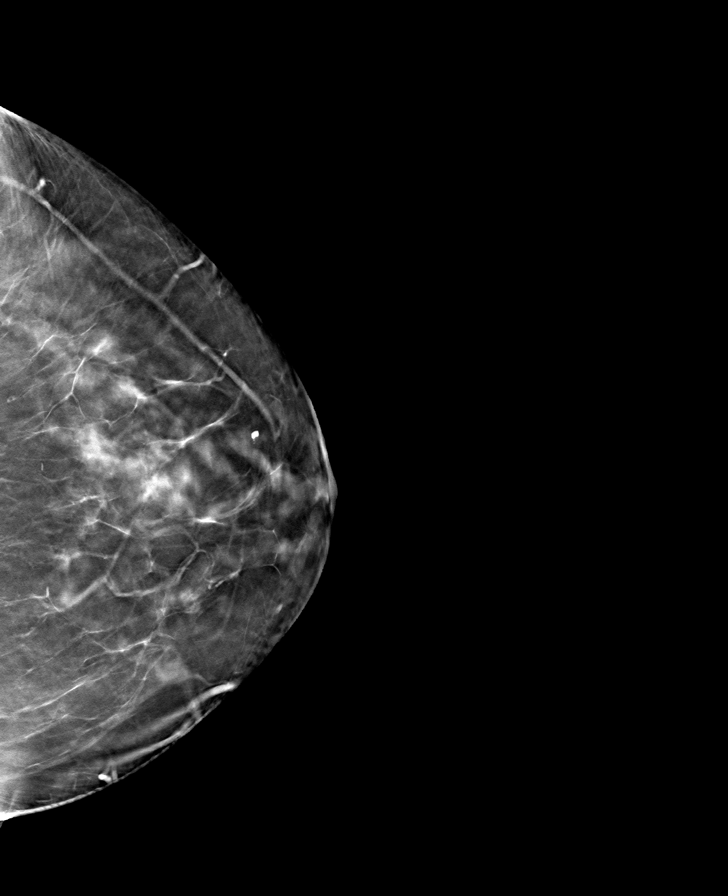

[R CC tomo · tomo slice 39/76.0]
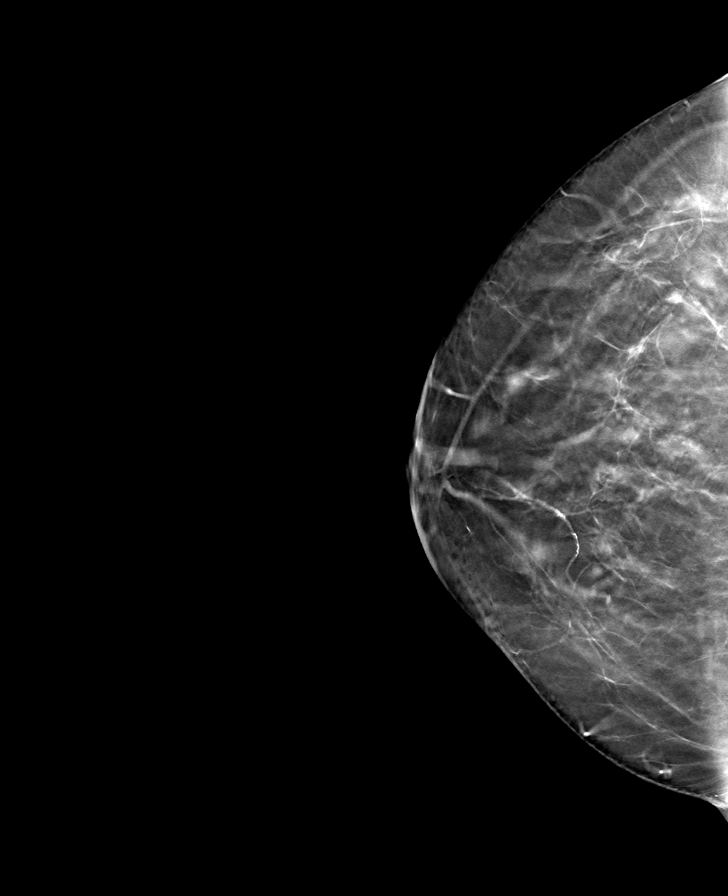

[L MLO tomo · tomo slice 41/82.0]
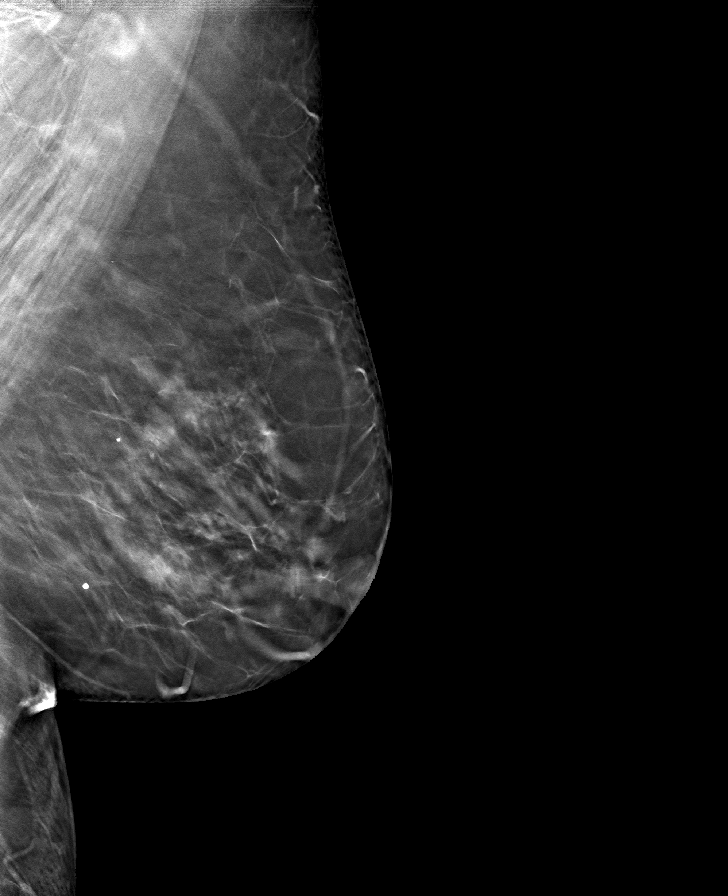

[8 of 24 positions shown; findings below may reference images not displayed]

ACR Breast Density Category b: There are scattered areas of
fibroglandular density.
FINDINGS: There are no findings suspicious for malignancy.
IMPRESSION: No mammographic evidence of malignancy. A result letter of this
screening mammogram will be mailed directly to the patient.

RECOMMENDATION:
Screening mammogram in one year. (Code:51-O-LD2)

BI-RADS CATEGORY  1: Negative.

## 2022-06-25 ENCOUNTER — Other Ambulatory Visit: Payer: Self-pay | Admitting: Endocrinology

## 2022-06-25 DIAGNOSIS — E1165 Type 2 diabetes mellitus with hyperglycemia: Secondary | ICD-10-CM

## 2022-07-26 ENCOUNTER — Telehealth: Payer: Self-pay

## 2022-07-26 ENCOUNTER — Other Ambulatory Visit (HOSPITAL_COMMUNITY): Payer: Self-pay

## 2022-07-26 NOTE — Telephone Encounter (Signed)
Pharmacy Patient Advocate Encounter   Received notification from  [employer] that prior authorization renewal initiated for Ozempic.   PA submitted on 07/26/2022 via CoverMyMeds  Key [id number]  Status is pending  [facility] Patient Advocate

## 2022-07-27 ENCOUNTER — Other Ambulatory Visit (HOSPITAL_COMMUNITY): Payer: Self-pay

## 2022-07-27 ENCOUNTER — Other Ambulatory Visit: Payer: Self-pay | Admitting: Endocrinology

## 2022-07-27 NOTE — Telephone Encounter (Signed)
Patient Advocate Encounter   Received notification from [employer] that prior authorization is required for Ozempic (1 MG/DOSE) '2MG'$ /1.5ML pen-injectors  This medication received a cancelled outcome as it is available to the patient without a prior authorization.   Patient picked up a 90 day supply of this medication 06-26-2023 from [facility] and is not able to fill again until 08-31-2022. If there are any issues at that time, please contact us.   Thank you

## 2022-08-09 ENCOUNTER — Other Ambulatory Visit (HOSPITAL_COMMUNITY): Payer: Self-pay

## 2022-08-22 ENCOUNTER — Ambulatory Visit (INDEPENDENT_AMBULATORY_CARE_PROVIDER_SITE_OTHER): Payer: 59 | Admitting: Endocrinology

## 2022-08-22 ENCOUNTER — Encounter: Payer: Self-pay | Admitting: Endocrinology

## 2022-08-22 VITALS — BP 130/82 | HR 65 | Ht 66.5 in | Wt 206.4 lb

## 2022-08-22 DIAGNOSIS — Z794 Long term (current) use of insulin: Secondary | ICD-10-CM

## 2022-08-22 DIAGNOSIS — E038 Other specified hypothyroidism: Secondary | ICD-10-CM

## 2022-08-22 DIAGNOSIS — E1165 Type 2 diabetes mellitus with hyperglycemia: Secondary | ICD-10-CM | POA: Diagnosis not present

## 2022-08-22 LAB — BASIC METABOLIC PANEL
BUN: 18 mg/dL (ref 6–23)
CO2: 22 mEq/L (ref 19–32)
Calcium: 9.2 mg/dL (ref 8.4–10.5)
Chloride: 105 mEq/L (ref 96–112)
Creatinine, Ser: 0.82 mg/dL (ref 0.40–1.20)
GFR: 74.97 mL/min (ref >60.00)
Glucose, Bld: 194 mg/dL — ABNORMAL HIGH (ref 70–99)
Potassium: 3.8 mEq/L (ref 3.5–5.1)
Sodium: 140 mEq/L (ref 135–145)

## 2022-08-22 LAB — POCT GLYCOSYLATED HEMOGLOBIN (HGB A1C): Hemoglobin A1C: 7.2 % — AB (ref 4.0–5.6)

## 2022-08-22 LAB — TSH: TSH: 1.95 u[IU]/mL (ref 0.35–5.50)

## 2022-08-22 NOTE — Patient Instructions (Signed)
Humalog 14-16 at supper

## 2022-08-22 NOTE — Progress Notes (Signed)
Patient ID: [name], female   DOB: 09-16-1955, 67 y.o.   MRN: [id number]           Reason for Appointment: Follow-up    Referring physician: Kathryne Eriksson  History of Present Illness:          Diagnosis: Type 2 diabetes mellitus, date of diagnosis:  2010       Past history:  She was having symptoms of neuropathy at onset At that time she was started on metformin alone, 500 mg twice a day and this was continued unchanged until recently Apparently she was also given Onglyza about 4 years ago but details of her control at that time is not available Her A1c levels have been mild increase over the last year but she thinks her blood sugars had been generally good with only some readings up to 200. A1c was 7.1 in 1/15 and she was continued on metformin and Onglyza When her A1c was 7.9 in August she was told to double her metformin to 2 g daily She  had blood sugars of 200 or more prior to her initial consultation  Her Trulicity was changed to Victoza because of diarrhea and abdominal pain and has been able to tolerate Victoza  1.8 hs  When A1c was 7.6 she was started on Invokana 100 mg in 11/16, this was subsequently increased when her A1c was 7.5 in 10/17 She has been on insulin since 12/2016  Recent history:   INSULIN regimen: Tresiba  32 units daily in am, Humalog usually 8  in am and 12 units at suppertime  Non-insulin hypoglycemic drugs the patient is taking are:  metformin ER 1 g daily, Farxiga 10 mg daily, Ozempic 2 mg weekly  Her A1c is 7.2 and about the same  Current blood sugar patterns and problems identified: She has better blood sugars compared to her last visit and better time in range She has tried to do a little better on her diet and less snacking However she has not increased her evening dose appropriately, since sugars are frequently higher after dinner and continues to take only 12 units Her waking up blood sugars are usually controlled with the current dose of  [place] She is occasionally eating out and not taking her insulin with her causing high sugars Also sometimes may have more carbohydrate In the last few weeks she has been trying to walk 30 minutes on the days the weather is good However still has not lost any weight She has been able to get her Ozempic fairly regularly and takes it every week No side effects from [place]: Breakfast 10 AM, dinner usually at 5 pm  Side effects from medications have been: Diarrhea, abdominal pain with Trulicity  Interpretation of CGM download for the libre 3: Last 2 weeks:  Overnight blood sugars are the lowest around 6-7 AM with hypoglycemia documented only on 1 night transiently Otherwise overnight blood sugars appear to be mildly increased around midnight and generally decreasing by morning Her blood sugars are overall tending to be mildly increased after her first meal at midday but continued to rise the rest of the evening overall Postprandial readings show frequent spikes after her late afternoon or evening meals occasionally going over 300 No hypoglycemia during the day Blood sugar data was absent between 1/31 and 2/4   CGM use % of time 51  2-week average/GV 161/31  Time in range      74  %  % Time Above  180 19+7  % Time above 250   % Time Below 70      PRE-MEAL Fasting Lunch Dinner Bedtime Overall  Glucose range:       Averages: 122       POST-MEAL PC Breakfast PC Lunch PC Dinner  Glucose range:     Averages:  157 217   Previously   CGM use % of time 77  2-week average/GV 197/38.5  Time in range  48      %  % Time Above 180 26  % Time above 250 25  % Time Below 70 1      PRE-MEAL Fasting Lunch Dinner Bedtime Overall  Glucose range:       Averages:        POST-MEAL PC Breakfast PC Lunch PC Dinner  Glucose range:     Averages: 179   210 247      Self-care: The diet that the patient has been following is: tries to limit carbohydrates .     Meals: 3 meals  per day. Breakfast is yogurt/ cereal/eggs with toast at 9-10 am; , lunch is a sandwich, and dinner at 5 pm will have baked chicken, starch and vegetables.  Has snacks with yogurt, cottage cheese, fruit, nuts and  rice cakes               Dietician visit, most recent: 9/15              Weight history:  Wt Readings from Last 3 Encounters:  08/22/22 206 lb 6.4 oz (93.6 kg)  05/03/22 205 lb 12.8 oz (93.4 kg)  12/25/21 207 lb 12.8 oz (94.3 kg)    Glycemic control:     Lab Results  Component Value Date   HGBA1C 7.2 (A) 08/22/2022   HGBA1C 7.3 (A) 05/03/2022   HGBA1C 7.1 (A) 12/25/2021   Lab Results  Component Value Date   MICROALBUR <0.7 12/25/2021   LDLCALC 71 09/13/2020   CREATININE 0.82 08/22/2022   0.77  10/24/21    Allergies as of 08/22/2022       Reactions   Bisoprolol-hydrochlorothiazide Swelling, Other (See Comments)   Face and throat became swollen; wheezing also   Relafen [nabumetone] Hives, Shortness Of Breath   Aspirin Other (See Comments)   Irritates patient's IBS and Diverticulitis   Dulaglutide Other (See Comments)   Trulicity: Abdominal pain   Other Other (See Comments)   Seasonal allergies: Runny nose/eyes and congestion   Restasis [cyclosporine] Itching, Rash, Other (See Comments)   Eyes burn and rash develops around the eyes        Medication List        Accurate as of August 22, 2022  3:32 PM. If you have any questions, ask your nurse or doctor.          acetaminophen 650 MG CR tablet Commonly known as: TYLENOL Take 650-1,300 mg by mouth every 8 (eight) hours as needed (for pain or headaches).   albuterol 108 (90 Base) MCG/ACT inhaler Commonly known as: VENTOLIN HFA Inhale into the lungs.   ProAir HFA 108 (90 Base) MCG/ACT inhaler Generic drug: albuterol Inhale 2 puffs into the lungs every 6 (six) hours as needed.   ASPIRIN 81 PO Take 1 tablet by mouth daily.   atorvastatin 20 MG tablet Commonly known as: LIPITOR Take 1 tablet (20  mg total) by mouth daily.   BIOTIN PO biotin   butalbital-acetaminophen-caffeine 50-325-40 MG tablet Commonly known as: FIORICET Take 1 tablet  by mouth every 6 (six) hours as needed for headache. Do not refill in less than 30 days.   Farxiga 10 MG Tabs tablet Generic drug: dapagliflozin propanediol TAKE 1 TABLET BY MOUTH EVERY DAY   FOLIC ACID PO Folic acid   FreeStyle Libre 3 Sensor Misc APPLY 1 SENSOR ON UPPER ARM EVERY 14 DAYS FOR CONTINUOUS GLUCOSE MONITORING   gabapentin 600 MG tablet Commonly known as: NEURONTIN TAKE 1 TABLET 3 TIMES PER DAY AS NEEDED   insulin lispro 100 UNIT/ML KwikPen Commonly known as: HumaLOG KwikPen INJECT 8 UNITS SUBCUTANEOUSLY BEFORE MEALS   Insulin Pen Needle 32G X 4 MM Misc Use one per day to inject victoza   levothyroxine 25 MCG tablet Commonly known as: SYNTHROID Take 25 mcg by mouth daily before breakfast.   metFORMIN 500 MG 24 hr tablet Commonly known as: GLUCOPHAGE-XR TAKE 2 TABLETS BY MOUTH TWICE A DAY   methocarbamol 500 MG tablet Commonly known as: ROBAXIN Take 1 tablet by mouth daily.   methotrexate 2.5 MG tablet Commonly known as: RHEUMATREX Take 2.5 mg by mouth once a week. TAKE 10 TABLETS BY MOUTH AT ONE TIME EVERY Monday.   nitroGLYCERIN 0.4 MG SL tablet Commonly known as: NITROSTAT Place 1 tablet (0.4 mg total) under the tongue every 5 (five) minutes as needed for chest pain.   OneTouch Delica Lancets 48N Misc Use OneTouch Delica Lancets to test blood sugars twice daily. DX: E11.49   OneTouch Verio Flex System w/Device Kit Use OneTouch Verio Flex to check blood sugar twice daily. DX: E11.49   OneTouch Verio test strip Generic drug: glucose blood Use OneTouch Verio test strips to test blood sugars twice daily. DX: E11.49   Ozempic (2 MG/DOSE) 8 MG/3ML Sopn Generic drug: Semaglutide (2 MG/DOSE) INJECT 2 MG ONCE WEEKLY   sertraline 100 MG tablet Commonly known as: ZOLOFT Take 200 mg by mouth daily. Take 2  tablets by mouth once daily.   Tyler Aas FlexTouch 100 UNIT/ML FlexTouch Pen Generic drug: insulin degludec INJECT 30 UNITS UNDER THE SKIN ONCE DAILY.        Allergies:  Allergies  Allergen Reactions   Bisoprolol-Hydrochlorothiazide Swelling and Other (See Comments)    Face and throat became swollen; wheezing also   Relafen [Nabumetone] Hives and Shortness Of Breath   Aspirin Other (See Comments)    Irritates patient's IBS and Diverticulitis   Dulaglutide Other (See Comments)    Trulicity: Abdominal pain   Other Other (See Comments)    Seasonal allergies: Runny nose/eyes and congestion   Restasis [Cyclosporine] Itching, Rash and Other (See Comments)    Eyes burn and rash develops around the eyes    Past Medical History:  Diagnosis Date   Colon polyps    Depression 05/10/2013   Diabetes mellitus without complication ([place])    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[place])    Diverticulosis    Headache    Hypertension    Irritable bowel    Kidney disease, chronic, stage II (GFR 60-89 ml/min)    Neuropathy    Neuropathy associated with endocrine disorder ([place])    Obesity (BMI 30-39.9)    Osteopenia    Visual disturbance     Past Surgical History:  Procedure Laterality Date   arm surgery Right    plantar faci      Family History  Problem Relation Age of Onset   Diabetes type II Mother    Stroke Mother    Hypertension Mother    Heart disease Father  Hypertension Father    Diabetes Father    Heart attack Father    Heart disease Brother    Hypertension Brother    Diabetes Brother    Kidney disease Brother    Diabetes Sister    Asthma Maternal Grandmother    Hypertension Maternal Grandmother    Heart attack Maternal Grandmother    Heart attack Maternal Grandfather    Heart attack Paternal Grandmother    Diabetes Paternal Grandfather    Heart attack Paternal Grandfather    Diabetes Brother    Heart attack Brother    Diabetes Brother    Diabetes Brother    Other  Brother        Bright's Disease   Leukemia Other        brother's grandson   Breast cancer Neg Hx     Social History:  reports that she has never smoked. She has never used smokeless tobacco. She reports that she does not drink alcohol and does not use drugs.    Review of Systems    She is on methotrexate tablets from her ophthalmologist for inflammatory disease Her vision is improved       Lipids: These have been controlled with Lipitor 20 mg daily as of 11/2018 She normally has lipids checked every July  80      Lab Results  Component Value Date   CHOL 162 09/13/2020   HDL 73.20 09/13/2020   LDLCALC 71 09/13/2020   LDLDIRECT 58.0 12/25/2021   TRIG 91.0 09/13/2020   CHOLHDL 2 09/13/2020                  The blood pressure history as follows  Blood pressure usually controlled without any treatment, previously lisinopril stopped  because of low blood pressure Does take [place] Also monitors at home  BP Readings from Last 3 Encounters:  08/22/22 130/82  05/03/22 122/80  12/25/21 132/84        She has a long history of Numbness, pain, tingling and burning in feet    Symptoms being treated with  gabapentin to 600 mg 3 times a day   Foot exam in 6/23 showed decreased monofilament sensation in the left toes   Her TSH baseline was 6.2, she has been given 25 mcg levothyroxine by her PCP   Lab Results  Component Value Date   TSH 1.95 08/22/2022   TSH 1.92 07/24/2021   TSH 2.48 09/13/2020   FREET4 0.96 09/13/2020   FREET4 0.79 03/28/2020     Physical Examination:  BP 130/82 (BP Location: Left Arm, Patient Position: Sitting, Cuff Size: Normal)   Pulse 65   Ht 5' 6.5" (1.689 m)   Wt 206 lb 6.4 oz (93.6 kg)   SpO2 95%   BMI 32.81 kg/m      ASSESSMENT:  Diabetes type 2, insulin requiring with obesity  She is on a regimen of TRESIBA 32 units daily, Humalog twice daily, Ozempic 2 weekly,   metformin 2 g and Farxiga 10 mg  See history of present illness  for detailed discussion of current diabetes management, blood sugar patterns and problems identified  Her A1c is 7.2  She is on basal bolus insulin regimen with fair control and recent time in range 74% She still has postprandial hyperglycemia as discussed above from inadequate mealtime dose in the evening Despite taking 2 mg Ozempic has difficulty losing weight but may do better with regular exercise  Discussed that having neuropathy she needs to try and  get her blood sugar better controlled  HYPERCHOLESTEROLEMIA: Followed by PCP annually   PLAN:   No change in Tresiba 32 units daily She will take at least 14-16 units of Humalog unless eating a lower carbohydrate meal and emphasized that this should be taken consistently without lowering her dose to keep her postprandial readings from going over 180 For now can continue the same dose at her lunch No change in non-insulin hypoglycemic drug regimen However may consider Mounjaro for better potential weight loss and postprandial control Encouraged her to walk more regularly  Will need to follow-up TSH  as it is overdue for her hypothyroidism  Total visit time including counseling = 30 minutes   Patient Instructions  Humalog 14-16 at supper        Elayne Snare 08/22/2022, 3:32 PM     Note: This office note was prepared with Dragon voice recognition system technology. Any transcriptional errors that result from this process are unintentional.

## 2022-08-23 ENCOUNTER — Encounter: Payer: Self-pay | Admitting: Endocrinology

## 2022-08-30 ENCOUNTER — Other Ambulatory Visit: Payer: Self-pay | Admitting: Endocrinology

## 2022-09-25 ENCOUNTER — Other Ambulatory Visit: Payer: Self-pay | Admitting: Family Medicine

## 2022-09-25 DIAGNOSIS — Z1231 Encounter for screening mammogram for malignant neoplasm of breast: Secondary | ICD-10-CM

## 2022-09-26 ENCOUNTER — Other Ambulatory Visit: Payer: Self-pay | Admitting: Endocrinology

## 2022-09-26 DIAGNOSIS — E1165 Type 2 diabetes mellitus with hyperglycemia: Secondary | ICD-10-CM

## 2022-10-25 ENCOUNTER — Other Ambulatory Visit: Payer: Self-pay | Admitting: Endocrinology

## 2022-11-08 ENCOUNTER — Ambulatory Visit
Admission: RE | Admit: 2022-11-08 | Discharge: 2022-11-08 | Disposition: A | Discharge status: Home / Self Care | Payer: 59 | Source: Ambulatory Visit | Attending: Family Medicine | Admitting: Family Medicine

## 2022-11-08 DIAGNOSIS — Z1231 Encounter for screening mammogram for malignant neoplasm of breast: Secondary | ICD-10-CM

## 2022-11-21 ENCOUNTER — Other Ambulatory Visit: Payer: Self-pay | Admitting: Endocrinology

## 2022-11-23 ENCOUNTER — Other Ambulatory Visit: Payer: Self-pay | Admitting: Endocrinology

## 2022-11-23 DIAGNOSIS — Z794 Long term (current) use of insulin: Secondary | ICD-10-CM

## 2022-12-21 ENCOUNTER — Encounter: Payer: Self-pay | Admitting: Endocrinology

## 2022-12-21 ENCOUNTER — Ambulatory Visit (INDEPENDENT_AMBULATORY_CARE_PROVIDER_SITE_OTHER): Payer: 59 | Admitting: Endocrinology

## 2022-12-21 VITALS — BP 128/80 | HR 56 | Ht 66.5 in | Wt 201.0 lb

## 2022-12-21 DIAGNOSIS — E1165 Type 2 diabetes mellitus with hyperglycemia: Secondary | ICD-10-CM

## 2022-12-21 DIAGNOSIS — Z794 Long term (current) use of insulin: Secondary | ICD-10-CM | POA: Diagnosis not present

## 2022-12-21 DIAGNOSIS — E1142 Type 2 diabetes mellitus with diabetic polyneuropathy: Secondary | ICD-10-CM

## 2022-12-21 DIAGNOSIS — I10 Essential (primary) hypertension: Secondary | ICD-10-CM

## 2022-12-21 LAB — POCT GLYCOSYLATED HEMOGLOBIN (HGB A1C): Hemoglobin A1C: 7.4 % — AB (ref 4.0–5.6)

## 2022-12-21 LAB — MICROALBUMIN / CREATININE URINE RATIO
Creatinine,U: 91.4 mg/dL
Microalb Creat Ratio: 0.8 mg/g (ref 0.0–30.0)
Microalb, Ur: <0.7 mg/dL (ref 0.0–1.9)

## 2022-12-21 LAB — BASIC METABOLIC PANEL
BUN: 11 mg/dL (ref 6–23)
CO2: 28 mEq/L (ref 19–32)
Calcium: 9.1 mg/dL (ref 8.4–10.5)
Chloride: 103 mEq/L (ref 96–112)
Creatinine, Ser: 0.83 mg/dL (ref 0.40–1.20)
GFR: 73.72 mL/min (ref >60.00)
Glucose, Bld: 158 mg/dL — ABNORMAL HIGH (ref 70–99)
Potassium: 3.8 mEq/L (ref 3.5–5.1)
Sodium: 139 mEq/L (ref 135–145)

## 2022-12-21 MED ORDER — TIRZEPATIDE 5 MG/0.5ML ~~LOC~~ SOAJ: 5.0000 mg | SUBCUTANEOUS | 0 refills | Status: DC

## 2022-12-21 NOTE — Progress Notes (Unsigned)
Patient ID: [name], female   DOB: 1955/11/29, 67 y.o.   MRN: [id number]           Reason for Appointment: Follow-up    Referring physician: Benedetto Goad  History of Present Illness:          Diagnosis: Type 2 diabetes mellitus, date of diagnosis:  2010       Past history:  She was having symptoms of neuropathy at onset At that time she was started on metformin alone, 500 mg twice a day and this was continued unchanged until recently Apparently she was also given Onglyza about 4 years ago but details of her control at that time is not available Her A1c levels have been mild increase over the last year but she thinks her blood sugars had been generally good with only some readings up to 200. A1c was 7.1 in 1/15 and she was continued on metformin and Onglyza When her A1c was 7.9 in August she was told to double her metformin to 2 g daily She  had blood sugars of 200 or more prior to her initial consultation  Her Trulicity was changed to Victoza because of diarrhea and abdominal pain and has been able to tolerate Victoza  1.8 hs  When A1c was 7.6 she was started on Invokana 100 mg in 11/16, this was subsequently increased when her A1c was 7.5 in 10/17 She has been on insulin since 12/2016  Recent history:   INSULIN regimen: Tresiba  32 units daily in am, Humalog usually 8  in am and 10-12 units at suppertime  Non-insulin hypoglycemic drugs the patient is taking are:  metformin ER 1 g daily, Farxiga 10 mg daily, Ozempic 2 mg weekly  Her A1c is 7.2 and about the same  Current blood sugar patterns and problems identified: She has somewhat worsening blood sugars compared to her last visit Again she thinks this is from poor diet She is not able to estimate how much insulin to take for different kind of meals and likely taking inadequate insulin most of the time Last evening with eating a dessert and a [employer] sandwich for blood sugar was increased up to 239 but she only took 10 units  to cover this meal Also not doing much walking, she said that she is involved with her mother's health care She has generally been able to take her Ozempic and [place] regularly Appears to be losing a little weight however Evaristo Bury has been continued unchanged and overnight sugars are discussed below  Mealtimes: Breakfast 10 AM, dinner usually at 5 pm  Side effects from medications have been: Diarrhea, abdominal pain with Trulicity  Interpretation of CGM download for the libre 3: Last 2 weeks:  Overnight blood sugars are quite variable but generally higher in the first 4 hours and then variably lower the rest of the night without hypoglycemia Overall blood sugars generally higher the rest of the day especially early evening again with no consistent pattern She has intermittent postprandial spikes generally occurring in the evenings between 4 and 8 PM, occasionally exceeding 250 Average blood sugar after dinner is the highest at 206 Time in range is not as good as the previous visit No hypoglycemia        Prior   CGM use % of time 51  2-week average/GV 161/31  Time in range      74  %  % Time Above 180 19+7  % Time above 250   % Time Below  70      PRE-MEAL Fasting Lunch Dinner Bedtime Overall  Glucose range:       Averages: 122       POST-MEAL PC Breakfast PC Lunch PC Dinner  Glucose range:     Averages:  157 217     Self-care: The diet that the patient has been following is: tries to limit carbohydrates .     Meals: 3 meals per day. Breakfast is yogurt/ cereal/eggs with toast at 9-10 am; , lunch is a sandwich, and dinner at 5 pm will have baked chicken, starch and vegetables.  Has snacks with yogurt, cottage cheese, fruit, nuts and  rice cakes               Dietician visit, most recent: 9/15              Weight history:  Wt Readings from Last 3 Encounters:  12/21/22 201 lb (91.2 kg)  08/22/22 206 lb 6.4 oz (93.6 kg)  05/03/22 205 lb 12.8 oz (93.4 kg)     Glycemic control:     Lab Results  Component Value Date   HGBA1C 7.2 (A) 08/22/2022   HGBA1C 7.3 (A) 05/03/2022   HGBA1C 7.1 (A) 12/25/2021   Lab Results  Component Value Date   MICROALBUR <0.7 12/25/2021   LDLCALC 71 09/13/2020   CREATININE 0.82 08/22/2022   0.77  10/24/21    Allergies as of 12/21/2022       Reactions   Bisoprolol-hydrochlorothiazide Swelling, Other (See Comments)   Face and throat became swollen; wheezing also   Relafen [nabumetone] Hives, Shortness Of Breath   Aspirin Other (See Comments)   Irritates patient's IBS and Diverticulitis   Dulaglutide Other (See Comments)   Trulicity: Abdominal pain   Other Other (See Comments)   Seasonal allergies: Runny nose/eyes and congestion   Restasis [cyclosporine] Itching, Rash, Other (See Comments)   Eyes burn and rash develops around the eyes        Medication List        Accurate as of December 21, 2022 10:28 AM. If you have any questions, ask your nurse or doctor.          acetaminophen 650 MG CR tablet Commonly known as: TYLENOL Take 650-1,300 mg by mouth every 8 (eight) hours as needed (for pain or headaches).   albuterol 108 (90 Base) MCG/ACT inhaler Commonly known as: VENTOLIN HFA Inhale into the lungs.   ProAir HFA 108 (90 Base) MCG/ACT inhaler Generic drug: albuterol Inhale 2 puffs into the lungs every 6 (six) hours as needed.   ASPIRIN 81 PO Take 1 tablet by mouth daily.   atorvastatin 20 MG tablet Commonly known as: LIPITOR Take 1 tablet (20 mg total) by mouth daily.   BIOTIN PO biotin   butalbital-acetaminophen-caffeine 50-325-40 MG tablet Commonly known as: FIORICET Take 1 tablet by mouth every 6 (six) hours as needed for headache. Do not refill in less than 30 days.   Farxiga 10 MG Tabs tablet Generic drug: dapagliflozin propanediol TAKE 1 TABLET BY MOUTH EVERY DAY   FOLIC ACID PO Folic acid   FreeStyle Libre 3 Sensor Misc APPLY 1 SENSOR ON UPPER ARM EVERY 14 DAYS FOR  CONTINUOUS GLUCOSE MONITORING   gabapentin 600 MG tablet Commonly known as: NEURONTIN TAKE 1 TABLET 3 TIMES PER DAY AS NEEDED   insulin lispro 100 UNIT/ML KwikPen Commonly known as: HumaLOG KwikPen INJECT 8 UNITS SUBCUTANEOUSLY BEFORE MEALS   Insulin Pen Needle 32G X 4 MM  Misc Use one per day to inject victoza   levothyroxine 25 MCG tablet Commonly known as: SYNTHROID Take 25 mcg by mouth daily before breakfast.   metFORMIN 500 MG 24 hr tablet Commonly known as: GLUCOPHAGE-XR TAKE 2 TABLETS BY MOUTH TWICE A DAY   methocarbamol 500 MG tablet Commonly known as: ROBAXIN Take 1 tablet by mouth daily.   methotrexate 2.5 MG tablet Commonly known as: RHEUMATREX Take 2.5 mg by mouth once a week. TAKE 10 TABLETS BY MOUTH AT ONE TIME EVERY Monday.   nitroGLYCERIN 0.4 MG SL tablet Commonly known as: NITROSTAT Place 1 tablet (0.4 mg total) under the tongue every 5 (five) minutes as needed for chest pain.   OneTouch Delica Lancets 30G Misc Use OneTouch Delica Lancets to test blood sugars twice daily. DX: E11.49   OneTouch Verio Flex System w/Device Kit Use OneTouch Verio Flex to check blood sugar twice daily. DX: E11.49   OneTouch Verio test strip Generic drug: glucose blood Use OneTouch Verio test strips to test blood sugars twice daily. DX: E11.49   Ozempic (2 MG/DOSE) 8 MG/3ML Sopn Generic drug: Semaglutide (2 MG/DOSE) INJECT 2 MG ONCE WEEKLY   sertraline 100 MG tablet Commonly known as: ZOLOFT Take 200 mg by mouth daily. Take 2 tablets by mouth once daily.   Evaristo Bury FlexTouch 100 UNIT/ML FlexTouch Pen Generic drug: insulin degludec INJECT 30 UNITS UNDER THE SKIN ONCE DAILY.        Allergies:  Allergies  Allergen Reactions   Bisoprolol-Hydrochlorothiazide Swelling and Other (See Comments)    Face and throat became swollen; wheezing also   Relafen [Nabumetone] Hives and Shortness Of Breath   Aspirin Other (See Comments)    Irritates patient's IBS and  Diverticulitis   Dulaglutide Other (See Comments)    Trulicity: Abdominal pain   Other Other (See Comments)    Seasonal allergies: Runny nose/eyes and congestion   Restasis [Cyclosporine] Itching, Rash and Other (See Comments)    Eyes burn and rash develops around the eyes    Past Medical History:  Diagnosis Date   Colon polyps    Depression 05/10/2013   Diabetes mellitus without complication (HCC)    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HCC)    Diverticulosis    Headache    Hypertension    Irritable bowel    Kidney disease, chronic, stage II (GFR 60-89 ml/min)    Neuropathy    Neuropathy associated with endocrine disorder (HCC)    Obesity (BMI 30-39.9)    Osteopenia    Visual disturbance     Past Surgical History:  Procedure Laterality Date   arm surgery Right    plantar faci      Family History  Problem Relation Age of Onset   Diabetes type II Mother    Stroke Mother    Hypertension Mother    Heart disease Father    Hypertension Father    Diabetes Father    Heart attack Father    Heart disease Brother    Hypertension Brother    Diabetes Brother    Kidney disease Brother    Diabetes Sister    Asthma Maternal Grandmother    Hypertension Maternal Grandmother    Heart attack Maternal Grandmother    Heart attack Maternal Grandfather    Heart attack Paternal Grandmother    Diabetes Paternal Grandfather    Heart attack Paternal Grandfather    Diabetes Brother    Heart attack Brother    Diabetes Brother    Diabetes Brother  Other Brother        Bright's Disease   Leukemia Other        brother's grandson   Breast cancer Neg Hx     Social History:  reports that she has never smoked. She has never used smokeless tobacco. She reports that she does not drink alcohol and does not use drugs.    Review of Systems    She is on methotrexate tablets from her ophthalmologist for inflammatory disease Her vision is improved       Lipids: These have been controlled with  Lipitor 20 mg daily as of 11/2018 She normally has lipids checked every July  Last LDL was 80      Lab Results  Component Value Date   CHOL 162 09/13/2020   HDL 73.20 09/13/2020   LDLCALC 71 09/13/2020   LDLDIRECT 58.0 12/25/2021   TRIG 91.0 09/13/2020   CHOLHDL 2 09/13/2020                  The blood pressure history as follows  Blood pressure usually controlled without any treatment, previously lisinopril stopped  because of low blood pressure Likely better with Farxiga  BP Readings from Last 3 Encounters:  12/21/22 128/80  08/22/22 130/82  05/03/22 122/80        She has a long history of Numbness, pain, tingling and burning in feet    Symptoms being treated with  gabapentin to 600 mg 3 times a day   Foot exam in 6/23 showed decreased monofilament sensation in the left toes   Her TSH baseline was 6.2, she has been given 25 mcg levothyroxine by her PCP   Lab Results  Component Value Date   TSH 1.95 08/22/2022   TSH 1.92 07/24/2021   TSH 2.48 09/13/2020   FREET4 0.96 09/13/2020   FREET4 0.79 03/28/2020     Physical Examination:  BP 128/80   Pulse (!) 56   Ht 5' 6.5" (1.689 m)   Wt 201 lb (91.2 kg)   SpO2 98%   BMI 31.96 kg/m   Diabetic Foot Exam - Simple   Simple Foot Form Diabetic Foot exam was performed with the following findings: Yes   Visual Inspection No deformities, no ulcerations, no other skin breakdown bilaterally: Yes Sensation Testing See comments: Yes Pulse Check Posterior Tibialis and Dorsalis pulse intact bilaterally: Yes Comments Markedly decreased monofilament sensation in the distal toes No edema Pedal pulses palpable but better on posterior tibialis       ASSESSMENT:  Diabetes type 2, insulin requiring with obesity  She is on a regimen of TRESIBA 32 units daily, Humalog twice daily, Ozempic 2 weekly,   metformin 2 g and Farxiga 10 mg  See history of present illness for detailed discussion of current diabetes management,  blood sugar patterns and problems identified  Her A1c is 7.2  Getting variable postprandial hyperglycemia as discussed above from inadequate mealtime doses and inconsistent diet in the evening and sometimes at lunch also Despite taking 2 mg Ozempic has difficulty with postprandial hyperglycemia and portion control as well as only minimal weight loss recently Also not exercising much  HYPERCHOLESTEROLEMIA: Followed by PCP annually   PLAN:   Consultation with dietitian to help adjust her meal planning as well as adjustment of insulin Continue Tresiba 32 units daily but if blood sugars are frequently out of range of the 9001 140 range in the morning she can go up or down 2 units She will take at  least 14-16 units of Humalog when she is eating a full meal with dessert or higher fat intake or more carbohydrates Will prescribe Mounjaro for better potential weight loss and postprandial control compared to Ozempic but not clear if this is going to be covered, will do prior authorization if needed Encouraged her to walk for exercise regularly  Lipids to be checked by her PCP  Will check microalbumin and renal function today  Total visit time including counseling = 30 minutes   There are no Patient Instructions on file for this visit.      Reather Littler 12/21/2022, 10:28 AM     Note: This office note was prepared with Dragon voice recognition system technology. Any transcriptional errors that result from this process are unintentional.  Addendum: Labs normal

## 2023-01-11 ENCOUNTER — Encounter: Payer: Self-pay | Admitting: Endocrinology

## 2023-02-07 ENCOUNTER — Other Ambulatory Visit: Payer: Self-pay | Admitting: Endocrinology

## 2023-03-04 ENCOUNTER — Other Ambulatory Visit: Payer: Self-pay | Admitting: Endocrinology

## 2023-04-03 ENCOUNTER — Ambulatory Visit (INDEPENDENT_AMBULATORY_CARE_PROVIDER_SITE_OTHER): Payer: 59 | Admitting: Endocrinology

## 2023-04-03 ENCOUNTER — Encounter: Payer: Self-pay | Admitting: Endocrinology

## 2023-04-03 VITALS — BP 130/80 | HR 74 | Resp 18 | Ht 66.5 in | Wt 201.6 lb

## 2023-04-03 DIAGNOSIS — Z7984 Long term (current) use of oral hypoglycemic drugs: Secondary | ICD-10-CM

## 2023-04-03 DIAGNOSIS — Z7985 Long-term (current) use of injectable non-insulin antidiabetic drugs: Secondary | ICD-10-CM

## 2023-04-03 DIAGNOSIS — E1140 Type 2 diabetes mellitus with diabetic neuropathy, unspecified: Secondary | ICD-10-CM | POA: Diagnosis not present

## 2023-04-03 DIAGNOSIS — Z794 Long term (current) use of insulin: Secondary | ICD-10-CM | POA: Diagnosis not present

## 2023-04-03 LAB — POCT GLYCOSYLATED HEMOGLOBIN (HGB A1C): Hemoglobin A1C: 7.2 % — AB (ref 4.0–5.6)

## 2023-04-03 MED ORDER — OZEMPIC (2 MG/DOSE) 8 MG/3ML ~~LOC~~ SOPN: PEN_INJECTOR | SUBCUTANEOUS | 1 refills | Status: DC

## 2023-04-03 MED ORDER — FREESTYLE LIBRE 3 PLUS SENSOR MISC: 1.0000 | 2 refills | Status: DC

## 2023-04-03 NOTE — Progress Notes (Signed)
Outpatient Endocrinology Note Jamie Yenny Kosa, MD  04/03/23  Patient's Name: [name]    DOB: 08/18/1955    MRN: [id number]                                                    REASON OF VISIT: Follow up for type 2 diabetes mellitus  PCP: Barbie Banner, MD  HISTORY OF PRESENT ILLNESS:   [name] is a 67 y.o. old female with past medical history listed below, is here for follow up of type 2 diabetes mellitus.   Pertinent Diabetes History: Patient was diagnosed with type 2 diabetes mellitus in 2010.  She has symptoms of neuropathic pain on the onset.  She was initially treated with metformin.  At some point she had taken [facility] as well.  She had been on Trulicity which was later changed to Victoza because of diarrhea and abdominal pain.  She had been on Invokana in the past as well.  She has been on insulin therapy since June 2018.  She has controlled type 2 diabetes mellitus.  Chronic Diabetes Complications : Retinopathy: no. Last ophthalmology exam was done on annually reportedly. Nephropathy: no, Peripheral neuropathy: no Coronary artery disease: no Stroke: no  Relevant comorbidities and cardiovascular risk factors: Obesity: yes Body mass index is 32.05 kg/m.  Hypertension: yes Hyperlipidemia. Yes, on statin  Current / Home Diabetic regimen includes: Tresiba 32 units daily. Humalog 10 - 14 units with meals three times a day. Ozempic 2 mg weekly. Farxiga 10 mg daily. Metformin ER 1000 mg daily.   Prior diabetic medications:  Glycemic data:   Diarrhea with higher dose of metformin.  CONTINUOUS GLUCOSE MONITORING SYSTEM (CGMS) INTERPRETATION: At today's visit, we reviewed CGM downloads. The full report is scanned in the media. Reviewing the CGM trends, blood glucose are as follows:  FreeStyle Libre 3 CGM-  Sensor Download (Sensor download was reviewed and summarized below.) Dates: August 13 to March 11, 2023 for 14 days Sensor Average: 158  Glucose  Management Indicator: 7.1% Glucose Variability: 30.1%  % data captured: 100%  Glycemic Trends:  <54: 0% 54-70: 0% 71-180: 75% 181-250: 19% 251-400: 6%  Interpretation: -Mostly acceptable blood sugar overnight in the morning and afternoon.  Occasional hyperglycemia with blood sugar up to 250-300 range at bedtime related with a bedtime snack and lunch and supper.  No hypoglycemia.  Hypoglycemia: Patient has no hypoglycemic episodes. Patient has hypoglycemia awareness.  Factors modifying glucose control: 1.  Diabetic diet assessment: 3 meals a day.  2.  Staying active or exercising: started to walk.  3.  Medication compliance: compliant all of the time.  # Primary hypothyroidism currently on thyroid hormone replacement/levothyroxine 25 mcg daily.  Interval history 04/03/23 Mostly acceptable blood sugar.  Occasional hyperglycemia at bedtime.  CGM data as reviewed above.  She reports she has started walking.  No other complaints today.  REVIEW OF SYSTEMS As per history of present illness.   PAST MEDICAL HISTORY: Past Medical History:  Diagnosis Date   Colon polyps    Depression 05/10/2013   Diabetes mellitus without complication (HCC)    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HCC)    Diverticulosis    Headache    Hypertension    Irritable bowel    Kidney disease, chronic, stage II (GFR 60-89 ml/min)    Neuropathy  Neuropathy associated with endocrine disorder (HCC)    Obesity (BMI 30-39.9)    Osteopenia    Visual disturbance     PAST SURGICAL HISTORY: Past Surgical History:  Procedure Laterality Date   arm surgery Right    plantar faci      ALLERGIES: Allergies  Allergen Reactions   Bisoprolol-Hydrochlorothiazide Swelling and Other (See Comments)    Face and throat became swollen; wheezing also   Relafen [Nabumetone] Hives and Shortness Of Breath   Aspirin Other (See Comments)    Irritates patient's IBS and Diverticulitis   Dulaglutide Other (See Comments)     Trulicity: Abdominal pain   Other Other (See Comments)    Seasonal allergies: Runny nose/eyes and congestion   Restasis [Cyclosporine] Itching, Rash and Other (See Comments)    Eyes burn and rash develops around the eyes    FAMILY HISTORY:  Family History  Problem Relation Age of Onset   Diabetes type II Mother    Stroke Mother    Hypertension Mother    Heart disease Father    Hypertension Father    Diabetes Father    Heart attack Father    Heart disease Brother    Hypertension Brother    Diabetes Brother    Kidney disease Brother    Diabetes Sister    Asthma Maternal Grandmother    Hypertension Maternal Grandmother    Heart attack Maternal Grandmother    Heart attack Maternal Grandfather    Heart attack Paternal Grandmother    Diabetes Paternal Grandfather    Heart attack Paternal Grandfather    Diabetes Brother    Heart attack Brother    Diabetes Brother    Diabetes Brother    Other Brother        Bright's Disease   Leukemia Other        brother's grandson   Breast cancer Neg Hx     SOCIAL HISTORY: Social History   Socioeconomic History   Marital status: Married    Spouse name: Not on file   Number of children: 3   Years of education: 12   Highest education level: High school graduate  Occupational History   Occupation: self-employed - sews  Tobacco Use   Smoking status: Never   Smokeless tobacco: Never  Vaping Use   Vaping status: Never Used  Substance and Sexual Activity   Alcohol use: No   Drug use: No   Sexual activity: Yes    Birth control/protection: Post-menopausal  Other Topics Concern   Not on file  Social History Narrative   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 Married for 38 years. Husband is a Teaching laboratory technician.   Right-handed.   2 cups caffeine per day.   Social Determinants of Health   Financial Resource Strain: Low Risk  (01/29/2022)   [facility] to [facility] visits prior to 09/15/2022.   Overall Financial Resource Strain (CARDIA)    Difficulty of Paying Living Expenses: Not hard at all  Food Insecurity: Low Risk  (02/19/2023)   [facility]   Hunger Vital Sign    Worried About Running Out of Food in the Last Year: Never true    Ran Out of Food in the Last Year: Never true  Transportation Needs: Not on file (02/19/2023)  Physical Activity: Insufficiently Active (01/29/2022)   [facility] to  09/15/2022[facility] visits prior to 09/15/2022.   Exercise Vital Sign    Days of Exercise per Week: 3 days    Minutes of Exercise per Session: 30 min  Stress: No Stress Concern Present (01/29/2022)   [facility] prior to 09/15/2022., [facility] visits prior to 09/15/2022.   [employer] of Occupational Health - Occupational Stress Questionnaire    Feeling of Stress : Not at all  Social Connections: Moderately Isolated (01/29/2022)   [facility] prior to 09/15/2022[facility] visits prior to 09/15/2022.   Social Advertising account executive [NHANES]    Frequency of Communication with Friends and Family: More than three times a week    Frequency of Social Gatherings with Friends and Family: Once a week    Attends Religious Services: Never    Database administrator or Organizations: No    Attends Engineer, structural: Never    Marital Status: Married    MEDICATIONS:  Current Outpatient Medications  Medication Sig Dispense Refill   acetaminophen (TYLENOL) 650 MG CR tablet Take 650-1,300 mg by mouth every 8 (eight) hours as needed (for pain or headaches).      ASPIRIN 81 PO Take 1 tablet by mouth  daily.     atorvastatin (LIPITOR) 20 MG tablet Take 1 tablet (20 mg total) by mouth daily. 30 tablet 0   BIOTIN PO biotin     Blood Glucose Monitoring Suppl (ONETOUCH VERIO FLEX SYSTEM) w/Device KIT Use OneTouch Verio Flex to check blood sugar twice daily. DX: E11.49 1 kit 0   butalbital-acetaminophen-caffeine (FIORICET, ESGIC) 50-325-40 MG tablet Take 1 tablet by mouth every 6 (six) hours as needed for headache. Do not refill in less than 30 days. 15 tablet 5   Continuous Glucose Sensor (FREESTYLE LIBRE 3 SENSOR) MISC APPLY 1 SENSOR ON UPPER ARM EVERY 14 DAYS FOR CONTINUOUS GLUCOSE MONITORING 2 each 5   FARXIGA 10 MG TABS tablet TAKE 1 TABLET BY MOUTH EVERY DAY 30 tablet 1   FOLIC ACID PO Folic acid     gabapentin (NEURONTIN) 600 MG tablet TAKE 1 TABLET 3 TIMES PER DAY AS NEEDED 90 tablet 3   glucose blood (ONETOUCH VERIO) test strip Use OneTouch Verio test strips to test blood sugars twice daily. DX: E11.49 100 each 1   insulin degludec (TRESIBA FLEXTOUCH) 100 UNIT/ML FlexTouch Pen INJECT 30 UNITS UNDER THE SKIN ONCE DAILY. 15 mL 3   insulin lispro (HUMALOG KWIKPEN) 100 UNIT/ML KwikPen INJECT 8 UNITS SUBCUTANEOUSLY BEFORE MEALS 15 mL 1   levothyroxine (SYNTHROID) 25 MCG tablet Take 25 mcg by mouth daily before breakfast.     metFORMIN (GLUCOPHAGE-XR) 500 MG 24 hr tablet TAKE 2 TABLETS BY MOUTH TWICE A DAY 360 tablet 1   methotrexate (RHEUMATREX) 2.5 MG tablet Take 2.5 mg by mouth once a week. TAKE 10 TABLETS BY MOUTH AT ONE TIME EVERY Monday.     OneTouch Delica Lancets 30G MISC Use OneTouch Delica Lancets to test blood sugars twice daily. DX: E11.49 100 each 1   PROAIR HFA 108 (90 Base) MCG/ACT inhaler Inhale 2 puffs into the lungs every 6 (six) hours as needed.  0   Semaglutide, 2 MG/DOSE, (OZEMPIC, 2 MG/DOSE,) 8 MG/3ML SOPN INJECT 2 MG ONCE WEEKLY 9 mL 1   sertraline (ZOLOFT) 100  MG tablet Take 200 mg by mouth daily. Take 2 tablets by mouth once daily.     albuterol (PROVENTIL HFA;VENTOLIN  HFA) 108 (90 Base) MCG/ACT inhaler Inhale into the lungs.     Insulin Pen Needle 32G X 4 MM MISC Use one per day to inject victoza 30 each 5   methocarbamol (ROBAXIN) 500 MG tablet Take 1 tablet by mouth daily. (Patient not taking: Reported on 12/21/2022)     nitroGLYCERIN (NITROSTAT) 0.4 MG SL tablet Place 1 tablet (0.4 mg total) under the tongue every 5 (five) minutes as needed for chest pain. 30 tablet 12   tirzepatide (MOUNJARO) 5 MG/0.5ML Pen Inject 5 mg into the skin once a week. 2 mL 0   No current facility-administered medications for this visit.    PHYSICAL EXAM: Vitals:   04/03/23 1308  BP: 130/80  Pulse: 74  Resp: 18  SpO2: 98%  Weight: 201 lb 9.6 oz (91.4 kg)  Height: 5' 6.5" (1.689 m)   Body mass index is 32.05 kg/m.  Wt Readings from Last 3 Encounters:  04/03/23 201 lb 9.6 oz (91.4 kg)  12/21/22 201 lb (91.2 kg)  08/22/22 206 lb 6.4 oz (93.6 kg)    General: Well developed, well nourished female in no apparent distress.  HEENT: AT/[place], no external lesions.  Eyes: Conjunctiva clear and no icterus. Neck: Neck supple  Lungs: Respirations not labored Neurologic: Alert, oriented, normal speech Extremities / Skin: Dry. No sores or rashes noted.  Psychiatric: Does not appear depressed or anxious  Diabetic Foot Exam - Simple   No data filed    LABS Reviewed Lab Results  Component Value Date   HGBA1C 7.2 (A) 04/03/2023   HGBA1C 7.4 (A) 12/21/2022   HGBA1C 7.2 (A) 08/22/2022   No results found for: "FRUCTOSAMINE" Lab Results  Component Value Date   CHOL 162 09/13/2020   HDL 73.20 09/13/2020   LDLCALC 71 09/13/2020   LDLDIRECT 58.0 12/25/2021   TRIG 91.0 09/13/2020   CHOLHDL 2 09/13/2020   Lab Results  Component Value Date   MICRALBCREAT 0.8 12/21/2022   MICRALBCREAT 2.1 12/25/2021   Lab Results  Component Value Date   CREATININE 0.83 12/21/2022   Lab Results  Component Value Date   GFR 73.72 12/21/2022    ASSESSMENT / PLAN  1. Type 2 diabetes  mellitus with diabetic neuropathy, without long-term current use of insulin (HCC)     Diabetes Mellitus type 2, complicated by no known complications. - Diabetic status / severity: Fair control.  Lab Results  Component Value Date   HGBA1C 7.2 (A) 04/03/2023    - Hemoglobin A1c goal : <7%  - Medications:  -Continue Tresiba 32 units daily. -Continue Humalog 10 - 14 units with meals three times a day.  Take 3 to 5 units of Humalog for bedtime snacks when eating. -Continue Ozempic 2 mg weekly. -Continue Farxiga 10 mg daily. -Continue metformin ER 1000 mg daily.   - Home glucose testing: Freestyle libre 3 CGM and check as needed. - Discussed/ Gave Hypoglycemia treatment plan.  # Consult : not required at this time.   # Annual urine for microalbuminuria/ creatinine ratio, no microalbuminuria currently. Last  Lab Results  Component Value Date   MICRALBCREAT 0.8 12/21/2022    # Foot check nightly.  # Annual dilated diabetic eye exams.   - Diet: Make healthy diabetic food choices - Life style / activity / exercise: Discussed.  2. Blood pressure  -  BP Readings from Last  1 Encounters:  04/03/23 130/80    - Control is in target.  - No change in current plans.  3. Lipid status / Hyperlipidemia - Last  Lab Results  Component Value Date   LDLCALC 71 09/13/2020   - Continue atorvastatin 20 mg daily.  Managed by primary care provider.  # Primary hypothyroidism -Continue levothyroxine 25 mcg daily. -Annual thyroid lab.  Diagnoses and all orders for this visit:  Type 2 diabetes mellitus with diabetic neuropathy, without long-term current use of insulin (HCC) -     POCT glycosylated hemoglobin (Hb A1C)    DISPOSITION Follow up in clinic in 3 months suggested.   All questions answered and patient verbalized understanding of the plan.  Jamie Lynnel Zanetti, MD [facility] [address] [place], [address] [place], [place] [address] Phone #  [phone]  At least part of this note was generated using voice recognition software. Inadvertent word errors may have occurred, which were not recognized during the proofreading process.

## 2023-04-03 NOTE — Patient Instructions (Signed)
Continue same medications for diabetes, and additional humalog 3-5 units with bedtime snack when eating.

## 2023-04-04 ENCOUNTER — Encounter: Payer: Self-pay | Admitting: Endocrinology

## 2023-07-08 ENCOUNTER — Ambulatory Visit (INDEPENDENT_AMBULATORY_CARE_PROVIDER_SITE_OTHER): Payer: 59 | Admitting: Endocrinology

## 2023-07-08 ENCOUNTER — Encounter: Payer: Self-pay | Admitting: Endocrinology

## 2023-07-08 VITALS — BP 122/80 | HR 65 | Resp 20 | Ht 66.5 in | Wt 204.0 lb

## 2023-07-08 DIAGNOSIS — Z794 Long term (current) use of insulin: Secondary | ICD-10-CM

## 2023-07-08 DIAGNOSIS — E1140 Type 2 diabetes mellitus with diabetic neuropathy, unspecified: Secondary | ICD-10-CM | POA: Diagnosis not present

## 2023-07-08 DIAGNOSIS — E1165 Type 2 diabetes mellitus with hyperglycemia: Secondary | ICD-10-CM

## 2023-07-08 LAB — POCT GLYCOSYLATED HEMOGLOBIN (HGB A1C): Hemoglobin A1C: 7.2 % — AB (ref 4.0–5.6)

## 2023-07-08 MED ORDER — INSULIN LISPRO (1 UNIT DIAL) 100 UNIT/ML (KWIKPEN): PEN_INJECTOR | SUBCUTANEOUS | 4 refills | Status: DC

## 2023-07-08 MED ORDER — DAPAGLIFLOZIN PROPANEDIOL 10 MG PO TABS: 10.0000 mg | ORAL_TABLET | Freq: Every day | ORAL | 3 refills | Status: DC

## 2023-07-08 MED ORDER — TRESIBA FLEXTOUCH 100 UNIT/ML ~~LOC~~ SOPN: PEN_INJECTOR | SUBCUTANEOUS | 3 refills | Status: DC

## 2023-07-08 MED ORDER — METFORMIN HCL ER 500 MG PO TB24: ORAL_TABLET | ORAL | 3 refills | Status: DC

## 2023-07-08 MED ORDER — LEVOTHYROXINE SODIUM 25 MCG PO TABS: 25.0000 ug | ORAL_TABLET | Freq: Every day | ORAL | 3 refills | Status: DC

## 2023-07-08 NOTE — Progress Notes (Signed)
Outpatient Endocrinology Note [place] Marlane Hirschmann, MD  07/08/23  Patient's Name: [name]    DOB: Jun 07, 1956    MRN: [id number]                                                    REASON OF VISIT: Follow up for type 2 diabetes mellitus  PCP: Barbie Banner, MD  HISTORY OF PRESENT ILLNESS:   [name] is a 67 y.o. old female with past medical history listed below, is here for follow up of type 2 diabetes mellitus.   Pertinent Diabetes History: Patient was diagnosed with type 2 diabetes mellitus in 2010.  She has symptoms of neuropathic pain on the onset.  She was initially treated with metformin.  At some point she had taken [facility] as well.  She had been on Trulicity which was later changed to Victoza because of diarrhea and abdominal pain.  She had been on Invokana in the past as well.  She has been on insulin therapy since June 2018.  She has controlled type 2 diabetes mellitus.  Chronic Diabetes Complications : Retinopathy: no. Last ophthalmology exam was done on annually reportedly. Nephropathy: no, Peripheral neuropathy: yes, on gabapentin.  Coronary artery disease: no Stroke: no  Relevant comorbidities and cardiovascular risk factors: Obesity: yes Body mass index is 32.43 kg/m.  Hypertension: yes Hyperlipidemia. Yes, on statin  Current / Home Diabetic regimen includes: Tresiba 32 units daily. Humalog 8 - 16 units with meals three times a day. Ozempic 2 mg weekly. Farxiga 10 mg daily. Metformin ER 1000 mg daily.   Prior diabetic medications:  Glycemic data:   Diarrhea with higher dose of metformin.  CONTINUOUS GLUCOSE MONITORING SYSTEM (CGMS) INTERPRETATION: At today's visit, we reviewed CGM downloads. The full report is scanned in the media. Reviewing the CGM trends, blood glucose are as follows:  FreeStyle Libre 3 CGM-  Sensor Download (Sensor download was reviewed and summarized below.) Dates: December 10 to July 08, 2023 for 14 days Sensor Average:  168 Glucose Management Indicator: 7.3%    Interpretation: Mostly acceptable blood sugar with occasional hyperglycemia after blood sugar 300s and rare hypoglycemia in the early morning with blood sugar in 60s related with his skipping meals.  No concerning hypoglycemia.  Hypoglycemia: Patient has minor hypoglycemic episodes. Patient has hypoglycemia awareness.  Factors modifying glucose control: 1.  Diabetic diet assessment: 3 meals a day.  2.  Staying active or exercising: started to walk.  3.  Medication compliance: compliant all of the time.  # Primary hypothyroidism currently on thyroid hormone replacement/levothyroxine 25 mcg daily.  Interval history  CGM data as reviewed above.  Diabetes regimen as noted above.  Patient had a stress times in last 2 months, her young son died in 07-01-2023.  She denies vision problem.  Numbness of the feet is controlled with taking gabapentin.  No other complaints today.  REVIEW OF SYSTEMS As per history of present illness.   PAST MEDICAL HISTORY: Past Medical History:  Diagnosis Date   Colon polyps    Depression 05/10/2013   Diabetes mellitus without complication (HCC)    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HCC)    Diverticulosis    Headache    Hypertension    Irritable bowel    Kidney disease, chronic, stage II (GFR 60-89 ml/min)    Neuropathy  Neuropathy associated with endocrine disorder (HCC)    Obesity (BMI 30-39.9)    Osteopenia    Visual disturbance     PAST SURGICAL HISTORY: Past Surgical History:  Procedure Laterality Date   arm surgery Right    plantar faci      ALLERGIES: Allergies  Allergen Reactions   Bisoprolol-Hydrochlorothiazide Swelling and Other (See Comments)    Face and throat became swollen; wheezing also   Relafen [Nabumetone] Hives and Shortness Of Breath   Aspirin Other (See Comments)    Irritates patient's IBS and Diverticulitis   Dulaglutide Other (See Comments)    Trulicity: Abdominal pain   Other  Other (See Comments)    Seasonal allergies: Runny nose/eyes and congestion   Restasis [Cyclosporine] Itching, Rash and Other (See Comments)    Eyes burn and rash develops around the eyes    FAMILY HISTORY:  Family History  Problem Relation Age of Onset   Diabetes type II Mother    Stroke Mother    Hypertension Mother    Heart disease Father    Hypertension Father    Diabetes Father    Heart attack Father    Heart disease Brother    Hypertension Brother    Diabetes Brother    Kidney disease Brother    Diabetes Sister    Asthma Maternal Grandmother    Hypertension Maternal Grandmother    Heart attack Maternal Grandmother    Heart attack Maternal Grandfather    Heart attack Paternal Grandmother    Diabetes Paternal Grandfather    Heart attack Paternal Grandfather    Diabetes Brother    Heart attack Brother    Diabetes Brother    Diabetes Brother    Other Brother        Bright's Disease   Leukemia Other        brother's grandson   Breast cancer Neg Hx     SOCIAL HISTORY: Social History   Socioeconomic History   Marital status: Married    Spouse name: Not on file   Number of children: 3   Years of education: 12   Highest education level: High school graduate  Occupational History   Occupation: self-employed - sews  Tobacco Use   Smoking status: Never   Smokeless tobacco: Never  Vaping Use   Vaping status: Never Used  Substance and Sexual Activity   Alcohol use: No   Drug use: No   Sexual activity: Yes    Birth control/protection: Post-menopausal  Other Topics Concern   Not on file  Social History Narrative   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 Married for 38 years. Husband is a Teaching laboratory technician.   Right-handed.   2 cups caffeine per day.   Social Drivers of Health   Financial Resource Strain: Low Risk  (01/29/2022)   [facility]  [facility] visits prior to 09/15/2022.   Overall Financial Resource Strain (CARDIA)    Difficulty of Paying Living Expenses: Not hard at all  Food Insecurity: Low Risk  (02/19/2023)   [facility]   Hunger Vital Sign    Worried About Running Out of Food in the Last Year: Never true    Ran Out of Food in the Last Year: Never true  Transportation Needs: Not on file (02/19/2023)  Physical Activity: Insufficiently Active (01/29/2022)   [facility] to  09/15/2022[facility] prior to 09/15/2022.   Exercise Vital Sign    Days of Exercise per Week: 3 days    Minutes of Exercise per Session: 30 min  Stress: No Stress Concern Present (01/29/2022)   [facility] prior to 09/15/2022., [facility] visits prior to 09/15/2022.   [employer] of Occupational Health - Occupational Stress Questionnaire    Feeling of Stress : Not at all  Social Connections: Moderately Isolated (01/29/2022)   [facility] prior to 09/15/2022[facility] visits prior to 09/15/2022.   Social Advertising account executive [NHANES]    Frequency of Communication with Friends and Family: More than three times a week    Frequency of Social Gatherings with Friends and Family: Once a week    Attends Religious Services: Never    Database administrator or Organizations: No    Attends Engineer, structural: Never    Marital Status: Married    MEDICATIONS:  Current Outpatient Medications  Medication Sig Dispense Refill   acetaminophen (TYLENOL) 650 MG CR tablet Take 650-1,300 mg by mouth every 8 (eight) hours as needed (for pain or headaches).      ASPIRIN 81 PO Take 1 tablet by mouth daily.     atorvastatin (LIPITOR) 20 MG  tablet Take 1 tablet (20 mg total) by mouth daily. 30 tablet 0   BIOTIN PO biotin     Blood Glucose Monitoring Suppl (ONETOUCH VERIO FLEX SYSTEM) w/Device KIT Use OneTouch Verio Flex to check blood sugar twice daily. DX: E11.49 1 kit 0   butalbital-acetaminophen-caffeine (FIORICET, ESGIC) 50-325-40 MG tablet Take 1 tablet by mouth every 6 (six) hours as needed for headache. Do not refill in less than 30 days. 15 tablet 5   Continuous Glucose Sensor (FREESTYLE LIBRE 3 PLUS SENSOR) MISC 1 each by Does not apply route continuous. Apply every 15 days 6 each 2   Continuous Glucose Sensor (FREESTYLE LIBRE 3 SENSOR) MISC APPLY 1 SENSOR ON UPPER ARM EVERY 14 DAYS FOR CONTINUOUS GLUCOSE MONITORING 2 each 5   FOLIC ACID PO Folic acid     gabapentin (NEURONTIN) 600 MG tablet TAKE 1 TABLET 3 TIMES PER DAY AS NEEDED 90 tablet 3   glucose blood (ONETOUCH VERIO) test strip Use OneTouch Verio test strips to test blood sugars twice daily. DX: E11.49 100 each 1   methotrexate (RHEUMATREX) 2.5 MG tablet Take 2.5 mg by mouth once a week. TAKE 10 TABLETS BY MOUTH AT ONE TIME EVERY Monday.     OneTouch Delica Lancets 30G MISC Use OneTouch Delica Lancets to test blood sugars twice daily. DX: E11.49 100 each 1   PROAIR HFA 108 (90 Base) MCG/ACT inhaler Inhale 2 puffs into the lungs every 6 (six) hours as needed.  0   Semaglutide, 2 MG/DOSE, (OZEMPIC, 2 MG/DOSE,) 8 MG/3ML SOPN INJECT 2 MG ONCE WEEKLY 9 mL 1   sertraline (ZOLOFT) 100 MG tablet Take 200 mg by mouth daily. Take 2 tablets by mouth once daily.     albuterol (PROVENTIL HFA;VENTOLIN HFA) 108 (90 Base) MCG/ACT inhaler Inhale into the lungs.     dapagliflozin propanediol (FARXIGA) 10 MG TABS tablet Take 1 tablet (10 mg total) by mouth daily. 90 tablet 3   insulin degludec (TRESIBA FLEXTOUCH) 100 UNIT/ML FlexTouch Pen Inject 32  units under the skin once daily. 15 mL 3   insulin lispro (HUMALOG KWIKPEN) 100 UNIT/ML KwikPen INJECT 8 - 16 UNITS SUBCUTANEOUSLY BEFORE  MEALS, maximum 40 units/day. 30 mL 4   Insulin Pen Needle 32G X 4 MM MISC Use one per day to inject victoza 30 each 5   levothyroxine (SYNTHROID) 25 MCG tablet Take 1 tablet (25 mcg total) by mouth daily before breakfast. 90 tablet 3   metFORMIN (GLUCOPHAGE-XR) 500 MG 24 hr tablet TAKE 2 TABLETS BY MOUTH TWICE A DAY 360 tablet 3   methocarbamol (ROBAXIN) 500 MG tablet Take 1 tablet by mouth daily. (Patient not taking: Reported on 12/21/2022)     nitroGLYCERIN (NITROSTAT) 0.4 MG SL tablet Place 1 tablet (0.4 mg total) under the tongue every 5 (five) minutes as needed for chest pain. 30 tablet 12   No current facility-administered medications for this visit.    PHYSICAL EXAM: Vitals:   07/08/23 1410  BP: 122/80  Pulse: 65  Resp: 20  SpO2: 98%  Weight: 204 lb (92.5 kg)  Height: 5' 6.5" (1.689 m)    Body mass index is 32.43 kg/m.  Wt Readings from Last 3 Encounters:  07/08/23 204 lb (92.5 kg)  04/03/23 201 lb 9.6 oz (91.4 kg)  12/21/22 201 lb (91.2 kg)    General: Well developed, well nourished female in no apparent distress.  HEENT: AT/Evan, no external lesions.  Eyes: Conjunctiva clear and no icterus. Neck: Neck supple  Lungs: Respirations not labored Neurologic: Alert, oriented, normal speech Extremities / Skin: Dry.  Psychiatric: Does not appear depressed or anxious  Diabetic Foot Exam - Simple   No data filed    LABS Reviewed Lab Results  Component Value Date   HGBA1C 7.2 (A) 07/08/2023   HGBA1C 7.2 (A) 04/03/2023   HGBA1C 7.4 (A) 12/21/2022   No results found for: "FRUCTOSAMINE" Lab Results  Component Value Date   CHOL 162 09/13/2020   HDL 73.20 09/13/2020   LDLCALC 71 09/13/2020   LDLDIRECT 58.0 12/25/2021   TRIG 91.0 09/13/2020   CHOLHDL 2 09/13/2020   Lab Results  Component Value Date   MICRALBCREAT 0.8 12/21/2022   MICRALBCREAT 2.1 12/25/2021   Lab Results  Component Value Date   CREATININE 0.83 12/21/2022   Lab Results  Component Value Date    GFR 73.72 12/21/2022    ASSESSMENT / PLAN  1. Type 2 diabetes mellitus with diabetic neuropathy, with long-term current use of insulin (HCC)   2. Uncontrolled type 2 diabetes mellitus with hyperglycemia, with long-term current use of insulin (HCC)      Diabetes Mellitus type 2, complicated by diabetic neuropathy. - Diabetic status / severity: Fair control.  Lab Results  Component Value Date   HGBA1C 7.2 (A) 07/08/2023    - Hemoglobin A1c goal : <7%  - Medications: No change. -Continue Tresiba 32 units daily. -Continue Humalog 8-16 units with meals three times a day.  Take 3 to 5 units of Humalog for bedtime snacks when eating. -Continue Ozempic 2 mg weekly. -Continue Farxiga 10 mg daily. -Continue metformin ER 1000 mg daily.   - Home glucose testing: Freestyle libre 3 CGM and check as needed. - Discussed/ Gave Hypoglycemia treatment plan.  # Consult : not required at this time.   # Annual urine for microalbuminuria/ creatinine ratio, no microalbuminuria currently. Last  Lab Results  Component Value Date   MICRALBCREAT 0.8 12/21/2022    # Foot check nightly.  # Annual dilated diabetic eye exams.   -  Diet: Make healthy diabetic food choices - Life style / activity / exercise: Discussed.  2. Blood pressure  -  BP Readings from Last 1 Encounters:  07/08/23 122/80    - Control is in target.  - No change in current plans.  3. Lipid status / Hyperlipidemia - Last  Lab Results  Component Value Date   LDLCALC 71 09/13/2020   - Continue atorvastatin 20 mg daily.  Managed by primary care provider.  # Primary hypothyroidism -Continue levothyroxine 25 mcg daily. -Annual thyroid lab.  Will check lab in next follow-up visit.  Diagnoses and all orders for this visit:  Type 2 diabetes mellitus with diabetic neuropathy, with long-term current use of insulin (HCC) -     POCT glycosylated hemoglobin (Hb A1C)  Uncontrolled type 2 diabetes mellitus with hyperglycemia,  with long-term current use of insulin (HCC) -     dapagliflozin propanediol (FARXIGA) 10 MG TABS tablet; Take 1 tablet (10 mg total) by mouth daily.  Other orders -     levothyroxine (SYNTHROID) 25 MCG tablet; Take 1 tablet (25 mcg total) by mouth daily before breakfast. -     metFORMIN (GLUCOPHAGE-XR) 500 MG 24 hr tablet; TAKE 2 TABLETS BY MOUTH TWICE A DAY -     insulin lispro (HUMALOG KWIKPEN) 100 UNIT/ML KwikPen; INJECT 8 - 16 UNITS SUBCUTANEOUSLY BEFORE MEALS, maximum 40 units/day. -     insulin degludec (TRESIBA FLEXTOUCH) 100 UNIT/ML FlexTouch Pen; Inject 32 units under the skin once daily.     DISPOSITION Follow up in clinic in 4 months suggested.  Lab on the same day of the visit.   All questions answered and patient verbalized understanding of the plan.  [place] Vladislav Axelson, MD [facility] Endocrinology [facility] [address] [place], [address] [place], [place] [address] Phone # [phone]  At least part of this note was generated using voice recognition software. Inadvertent word errors may have occurred, which were not recognized during the proofreading process.

## 2023-11-06 ENCOUNTER — Encounter: Payer: Self-pay | Admitting: Endocrinology

## 2023-11-06 ENCOUNTER — Ambulatory Visit (INDEPENDENT_AMBULATORY_CARE_PROVIDER_SITE_OTHER): Payer: 59 | Admitting: Endocrinology

## 2023-11-06 VITALS — BP 128/70 | HR 63 | Resp 20 | Ht 66.5 in | Wt 200.4 lb

## 2023-11-06 DIAGNOSIS — Z794 Long term (current) use of insulin: Secondary | ICD-10-CM

## 2023-11-06 DIAGNOSIS — E038 Other specified hypothyroidism: Secondary | ICD-10-CM | POA: Diagnosis not present

## 2023-11-06 DIAGNOSIS — E1140 Type 2 diabetes mellitus with diabetic neuropathy, unspecified: Secondary | ICD-10-CM | POA: Diagnosis not present

## 2023-11-06 LAB — POCT GLYCOSYLATED HEMOGLOBIN (HGB A1C): Hemoglobin A1C: 6.9 % — AB (ref 4.0–5.6)

## 2023-11-06 NOTE — Progress Notes (Signed)
 Outpatient Endocrinology Note Jamie Tywaun Hiltner, MD  11/06/23  Patient's Name: [name]    DOB: 1956-05-17    MRN: [id number]                                                    REASON OF VISIT: Follow up for type 2 diabetes mellitus  PCP: Tura Gaines, MD  HISTORY OF PRESENT ILLNESS:   [name] is a 68 y.o. old female with past medical history listed below, is here for follow up of type 2 diabetes mellitus.   Pertinent Diabetes History: Patient was diagnosed with type 2 diabetes mellitus in 2010.  She has symptoms of neuropathic pain on the onset.  She was initially treated with metformin .  At some point she had taken Onlyza as well.  She had been on Trulicity  which was later changed to Victoza  because of diarrhea and abdominal pain.  She had been on Invokana  in the past as well.  She has been on insulin  therapy since June 2018.  She has relatively controlled type 2 diabetes mellitus.  Chronic Diabetes Complications : Retinopathy: no. Last ophthalmology exam was done on annually reportedly. Nephropathy: no, Peripheral neuropathy: yes, on gabapentin .  Coronary artery disease: no Stroke: no  Relevant comorbidities and cardiovascular risk factors: Obesity: yes Body mass index is 31.86 kg/m.  Hypertension: yes Hyperlipidemia. Yes, on statin  Current / Home Diabetic regimen includes: Tresiba  32 units daily. Humalog  8 nits with meals three times a day. Ozempic  2 mg weekly. Farxiga  10 mg daily. Metformin  ER 1000 mg daily.   Prior diabetic medications:  Glycemic data:   Diarrhea with higher dose of metformin .  CONTINUOUS GLUCOSE MONITORING SYSTEM (CGMS) INTERPRETATION: At today's visit, we reviewed CGM downloads. The full report is scanned in the media. Reviewing the CGM trends, blood glucose are as follows:  FreeStyle Libre 3 CGM-  Sensor Download (Sensor download was reviewed and summarized below.) Dates: Abrol 10 to  November 06, 2023 for 14 days Sensor Average:  208 Glucose Management Indicator: 8.3%    Interpretation: Frequent hyperglycemia with blood sugar 350-400 range especially with evening meal/bedtime eating, sometimes with lunch.  Occasionally low blood sugar in the early morning in 60s.  Some of the days acceptable blood sugar.  No concerning hypoglycemia.  Hypoglycemia: Patient has minor hypoglycemic episodes. Patient has hypoglycemia awareness.  Factors modifying glucose control: 1.  Diabetic diet assessment: 3 meals a day.  2.  Staying active or exercising: started to walk.  3.  Medication compliance: compliant all of the time.  # Primary hypothyroidism currently on thyroid  hormone replacement/levothyroxine  25 mcg daily.  Interval history  Patient stated still on stressful days due to death of her young son in 06-28-2024.  She reports not fully compliant with taking Humalog  causing hyperglycemia.  Diabetes regimen as reviewed and noted above.  CGM data as reviewed  above.  She has no other complaints today.  Hemoglobin A1c 6.9% today.  GMI on CGM 8.3%.  REVIEW OF SYSTEMS As per history of present illness.   PAST MEDICAL HISTORY: Past Medical History:  Diagnosis Date   Colon polyps    Depression 05/10/2013   Diabetes mellitus without complication (HCC)    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HCC)    Diverticulosis    Headache    Hypertension    Irritable  bowel    Kidney disease, chronic, stage II (GFR 60-89 ml/min)    Neuropathy    Neuropathy associated with endocrine disorder (HCC)    Obesity (BMI 30-39.9)    Osteopenia    Visual disturbance     PAST SURGICAL HISTORY: Past Surgical History:  Procedure Laterality Date   arm surgery Right    plantar faci      ALLERGIES: Allergies  Allergen Reactions   Bisoprolol-Hydrochlorothiazide Swelling and Other (See Comments)    Face and throat became swollen; wheezing also   Relafen [Nabumetone] Hives and Shortness Of Breath   Aspirin  Other (See Comments)    Irritates  patient's IBS and Diverticulitis   Dulaglutide  Other (See Comments)    Trulicity : Abdominal pain   Other Other (See Comments)    Seasonal allergies: Runny nose/eyes and congestion   Restasis [Cyclosporine] Itching, Rash and Other (See Comments)    Eyes burn and rash develops around the eyes    FAMILY HISTORY:  Family History  Problem Relation Age of Onset   Diabetes type II Mother    Stroke Mother    Hypertension Mother    Heart disease Father    Hypertension Father    Diabetes Father    Heart attack Father    Heart disease Brother    Hypertension Brother    Diabetes Brother    Kidney disease Brother    Diabetes Sister    Asthma Maternal Grandmother    Hypertension Maternal Grandmother    Heart attack Maternal Grandmother    Heart attack Maternal Grandfather    Heart attack Paternal Grandmother    Diabetes Paternal Grandfather    Heart attack Paternal Grandfather    Diabetes Brother    Heart attack Brother    Diabetes Brother    Diabetes Brother    Other Brother        Bright's Disease   Leukemia Other        brother's grandson   Breast cancer Neg Hx     SOCIAL HISTORY: Social History   Socioeconomic History   Marital status: Married    Spouse name: Not on file   Number of children: 3   Years of education: 12   Highest education level: High school graduate  Occupational History   Occupation: self-employed - sews  Tobacco Use   Smoking status: Never   Smokeless tobacco: Never  Vaping Use   Vaping status: Never Used  Substance and Sexual Activity   Alcohol use: No   Drug use: No   Sexual activity: Yes    Birth control/protection: Post-menopausal  Other Topics Concern   Not on file  Social History Narrative   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 Married for 38 years. Husband is a Teaching laboratory technician.   Right-handed.   2 cups caffeine  per day.   Social Drivers of Health   Financial Resource Strain: Low Risk  (01/29/2022)   [facility]  [facility] prior to 09/15/2022.   Overall Financial Resource Strain (CARDIA)    Difficulty of Paying Living Expenses: Not hard at all  Food Insecurity: Low Risk  (02/19/2023)   [facility]   Hunger Vital Sign    Worried About Running Out of Food in the Last Year: Never true    Ran Out of Food in the Last Year: Never true  Transportation Needs: Not on file (  02/19/2023)  Physical Activity: Insufficiently Active (01/29/2022)   [facility] to 09/15/2022[facility] visits prior to 09/15/2022.   Exercise Vital Sign    Days of Exercise per Week: 3 days    Minutes of Exercise per Session: 30 min  Stress: No Stress Concern Present (01/29/2022)   [facility] to 09/15/2022[facility] visits prior to 09/15/2022.   [employer] of Occupational Health - Occupational Stress Questionnaire    Feeling of Stress : Not at all  Social Connections: Moderately Isolated (01/29/2022)   [facility] prior to 09/15/2022[facility] visits prior to 09/15/2022.   Social Advertising account executive [NHANES]    Frequency of Communication with Friends and Family: More than three times a week    Frequency of Social Gatherings with Friends and Family: Once a week    Attends Religious Services: Never    Database administrator or Organizations: No    Attends Engineer, structural: Never    Marital Status: Married    MEDICATIONS:  Current Outpatient Medications  Medication Sig Dispense Refill   acetaminophen  (TYLENOL ) 650 MG CR tablet Take 650-1,300 mg by mouth every 8 (eight) hours as needed  (for pain or headaches).      ASPIRIN  81 PO Take 1 tablet by mouth daily.     atorvastatin  (LIPITOR) 20 MG tablet Take 1 tablet (20 mg total) by mouth daily. 30 tablet 0   BIOTIN PO biotin     Blood Glucose Monitoring Suppl (ONETOUCH VERIO FLEX SYSTEM) w/Device KIT Use OneTouch Verio Flex to check blood sugar twice daily. DX: E11.49 1 kit 0   butalbital -acetaminophen -caffeine  (FIORICET, ESGIC) 50-325-40 MG tablet Take 1 tablet by mouth every 6 (six) hours as needed for headache. Do not refill in less than 30 days. 15 tablet 5   Continuous Glucose Sensor (FREESTYLE LIBRE 3 PLUS SENSOR) MISC 1 each by Does not apply route continuous. Apply every 15 days 6 each 2   dapagliflozin  propanediol (FARXIGA ) 10 MG TABS tablet Take 1 tablet (10 mg total) by mouth daily. 90 tablet 3   FOLIC ACID PO Folic acid     gabapentin  (NEURONTIN ) 600 MG tablet TAKE 1 TABLET 3 TIMES PER DAY AS NEEDED 90 tablet 3   glucose blood (ONETOUCH VERIO) test strip Use OneTouch Verio test strips to test blood sugars twice daily. DX: E11.49 100 each 1   insulin  degludec (TRESIBA  FLEXTOUCH) 100 UNIT/ML FlexTouch Pen Inject 32 units under the skin once daily. 15 mL 3   insulin  lispro (HUMALOG  KWIKPEN) 100 UNIT/ML KwikPen INJECT 8 - 16 UNITS SUBCUTANEOUSLY BEFORE MEALS, maximum 40 units/day. 30 mL 4   levothyroxine  (SYNTHROID ) 25 MCG tablet Take 1 tablet (25 mcg total) by mouth daily before breakfast. 90 tablet 3   metFORMIN  (GLUCOPHAGE -XR) 500 MG 24 hr tablet TAKE 2 TABLETS BY MOUTH TWICE A DAY 360 tablet 3   methotrexate (RHEUMATREX) 2.5 MG tablet Take 2.5 mg by mouth once a week. TAKE 10 TABLETS BY MOUTH AT ONE TIME EVERY Monday.     OneTouch Delica Lancets 30G MISC Use OneTouch Delica Lancets to test blood sugars twice daily. DX: E11.49 100 each 1   PROAIR HFA 108 (90 Base) MCG/ACT inhaler Inhale 2 puffs into the lungs  every 6 (six) hours as needed.  0   Semaglutide , 2 MG/DOSE, (OZEMPIC , 2 MG/DOSE,) 8 MG/3ML SOPN INJECT 2 MG ONCE  WEEKLY 9 mL 1   sertraline (ZOLOFT) 100 MG tablet Take 200 mg by mouth daily. Take 2 tablets by mouth once daily.     albuterol (PROVENTIL HFA;VENTOLIN HFA) 108 (90 Base) MCG/ACT inhaler Inhale into the lungs.     Continuous Glucose Sensor (FREESTYLE LIBRE 3 SENSOR) MISC APPLY 1 SENSOR ON UPPER ARM EVERY 14 DAYS FOR CONTINUOUS GLUCOSE MONITORING (Patient not taking: Reported on 11/06/2023) 2 each 5   Insulin  Pen Needle 32G X 4 MM MISC Use one per day to inject victoza  30 each 5   methocarbamol (ROBAXIN) 500 MG tablet Take 1 tablet by mouth daily. (Patient not taking: Reported on 12/21/2022)     nitroGLYCERIN  (NITROSTAT ) 0.4 MG SL tablet Place 1 tablet (0.4 mg total) under the tongue every 5 (five) minutes as needed for chest pain. 30 tablet 12   No current facility-administered medications for this visit.    PHYSICAL EXAM: Vitals:   11/06/23 1303  BP: 128/70  Pulse: 63  Resp: 20  SpO2: 97%  Weight: 200 lb 6.4 oz (90.9 kg)  Height: 5' 6.5" (1.689 m)     Body mass index is 31.86 kg/m.  Wt Readings from Last 3 Encounters:  11/06/23 200 lb 6.4 oz (90.9 kg)  07/08/23 204 lb (92.5 kg)  04/03/23 201 lb 9.6 oz (91.4 kg)    General: Well developed, well nourished female in no apparent distress.  HEENT: AT/[place], no external lesions.  Eyes: Conjunctiva clear and no icterus. Neck: Neck supple  Lungs: Respirations not labored Neurologic: Alert, oriented, normal speech Extremities / Skin: Dry.  Psychiatric: Does not appear depressed or anxious  Diabetic Foot Exam - Simple   No data filed    LABS Reviewed Lab Results  Component Value Date   HGBA1C 6.9 (A) 11/06/2023   HGBA1C 7.2 (A) 07/08/2023   HGBA1C 7.2 (A) 04/03/2023   No results found for: "FRUCTOSAMINE" Lab Results  Component Value Date   CHOL 162 09/13/2020   HDL 73.20 09/13/2020   LDLCALC 71 09/13/2020   LDLDIRECT 58.0 12/25/2021   TRIG 91.0 09/13/2020   CHOLHDL 2 09/13/2020   Lab Results  Component Value Date    MICRALBCREAT 0.8 12/21/2022   MICRALBCREAT 2.1 12/25/2021   Lab Results  Component Value Date   CREATININE 0.83 12/21/2022   Lab Results  Component Value Date   GFR 73.72 12/21/2022    ASSESSMENT / PLAN  1. Type 2 diabetes mellitus with diabetic neuropathy, with long-term current use of insulin  (HCC)   2. Subclinical hypothyroidism     Diabetes Mellitus type 2, complicated by diabetic neuropathy. - Diabetic status / severity: Fair control.  Lab Results  Component Value Date   HGBA1C 6.9 (A) 11/06/2023    - Hemoglobin A1c goal : <6.5%  Recently frequent hypoglycemia.  Discussed about taking Humalog  before eating for all the meals.  Discussed about adjusting dose of Humalog  based on meal size.  - Medications: Below.  -Continue Tresiba  32 units daily. - Adjust Humalog  8-16 units with meals three times a day.  Take 3 to 5 units of Humalog  for bedtime snacks when eating. -Continue Ozempic  2 mg weekly. -Continue Farxiga  10 mg daily. -Continue metformin  ER 1000 mg daily.   - Home glucose testing: Freestyle libre 3 CGM and check as needed. - Discussed/ Gave Hypoglycemia treatment plan.  # Consult :  not required at this time.   # Annual urine for microalbuminuria/ creatinine ratio, no microalbuminuria currently. Last  Lab Results  Component Value Date   MICRALBCREAT 0.8 12/21/2022    # Foot check nightly.  # Annual dilated diabetic eye exams.   - Diet: Make healthy diabetic food choices - Life style / activity / exercise: Discussed.  2. Blood pressure  -  BP Readings from Last 1 Encounters:  11/06/23 128/70    - Control is in target.  - No change in current plans.  3. Lipid status / Hyperlipidemia - Last  Lab Results  Component Value Date   LDLCALC 71 09/13/2020   - Continue atorvastatin  20 mg daily.  Managed by primary care provider.  # Primary hypothyroidism -Continue levothyroxine  25 mcg daily. -Annual thyroid  lab.    Diagnoses and all orders for  this visit:  Type 2 diabetes mellitus with diabetic neuropathy, with long-term current use of insulin  (HCC) -     POCT glycosylated hemoglobin (Hb A1C)  Subclinical hypothyroidism      DISPOSITION Follow up in clinic in 4 months suggested.  Lab on the same day of the visit.   All questions answered and patient verbalized understanding of the plan.  Jamie Ethelyn Cerniglia, MD [facility] Endocrinology [facility] [address] [place], [address] [place], [place] [address] Phone # [phone]  At least part of this note was generated using voice recognition software. Inadvertent word errors may have occurred, which were not recognized during the proofreading process.

## 2023-11-11 ENCOUNTER — Encounter: Payer: Self-pay | Admitting: Endocrinology

## 2023-11-15 ENCOUNTER — Other Ambulatory Visit: Payer: Self-pay | Admitting: Family Medicine

## 2023-11-15 DIAGNOSIS — Z0000 Encounter for general adult medical examination without abnormal findings: Secondary | ICD-10-CM

## 2023-11-22 ENCOUNTER — Encounter (HOSPITAL_COMMUNITY): Payer: Self-pay

## 2023-11-25 ENCOUNTER — Ambulatory Visit

## 2023-11-29 ENCOUNTER — Ambulatory Visit
Admission: RE | Admit: 2023-11-29 | Discharge: 2023-11-29 | Disposition: A | Discharge status: Home / Self Care | Source: Ambulatory Visit | Attending: Family Medicine | Admitting: Family Medicine

## 2023-11-29 DIAGNOSIS — Z0000 Encounter for general adult medical examination without abnormal findings: Secondary | ICD-10-CM

## 2024-02-14 ENCOUNTER — Other Ambulatory Visit: Payer: Self-pay | Admitting: Endocrinology

## 2024-02-27 DIAGNOSIS — K219 Gastro-esophageal reflux disease without esophagitis: Secondary | ICD-10-CM | POA: Insufficient documentation

## 2024-03-09 ENCOUNTER — Encounter: Payer: Self-pay | Admitting: Endocrinology

## 2024-03-09 ENCOUNTER — Ambulatory Visit (INDEPENDENT_AMBULATORY_CARE_PROVIDER_SITE_OTHER): Admitting: Endocrinology

## 2024-03-09 VITALS — BP 122/82 | HR 66 | Resp 20 | Ht 66.5 in | Wt 197.6 lb

## 2024-03-09 DIAGNOSIS — Z794 Long term (current) use of insulin: Secondary | ICD-10-CM

## 2024-03-09 DIAGNOSIS — E038 Other specified hypothyroidism: Secondary | ICD-10-CM | POA: Diagnosis not present

## 2024-03-09 DIAGNOSIS — E1140 Type 2 diabetes mellitus with diabetic neuropathy, unspecified: Secondary | ICD-10-CM | POA: Diagnosis not present

## 2024-03-09 NOTE — Progress Notes (Signed)
 Outpatient Endocrinology Note [place] Skippy Marhefka, MD  03/09/24  Patient's Name: [name]    DOB: May 25, 1956    MRN: [id number]                                                    REASON OF VISIT: Follow up for type 2 diabetes mellitus  PCP: Tanda Prentice DEL, MD  HISTORY OF PRESENT ILLNESS:   [name] is a 68 y.o. old female with past medical history listed below, is here for follow up of type 2 diabetes mellitus.   Pertinent Diabetes History: Patient was diagnosed with type 2 diabetes mellitus in 2010.  She has symptoms of neuropathic pain on the onset.  She was initially treated with metformin .  At some point she had taken Onlyza as well.  She had been on Trulicity  which was later changed to Victoza  because of diarrhea and abdominal pain.  She had been on Invokana  in the past as well.  She has been on insulin  therapy since June 2018.  She has relatively controlled type 2 diabetes mellitus.  Chronic Diabetes Complications : Retinopathy: no. Last ophthalmology exam was done on annually reportedly. Nephropathy: no, Peripheral neuropathy: yes, on gabapentin .  Coronary artery disease: no Stroke: no  Relevant comorbidities and cardiovascular risk factors: Obesity: yes Body mass index is 31.42 kg/m.  Hypertension: yes Hyperlipidemia. Yes, on statin  Current / Home Diabetic regimen includes: Tresiba  32 units daily. Humalog  8 - 16 units with meals three times a day. Ozempic  2 mg weekly. Farxiga  10 mg daily. Metformin  ER 1000 mg daily.   Prior diabetic medications:  Glycemic data:   Diarrhea with higher dose of metformin .  CONTINUOUS GLUCOSE MONITORING SYSTEM (CGMS) INTERPRETATION: At today's visit, we reviewed CGM downloads. The full report is scanned in the media. Reviewing the CGM trends, blood glucose are as follows:  FreeStyle Libre 3 CGM-  Sensor Download (Sensor download was reviewed and summarized below.) Dates: August 12 to August 25 , 2025 for 14 days Sensor  Average: 165 Glucose Management Indicator: 7.3%    Interpretation: Mostly acceptable blood sugar with random hyperglycemia with blood sugar up to 250 - 365 range.  Related to high carb meal and at times dessert.  No concerning hypoglycemia.  Most of the other times acceptable blood sugar in between the meals and overnight and early morning fasting.  Hypoglycemia: Patient has minor hypoglycemic episodes. Patient has hypoglycemia awareness.  Factors modifying glucose control: 1.  Diabetic diet assessment: 3 meals a day.  2.  Staying active or exercising: started to walk.  3.  Medication compliance: compliant all of the time.  # Primary hypothyroidism currently on thyroid  hormone replacement/levothyroxine  25 mcg daily.  Interval history  Diabetes regimen as reviewed above.  CGM data as reviewed above.  She had laboratory evaluation with primary care visit on August 14, reviewed in Care Everywhere.  Hemoglobin A1c was 6.7%, trending down.  Normal creatinine.  Urine microalbumin creatinine ratio normal.  TSH upper normal.  No other complaints today.  REVIEW OF SYSTEMS As per history of present illness.   PAST MEDICAL HISTORY: Past Medical History:  Diagnosis Date   Colon polyps    Depression 05/10/2013   Diabetes mellitus without complication (HCC)    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HCC)    Diverticulosis    Headache  Hypertension    Irritable bowel    Kidney disease, chronic, stage II (GFR 60-89 ml/min)    Neuropathy    Neuropathy associated with endocrine disorder (HCC)    Obesity (BMI 30-39.9)    Osteopenia    Visual disturbance     PAST SURGICAL HISTORY: Past Surgical History:  Procedure Laterality Date   arm surgery Right    plantar faci      ALLERGIES: Allergies  Allergen Reactions   Bisoprolol-Hydrochlorothiazide Swelling and Other (See Comments)    Face and throat became swollen; wheezing also   Relafen [Nabumetone] Hives and Shortness Of Breath   Aspirin   Other (See Comments)    Irritates patient's IBS and Diverticulitis   Dulaglutide  Other (See Comments)    Trulicity : Abdominal pain   Other Other (See Comments)    Seasonal allergies: Runny nose/eyes and congestion   Restasis [Cyclosporine] Itching, Rash and Other (See Comments)    Eyes burn and rash develops around the eyes    FAMILY HISTORY:  Family History  Problem Relation Age of Onset   Diabetes type II Mother    Stroke Mother    Hypertension Mother    Heart disease Father    Hypertension Father    Diabetes Father    Heart attack Father    Heart disease Brother    Hypertension Brother    Diabetes Brother    Kidney disease Brother    Diabetes Sister    Asthma Maternal Grandmother    Hypertension Maternal Grandmother    Heart attack Maternal Grandmother    Heart attack Maternal Grandfather    Heart attack Paternal Grandmother    Diabetes Paternal Grandfather    Heart attack Paternal Grandfather    Diabetes Brother    Heart attack Brother    Diabetes Brother    Diabetes Brother    Other Brother        Bright's Disease   Leukemia Other        brother's grandson   Breast cancer Neg Hx     SOCIAL HISTORY: Social History   Socioeconomic History   Marital status: Married    Spouse name: Not on file   Number of children: 3   Years of education: 12   Highest education level: High school graduate  Occupational History   Occupation: self-employed - sews  Tobacco Use   Smoking status: Never   Smokeless tobacco: Never  Vaping Use   Vaping status: Never Used  Substance and Sexual Activity   Alcohol use: No   Drug use: No   Sexual activity: Yes    Birth control/protection: Post-menopausal  Other Topics Concern   Not on file  Social History Narrative   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 Married for 38 years. Husband is a Teaching laboratory technician.   Right-handed.   2 cups caffeine  per day.   Social Drivers of Corporate investment banker Strain: Low  Risk  (01/29/2022)   Received from [facility] prior to [facility]15/2022[facility]   Overall Financial Resource Strain (CARDIA)    Difficulty of Paying Living Expenses: Not hard at all  Food Insecurity: Low Risk  (02/27/2024)   Received from [facility]   Hunger Vital Sign    Within the past 12 months, you worried that your food would run out before you got money to buy more: Never true    Within the past 12 months, the food you bought just didn't last  and you didn't have money to get more. : Never true  Transportation Needs: No Transportation Needs (02/27/2024)   Received from [facility]   Transportation    In the past 12 months, has lack of reliable transportation kept you from medical appointments, meetings, work or from getting things needed for daily living? : No  Physical Activity: Insufficiently Active (01/29/2022)   [facility] visits prior to [facility]/15/2022., [facility]   Exercise Vital Sign    On average, how many days per week do you engage in moderate to strenuous exercise (like a brisk walk)?: 3 days    On average, how many minutes do you engage in exercise at this level?: 30 min  Stress: No Stress Concern Present (01/29/2022)   Received from [facility] visits prior to 09/15/2022., [facility]   [employer] of Occupational Health - Occupational Stress Questionnaire    Feeling of Stress : Not at all  Social Connections: Moderately Isolated (01/29/2022)   Received from [facility] prior to 09/15/2022., [facility]   Social Connection and Isolation Panel    In a typical week, how many times do you talk on the phone with family, friends, or neighbors?: More than three times a week    How often do you get together with friends or relatives?: Once a week    How often do you attend church or religious services?: Never    Do you belong to any clubs or organizations  such as church groups, unions, fraternal or athletic groups, or school groups?: No    How often do you attend meetings of the clubs or organizations you belong to?: Never    Are you married, widowed, divorced, separated, never married, or living with a partner?: Married    MEDICATIONS:  Current Outpatient Medications  Medication Sig Dispense Refill   acetaminophen  (TYLENOL ) 650 MG CR tablet Take 650-1,300 mg by mouth every 8 (eight) hours as needed (for pain or headaches).      ASPIRIN  81 PO Take 1 tablet by mouth daily.     atorvastatin  (LIPITOR) 20 MG tablet Take 1 tablet (20 mg total) by mouth daily. 30 tablet 0   BIOTIN PO biotin     Blood Glucose Monitoring Suppl (ONETOUCH VERIO FLEX SYSTEM) w/Device KIT Use OneTouch Verio Flex to check blood sugar twice daily. DX: E11.49 1 kit 0   butalbital -acetaminophen -caffeine  (FIORICET, ESGIC) 50-325-40 MG tablet Take 1 tablet by mouth every 6 (six) hours as needed for headache. Do not refill in less than 30 days. 15 tablet 5   Continuous Glucose Sensor (FREESTYLE LIBRE 3 PLUS SENSOR) MISC 1 each by Does not apply route continuous. Apply every 15 days 6 each 2   Continuous Glucose Sensor (FREESTYLE LIBRE 3 SENSOR) MISC APPLY 1 SENSOR ON UPPER ARM EVERY 14 DAYS FOR CONTINUOUS GLUCOSE MONITORING 2 each 5   dapagliflozin  propanediol (FARXIGA ) 10 MG TABS tablet Take 1 tablet (10 mg total) by mouth daily. 90 tablet 3   FOLIC ACID PO Folic acid     gabapentin  (NEURONTIN ) 600 MG tablet TAKE 1 TABLET 3 TIMES PER DAY AS NEEDED 90 tablet 3   glucose blood (ONETOUCH VERIO) test strip Use OneTouch Verio test strips to test blood sugars twice daily. DX: E11.49 100 each 1   insulin  degludec (TRESIBA  FLEXTOUCH) 100 UNIT/ML FlexTouch Pen Inject 32 units under the skin once daily. 15 mL 3   insulin  lispro (HUMALOG   KWIKPEN) 100 UNIT/ML KwikPen INJECT 8 - 16 UNITS SUBCUTANEOUSLY BEFORE MEALS, maximum 40 units/day. 30 mL 4   levothyroxine  (SYNTHROID ) 25 MCG tablet Take  1 tablet (25 mcg total) by mouth daily before breakfast. 90 tablet 3   metFORMIN  (GLUCOPHAGE -XR) 500 MG 24 hr tablet TAKE 2 TABLETS BY MOUTH TWICE A DAY 360 tablet 3   methotrexate (RHEUMATREX) 2.5 MG tablet Take 2.5 mg by mouth once a week. TAKE 10 TABLETS BY MOUTH AT ONE TIME EVERY Monday.     OneTouch Delica Lancets 30G MISC Use OneTouch Delica Lancets to test blood sugars twice daily. DX: E11.49 100 each 1   PROAIR HFA 108 (90 Base) MCG/ACT inhaler Inhale 2 puffs into the lungs every 6 (six) hours as needed.  0   Semaglutide , 2 MG/DOSE, (OZEMPIC , 2 MG/DOSE,) 8 MG/3ML SOPN INJECT 2 MG SUBCUTANEOUSLY ONCE WEEKLY 9 mL 1   sertraline (ZOLOFT) 100 MG tablet Take 200 mg by mouth daily. Take 2 tablets by mouth once daily.     albuterol (PROVENTIL HFA;VENTOLIN HFA) 108 (90 Base) MCG/ACT inhaler Inhale into the lungs.     Insulin  Pen Needle 32G X 4 MM MISC Use one per day to inject victoza  30 each 5   methocarbamol (ROBAXIN) 500 MG tablet Take 1 tablet by mouth daily. (Patient not taking: Reported on 12/21/2022)     nitroGLYCERIN  (NITROSTAT ) 0.4 MG SL tablet Place 1 tablet (0.4 mg total) under the tongue every 5 (five) minutes as needed for chest pain. 30 tablet 12   No current facility-administered medications for this visit.    PHYSICAL EXAM: Vitals:   03/09/24 1315  BP: 122/82  Pulse: 66  Resp: 20  SpO2: 94%  Weight: 197 lb 9.6 oz (89.6 kg)  Height: 5' 6.5 (1.689 m)      Body mass index is 31.42 kg/m.  Wt Readings from Last 3 Encounters:  03/09/24 197 lb 9.6 oz (89.6 kg)  11/06/23 200 lb 6.4 oz (90.9 kg)  07/08/23 204 lb (92.5 kg)    General: Well developed, well nourished female in no apparent distress.  HEENT: AT/[place], no external lesions.  Eyes: Conjunctiva clear and no icterus. Neck: Neck supple  Lungs: Respirations not labored Neurologic: Alert, oriented, normal speech Extremities / Skin: Dry.  Psychiatric: Does not appear depressed or anxious  Diabetic Foot Exam - Simple    Simple Foot Form Diabetic Foot exam was performed with the following findings: Yes 03/09/2024  1:29 PM  Visual Inspection No deformities, no ulcerations, no other skin breakdown bilaterally: Yes See comments: Yes Sensation Testing Intact to touch and monofilament testing bilaterally: Yes See comments: Yes Pulse Check Posterior Tibialis and Dorsalis pulse intact bilaterally: Yes See comments: Yes Comments Callus on left great toe, and absent monofilament on right great toe, other areas intact.     LABS Reviewed Lab Results  Component Value Date   HGBA1C 6.9 (A) 11/06/2023   HGBA1C 7.2 (A) 07/08/2023   HGBA1C 7.2 (A) 04/03/2023   No results found for: FRUCTOSAMINE Lab Results  Component Value Date   CHOL 162 09/13/2020   HDL 73.20 09/13/2020   LDLCALC 71 09/13/2020   LDLDIRECT 58.0 12/25/2021   TRIG 91.0 09/13/2020   CHOLHDL 2 09/13/2020   Lab Results  Component Value Date   MICRALBCREAT <9.6 01/06/2016   Lab Results  Component Value Date   CREATININE 0.83 12/21/2022   Lab Results  Component Value Date   GFR 73.72 12/21/2022    ASSESSMENT / PLAN  1. Type 2 diabetes mellitus with diabetic neuropathy, with long-term current use of insulin  (HCC)   2. Subclinical hypothyroidism      Diabetes Mellitus type 2, complicated by diabetic neuropathy. - Diabetic status / severity: Fair control.  Lab Results  Component Value Date   HGBA1C 6.9 (A) 11/06/2023    - Hemoglobin A1c goal : <6.5%  Hemoglobin A1c was 6.7% on August 14 with PCP.  Mostly acceptable blood sugar.  She has random hyperglycemia.  Discussed about limiting carbohydrate/avoiding sugary snacks/meal.  - Medications: Below.  No change today.  -Continue Tresiba  32 units daily. - Adjust Humalog  8-16 units with meals three times a day.  Take 3 to 5 units of Humalog  for bedtime snacks when eating. -Continue Ozempic  2 mg weekly. -Continue Farxiga  10 mg daily. -Continue metformin  ER 1000 mg daily.    - Home glucose testing: Freestyle libre 3 CGM and check as needed. - Discussed/ Gave Hypoglycemia treatment plan.  # Consult : not required at this time.   # Annual urine for microalbuminuria/ creatinine ratio, no microalbuminuria currently.  Patient urine microalbumin creatinine ratio was normal with primary care provider on August 14.  02/27/2024      Albumin, Urine <7  Creatinine, Urine 103   Last  Lab Results  Component Value Date   MICRALBCREAT <9.6 01/06/2016    # Foot check nightly.  # Annual dilated diabetic eye exams.   - Diet: Make healthy diabetic food choices - Life style / activity / exercise: Discussed.  2. Blood pressure  -  BP Readings from Last 1 Encounters:  03/09/24 122/82    - Control is in target.  - No change in current plans.  3. Lipid status / Hyperlipidemia - Last  Lab Results  Component Value Date   LDLCALC 71 09/13/2020   - Continue atorvastatin  20 mg daily.  Managed by primary care provider.  # Primary hypothyroidism -Continue levothyroxine  25 mcg daily. - Recent TSH was normal.  Diagnoses and all orders for this visit:  Type 2 diabetes mellitus with diabetic neuropathy, with long-term current use of insulin  (HCC)  Subclinical hypothyroidism    DISPOSITION Follow up in clinic in 4 months suggested.    All questions answered and patient verbalized understanding of the plan.  [place] Matti Minney, MD [facility] [address] [place], [address] [place], [place] [address] Phone # [phone]  At least part of this note was generated using voice recognition software. Inadvertent word errors may have occurred, which were not recognized during the proofreading process.

## 2024-03-12 ENCOUNTER — Other Ambulatory Visit: Payer: Self-pay | Admitting: Endocrinology

## 2024-05-15 ENCOUNTER — Telehealth: Payer: Self-pay | Admitting: Pharmacy Technician

## 2024-05-15 ENCOUNTER — Other Ambulatory Visit (HOSPITAL_COMMUNITY): Payer: Self-pay

## 2024-05-15 NOTE — Telephone Encounter (Signed)
 Pharmacy Patient Advocate Encounter   Received notification from CoverMyMeds that prior authorization for Ozempic  (2 MG/DOSE) 8MG /3ML pen-injectors  is required/requested.   Insurance verification completed.   The patient is insured through [employer].   Per test claim: PA required and submitted KEY/EOC/Request #: [id number] from 04/15/24 to 05/15/25. Ran test claim, Copay is $0.00. This test claim was processed through [facility]- copay amounts may vary at other pharmacies due to pharmacy/plan contracts, or as the patient moves through the different stages of their insurance plan.

## 2024-07-14 ENCOUNTER — Encounter: Payer: Self-pay | Admitting: Endocrinology

## 2024-07-14 ENCOUNTER — Ambulatory Visit: Payer: Self-pay | Admitting: Endocrinology

## 2024-07-14 ENCOUNTER — Ambulatory Visit (INDEPENDENT_AMBULATORY_CARE_PROVIDER_SITE_OTHER): Admitting: Endocrinology

## 2024-07-14 VITALS — BP 134/78 | Ht 66.5 in | Wt 201.0 lb

## 2024-07-14 DIAGNOSIS — Z794 Long term (current) use of insulin: Secondary | ICD-10-CM | POA: Diagnosis not present

## 2024-07-14 DIAGNOSIS — E1140 Type 2 diabetes mellitus with diabetic neuropathy, unspecified: Secondary | ICD-10-CM | POA: Diagnosis not present

## 2024-07-14 DIAGNOSIS — E038 Other specified hypothyroidism: Secondary | ICD-10-CM | POA: Diagnosis not present

## 2024-07-14 LAB — POCT GLYCOSYLATED HEMOGLOBIN (HGB A1C): Hemoglobin A1C: 6.8 % — AB (ref 4.0–5.6)

## 2024-07-14 MED ORDER — LEVOTHYROXINE SODIUM 25 MCG PO TABS: 25.0000 ug | ORAL_TABLET | Freq: Every day | ORAL | 3 refills | Status: AC

## 2024-07-14 MED ORDER — METFORMIN HCL ER 500 MG PO TB24: ORAL_TABLET | ORAL | 3 refills | Status: AC

## 2024-07-14 MED ORDER — INSULIN LISPRO (1 UNIT DIAL) 100 UNIT/ML (KWIKPEN): PEN_INJECTOR | SUBCUTANEOUS | 4 refills | Status: AC

## 2024-07-14 MED ORDER — TRESIBA FLEXTOUCH 100 UNIT/ML ~~LOC~~ SOPN: PEN_INJECTOR | SUBCUTANEOUS | 3 refills | Status: AC

## 2024-07-14 MED ORDER — OZEMPIC (2 MG/DOSE) 8 MG/3ML ~~LOC~~ SOPN: PEN_INJECTOR | SUBCUTANEOUS | 3 refills | Status: AC

## 2024-07-14 NOTE — Progress Notes (Signed)
 "  Outpatient Endocrinology Note Jamie Koelzer, MD  07/14/2024  Patient's Name: [name]    DOB: July 06, 1956    MRN: [id number]                                                    REASON OF VISIT: Follow up for type 2 diabetes mellitus  PCP: Tanda Prentice DEL, MD  HISTORY OF PRESENT ILLNESS:   [name] is a 68 y.o. old female with past medical history listed below, is here for follow up of type 2 diabetes mellitus.   Pertinent Diabetes History: Patient was diagnosed with type 2 diabetes mellitus in 2010.  She has symptoms of neuropathic pain on the onset.  She was initially treated with metformin .  At some point she had taken Onlyza as well.  She had been on Trulicity  which was later changed to Victoza  because of diarrhea and abdominal pain.  She had been on Invokana  in the past as well.  She has been on insulin  therapy since June 2018.  She has relatively controlled type 2 diabetes mellitus.  Chronic Diabetes Complications : Retinopathy: no. Last ophthalmology exam was done on annually reportedly. Nephropathy: no, Peripheral neuropathy: yes, on gabapentin .  Coronary artery disease: no Stroke: no  Relevant comorbidities and cardiovascular risk factors: Obesity: yes Body mass index is 31.96 kg/m.  Hypertension: yes Hyperlipidemia. Yes, on statin  Current / Home Diabetic regimen includes: Tresiba  32 units daily. Humalog  8 - 16 units with meals three times a day. Ozempic  2 mg weekly. Farxiga  10 mg daily. Metformin  ER 1000 mg daily.   Prior diabetic medications:  Glycemic data:   Diarrhea with higher dose of metformin .  CONTINUOUS GLUCOSE MONITORING SYSTEM (CGMS) INTERPRETATION: At today's visit, we reviewed CGM downloads. The full report is scanned in the media. Reviewing the CGM trends, blood glucose are as follows:  FreeStyle Libre 3 CGM-  Sensor Download (Sensor download was reviewed and summarized below.) Dates: December 17 to July 14, 2024 for 14  days     Interpretation: Mostly acceptable blood sugar with occasional hyperglycemia up to blood sugar 250 range and rarely up to 300 range related to high carb meal.  She has rare hypoglycemia overnight and early morning with blood sugar in 60s related to not eating enough.  Most of other times acceptable blood sugar.  Hypoglycemia: Patient has minor hypoglycemic episodes. Patient has hypoglycemia awareness.  Factors modifying glucose control: 1.  Diabetic diet assessment: 3 meals a day.  2.  Staying active or exercising: started to walk.  3.  Medication compliance: compliant all of the time.  # Primary hypothyroidism currently on thyroid  hormone replacement/levothyroxine  25 mcg daily.  Interval history  Diabetes regimen as reviewed above.  CGM data as reviewed and noted above.  Hemoglobin A1c 6.8% today.  She has no new complaints.  Denies numbness and tingling to feet.  No vision problem.  REVIEW OF SYSTEMS As per history of present illness.   PAST MEDICAL HISTORY: Past Medical History:  Diagnosis Date   Colon polyps    Depression 05/10/2013   Diabetes mellitus without complication (HCC)    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HCC)    Diverticulosis    Headache    Hypertension    Irritable bowel    Kidney disease, chronic, stage II (GFR 60-89 ml/min)  Neuropathy    Neuropathy associated with endocrine disorder    Obesity (BMI 30-39.9)    Osteopenia    Visual disturbance     PAST SURGICAL HISTORY: Past Surgical History:  Procedure Laterality Date   arm surgery Right    plantar faci      ALLERGIES: Allergies  Allergen Reactions   Bisoprolol-Hydrochlorothiazide Swelling and Other (See Comments)    Face and throat became swollen; wheezing also   Relafen [Nabumetone] Hives and Shortness Of Breath   Dulaglutide  Other (See Comments)    Trulicity : Abdominal pain   Other Other (See Comments)    Seasonal allergies: Runny nose/eyes and congestion   Restasis  [Cyclosporine] Itching, Rash and Other (See Comments)    Eyes burn and rash develops around the eyes    FAMILY HISTORY:  Family History  Problem Relation Age of Onset   Diabetes type II Mother    Stroke Mother    Hypertension Mother    Heart disease Father    Hypertension Father    Diabetes Father    Heart attack Father    Heart disease Brother    Hypertension Brother    Diabetes Brother    Kidney disease Brother    Diabetes Sister    Asthma Maternal Grandmother    Hypertension Maternal Grandmother    Heart attack Maternal Grandmother    Heart attack Maternal Grandfather    Heart attack Paternal Grandmother    Diabetes Paternal Grandfather    Heart attack Paternal Grandfather    Diabetes Brother    Heart attack Brother    Diabetes Brother    Diabetes Brother    Other Brother        Bright's Disease   Leukemia Other        brother's grandson   Breast cancer Neg Hx     SOCIAL HISTORY: Social History   Socioeconomic History   Marital status: Married    Spouse name: Not on file   Number of children: 3   Years of education: 12   Highest education level: High school graduate  Occupational History   Occupation: self-employed - sews  Tobacco Use   Smoking status: Never   Smokeless tobacco: Never  Vaping Use   Vaping status: Never Used  Substance and Sexual Activity   Alcohol use: No   Drug use: No   Sexual activity: Yes    Birth control/protection: Post-menopausal  Other Topics Concern   Not on file  Social History Narrative   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 Married for 38 years. Husband is a teaching laboratory technician.   Right-handed.   2 cups caffeine  per day.   Social Drivers of Health   Tobacco Use: Low Risk (07/14/2024)   Patient History    Smoking Tobacco Use: Never    Smokeless Tobacco Use: Never    Passive Exposure: Not on file  Financial Resource Strain: Low Risk (01/29/2022)   Received from [facility]   Overall Financial Resource  Strain (CARDIA)    Difficulty of Paying Living Expenses: Not hard at all  Food Insecurity: Low Risk (02/27/2024)   Received from [facility]   Epic    Within the past 12 months, you worried that your food would run out before you got money to buy more: Never true    Within the past 12 months, the food you bought just didn't last and you didn't have money to get more. : Never true  Transportation Needs: No Transportation  Needs (02/27/2024)   Received from [facility]    In the past 12 months, has lack of reliable transportation kept you from medical appointments, meetings, work or from getting things needed for daily living? : No  Physical Activity: Insufficiently Active (01/29/2022)   Received from [facility] visits prior to 09/15/2022., [facility]   Exercise Vital Sign    On average, how many days per week do you engage in moderate to strenuous exercise (like a brisk walk)?: 3 days    On average, how many minutes do you engage in exercise at this level?: 30 min  Stress: No Stress Concern Present (01/29/2022)   Received from [facility] visits prior to 09/15/2022., [facility]   [employer] of Occupational Health - Occupational Stress Questionnaire    Feeling of Stress : Not at all  Social Connections: Moderately Isolated (01/29/2022)   Received from [facility] visits prior to 09/15/2022., [facility]   Social Connection and Isolation Panel    In a typical week, how many times do you talk on the phone with family, friends, or neighbors?: More than three times a week    How often do you get together with friends or relatives?: Once a week    How often do you attend church or religious services?: Never    Do you belong to any clubs or organizations such as church groups, unions, fraternal or athletic groups, or school groups?: No    How often do you attend meetings of the clubs or organizations you  belong to?: Never    Are you married, widowed, divorced, separated, never married, or living with a partner?: Married  Depression (PHQ2-9): Not on file  Alcohol Screen: Not on file  Housing: Low Risk (02/27/2024)   Received from [facility]   Epic    What is your living situation today?: I have a steady place to live    Think about the place you live. Do you have problems with any of the following? Choose all that apply:: None/None on this list  Utilities: Low Risk (02/27/2024)   Received from [facility]   Utilities    In the past 12 months has the electric, gas, oil, or water company threatened to shut off services in your home? : No  Health Literacy: Not on file    MEDICATIONS:  Current Outpatient Medications  Medication Sig Dispense Refill   acetaminophen  (TYLENOL ) 650 MG CR tablet Take 650-1,300 mg by mouth every 8 (eight) hours as needed (for pain or headaches).      albuterol (PROVENTIL HFA;VENTOLIN HFA) 108 (90 Base) MCG/ACT inhaler Inhale into the lungs.     ASPIRIN  81 PO Take 1 tablet by mouth daily.     atorvastatin  (LIPITOR) 20 MG tablet Take 1 tablet (20 mg total) by mouth daily. 30 tablet 0   atorvastatin  (LIPITOR) 40 MG tablet Take 40 mg by mouth daily.     BIOTIN PO biotin     Blood Glucose Monitoring Suppl (ONETOUCH VERIO FLEX SYSTEM) w/Device KIT Use OneTouch Verio Flex to check blood sugar twice daily. DX: E11.49 1 kit 0   butalbital -acetaminophen -caffeine  (FIORICET, ESGIC) 50-325-40 MG tablet Take 1 tablet by mouth every 6 (six) hours as needed for headache. Do not refill in less than 30 days. 15 tablet 5   Continuous Glucose Sensor (FREESTYLE LIBRE 3 PLUS SENSOR) MISC 1 EACH BY DOES NOT APPLY ROUTE CONTINUOUS. APPLY EVERY  15 DAYS 6 each 3   Continuous Glucose Sensor (FREESTYLE LIBRE 3 SENSOR) MISC APPLY 1 SENSOR ON UPPER ARM EVERY 14 DAYS FOR CONTINUOUS GLUCOSE MONITORING 2 each 5   dapagliflozin  propanediol (FARXIGA ) 10 MG TABS tablet Take 1 tablet (10 mg total)  by mouth daily. 90 tablet 3   FOLIC ACID PO Folic acid     gabapentin  (NEURONTIN ) 600 MG tablet TAKE 1 TABLET 3 TIMES PER DAY AS NEEDED 90 tablet 3   glucose blood (ONETOUCH VERIO) test strip Use OneTouch Verio test strips to test blood sugars twice daily. DX: E11.49 100 each 1   methotrexate (RHEUMATREX) 2.5 MG tablet Take 2.5 mg by mouth once a week. TAKE 10 TABLETS BY MOUTH AT ONE TIME EVERY Monday.     nitroGLYCERIN  (NITROSTAT ) 0.4 MG SL tablet Place 1 tablet (0.4 mg total) under the tongue every 5 (five) minutes as needed for chest pain. 30 tablet 12   OneTouch Delica Lancets 30G MISC Use OneTouch Delica Lancets to test blood sugars twice daily. DX: E11.49 100 each 1   PROAIR HFA 108 (90 Base) MCG/ACT inhaler Inhale 2 puffs into the lungs every 6 (six) hours as needed.  0   sertraline (ZOLOFT) 100 MG tablet Take 200 mg by mouth daily. Take 2 tablets by mouth once daily.     SUMAtriptan (IMITREX) 100 MG tablet Take 100 mg by mouth every 2 (two) hours as needed.     insulin  degludec (TRESIBA  FLEXTOUCH) 100 UNIT/ML FlexTouch Pen Inject 30 units under the skin once daily. 15 mL 3   insulin  lispro (HUMALOG  KWIKPEN) 100 UNIT/ML KwikPen INJECT 8 - 16 UNITS SUBCUTANEOUSLY BEFORE MEALS, maximum 40 units/day. 30 mL 4   levothyroxine  (SYNTHROID ) 25 MCG tablet Take 1 tablet (25 mcg total) by mouth daily before breakfast. 90 tablet 3   metFORMIN  (GLUCOPHAGE -XR) 500 MG 24 hr tablet TAKE 2 TABLETS BY MOUTH TWICE A DAY 360 tablet 3   Semaglutide , 2 MG/DOSE, (OZEMPIC , 2 MG/DOSE,) 8 MG/3ML SOPN INJECT 2 MG SUBCUTANEOUSLY ONCE WEEKLY 9 mL 3   No current facility-administered medications for this visit.    PHYSICAL EXAM: Vitals:   07/14/24 1329  BP: 134/78  Weight: 201 lb (91.2 kg)  Height: 5' 6.5 (1.689 m)      Body mass index is 31.96 kg/m.  Wt Readings from Last 3 Encounters:  07/14/24 201 lb (91.2 kg)  03/09/24 197 lb 9.6 oz (89.6 kg)  11/06/23 200 lb 6.4 oz (90.9 kg)    General: Well  developed, well nourished female in no apparent distress.  HEENT: AT/[place], no external lesions.  Eyes: Conjunctiva clear and no icterus. Neck: Neck supple  Lungs: Respirations not labored Neurologic: Alert, oriented, normal speech Extremities / Skin: Dry.  Psychiatric: Does not appear depressed or anxious  Diabetic Foot Exam - Simple   No data filed    LABS Reviewed Lab Results  Component Value Date   HGBA1C 6.8 (A) 07/14/2024   HGBA1C 6.9 (A) 11/06/2023   HGBA1C 7.2 (A) 07/08/2023   No results found for: FRUCTOSAMINE Lab Results  Component Value Date   CHOL 162 09/13/2020   HDL 73.20 09/13/2020   LDLCALC 71 09/13/2020   LDLDIRECT 58.0 12/25/2021   TRIG 91.0 09/13/2020   CHOLHDL 2 09/13/2020   Lab Results  Component Value Date   MICRALBCREAT <9.6 01/06/2016   Lab Results  Component Value Date   CREATININE 0.83 12/21/2022   Lab Results  Component Value Date   GFR 73.72  12/21/2022    ASSESSMENT / PLAN  1. Type 2 diabetes mellitus with diabetic neuropathy, with long-term current use of insulin  (HCC)   2. Subclinical hypothyroidism    Diabetes Mellitus type 2, complicated by diabetic neuropathy. - Diabetic status / severity: Fair control.  Lab Results  Component Value Date   HGBA1C 6.8 (A) 07/14/2024    - Hemoglobin A1c goal : <6.5%  Mostly acceptable blood sugar.  She has random hyperglycemia.  And rare hypoglycemia.  - Medications: See below.    - Decrease Tresiba  from 32 to 30 units daily.  Advised to take lower dose of Tresiba  on the days of not eating much or plan to have increased physical activities can decrease to Tresiba  20 units at 25 units for that particular day, to avoid hypoglycemia.  - Adjust Humalog  8-16 units with meals three times a day.  Take 3 to 5 units of Humalog  for bedtime snacks when eating.  -Continue Ozempic  2 mg weekly.  -Continue Farxiga  10 mg daily.  -Continue metformin  ER 1000 mg daily.   - Home glucose testing:  Freestyle libre 3 CGM and check as needed. - Discussed/ Gave Hypoglycemia treatment plan.  # Consult : not required at this time.   # Annual urine for microalbuminuria/ creatinine ratio, no microalbuminuria currently.  Patient urine microalbumin creatinine ratio was normal with primary care provider on August 14.  02/27/2024      Albumin, Urine <7  Creatinine, Urine 103   Last  Lab Results  Component Value Date   MICRALBCREAT <9.6 01/06/2016    # Foot check nightly.  # Annual dilated diabetic eye exams.   - Diet: Make healthy diabetic food choices - Life style / activity / exercise: Discussed.  2. Blood pressure  -  BP Readings from Last 1 Encounters:  07/14/24 134/78    - Control is in target.  - No change in current plans.  3. Lipid status / Hyperlipidemia - Last  Lab Results  Component Value Date   LDLCALC 71 09/13/2020   - Continue atorvastatin  20 mg daily.  Managed by primary care provider.  # Primary hypothyroidism -Continue levothyroxine  25 mcg daily.  Normal TSH in August 2025.   Angelia was seen today for diabetes.  Diagnoses and all orders for this visit:  Type 2 diabetes mellitus with diabetic neuropathy, with long-term current use of insulin  (HCC) -     POCT glycosylated hemoglobin (Hb A1C) -     insulin  lispro (HUMALOG  KWIKPEN) 100 UNIT/ML KwikPen; INJECT 8 - 16 UNITS SUBCUTANEOUSLY BEFORE MEALS, maximum 40 units/day. -     levothyroxine  (SYNTHROID ) 25 MCG tablet; Take 1 tablet (25 mcg total) by mouth daily before breakfast. -     metFORMIN  (GLUCOPHAGE -XR) 500 MG 24 hr tablet; TAKE 2 TABLETS BY MOUTH TWICE A DAY -     Semaglutide , 2 MG/DOSE, (OZEMPIC , 2 MG/DOSE,) 8 MG/3ML SOPN; INJECT 2 MG SUBCUTANEOUSLY ONCE WEEKLY  Subclinical hypothyroidism  Other orders -     insulin  degludec (TRESIBA  FLEXTOUCH) 100 UNIT/ML FlexTouch Pen; Inject 30 units under the skin once daily.    DISPOSITION Follow up in clinic in 4 months suggested.  Labs on the  same day of the visit.   All questions answered and patient verbalized understanding of the plan.  Darnell Stimson, MD [facility] Endocrinology [facility] [address] [place], [address] [place], [place] [address] Phone # [phone]  At least part of this note was generated using voice recognition software. Inadvertent  word errors may have occurred, which were not recognized during the proofreading process. "

## 2024-07-15 ENCOUNTER — Other Ambulatory Visit: Payer: Self-pay | Admitting: Endocrinology

## 2024-07-15 DIAGNOSIS — E1165 Type 2 diabetes mellitus with hyperglycemia: Secondary | ICD-10-CM

## 2024-11-10 ENCOUNTER — Ambulatory Visit: Admitting: Endocrinology
